# Patient Record
Sex: Male | Born: 1975 | Race: White | Hispanic: No | Marital: Married | Smoking: Never smoker
Health system: Southern US, Community
[De-identification: ages and names within clinical notes are randomized; demographics above are authoritative.]

---

## 2008-03-24 ENCOUNTER — Ambulatory Visit (HOSPITAL_BASED_OUTPATIENT_CLINIC_OR_DEPARTMENT_OTHER): Admission: RE | Admit: 2008-03-24 | Discharge: 2008-03-24 | Payer: Self-pay | Admitting: Orthopedic Surgery

## 2011-03-28 NOTE — Op Note (Signed)
NAMEJAIRE, PINKHAM              ACCOUNT NO.:  [id number]   MEDICAL RECORD NO.:  [id number]          PATIENT TYPE:  AMB   LOCATION:  DSC                          FACILITY:  [facility]   PHYSICIAN:  Katy Fitch. Sypher, M.D. DATE OF BIRTH:  1976-01-02   DATE OF PROCEDURE:  03/24/2008  DATE OF DISCHARGE:                               OPERATIVE REPORT   PREOPERATIVE DIAGNOSES:  1. 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  2. MRI proven rotator cuff tendinopathy.   POSTOPERATIVE DIAGNOSES:  1. 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  2. MRI proven rotator cuff tendinopathy.  3. Identification of significant acromioclavicular degenerative      change.  4. Unfavorable acromioclavicular anatomy.  5. Prominent anterior fraying of the bursal surface of the rotator      cuff.   OPERATION:  1. Examination of right shoulder under anesthesia.  2. Diagnostic arthroscopy of right glenohumeral joint.  3. Subacromial decompression with bursectomy, coracoacromial ligament      relaxation and acromioplasty.  4. Arthroscopic distal clavicle resection en bloc.   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  He has had a history of chronic right shoulder pain with  loss of motion and elevation, across the torso adduction and abduction.  He had a history of playing throwing sports throughout his secondary  school and college career and has had quite a bit of exposure to golf.   During the past months, he has had increasing pain, difficulty sleeping  on the shoulder at night, loss of range of motion and weakness of  abduction.   Clinical examination revealed signs of AC arthropathy, subacromial  impingement, sub AC joint impingement, and rotator cuff weakness.  An  MRI documented unfavorable AC anatomy, acromial  anatomy, and  tendinopathy of the rotator cuff.   After informed consent, he is brought to the operating room at this  time.   PROCEDURE:  Brick Ketcher is brought to the operating room and placed  in supine position on the operating table.   Following an anesthesia consult by Dr. Noreene Larsson, general anesthesia by  endotracheal technique was recommended as well as an interscalene block.   Dr. Noreene Larsson placed an interscalene block in the holding area without  complication.   Mr. Hocker was then brought to room 6, placed in supine position on the  operating table and under Dr. Morley Kos direct supervision, general  endotracheal anesthesia induced.   He was carefully positioned in the beach-chair position with the aid of  a torso and head holder designed for shoulder arthroscopy.   The entire right upper extremity forequarter was prepped with DuraPrep  and draped with impervious arthroscopy drapes.   The procedure commenced with examination of the shoulder under  anesthesia.  There was no evidence of instability.   The scope was then introduced through a standard posterior viewing  portal.  Diagnostic arthroscopy revealed intact hyaline articular  cartilage surfaces on the humeral  head and glenoid.  The long head of  the biceps origin was normal.  The labrum was normal.  The anterior  capsular ligaments were normal and the deep surface of the rotator cuff  including the subscapularis, supraspinatus, infraspinatus and teres  minor were all noted to be normal.  The inferior recess was normal.   The scope was removed from the glenohumeral joint and placed in the  subacromial space.   A florid bursitis was noted with multiple webs of hypertrophic bursa.  These were documented with a digital camera followed by removal with a  suction shaver.   The anatomy of the coracoacromial arch was studied.  There was  unfavorable AC anatomy with an inferiorly prominent distal clavicle and  an  inferior anterolateral acromion.   The coracoacromial ligament was relaxed with a cutting cautery followed  by leveling of the acromion to a type 1 morphology.  After bursectomy,  the distal clavicle was exposed and the distal 15 mm clavicle was  removed arthroscopically.  Hemostasis was achieved with bipolar cautery  followed by hemostasis of the cuff.  The rough area of the cuff beneath  the acromion and AC joint was then debrided to a smooth margin with a  suction shaver.  Photographic documentation of the rough tendon, as well  as the subsequent completed debridement was accomplished with a digital  camera.   The final decompression was documented with the camera as well.   Mr. Puig surgery was uncomplicated.  Hemostasis was achieved with  bipolar cautery followed by removal of the arthroscopic equipment and  repair of the portals with mattress sutures of 3-0 Prolene.   There were no apparent complications.   Mr. Janek tolerated the surgery and anesthesia well.  He was  transferred to the recovery room with stable vital signs.   He will be discharged to the care of his family with prescription for  Dilaudid 2 mg one to two tablets p.o. q 4-6 h. p.r.n. pain, #30 tablets  without refill.  Also, Motrin 600 mg one p.o. q.6 h. p.r.n. pain with  food with also one refill.  Also, Keflex 500 mg one p.o. q.8 h. x4 days  as a prophylactic antibiotic.      Katy Fitch Sypher, M.D.  Electronically Signed     RVS/MEDQ  D:  03/24/2008  T:  03/25/2008  Job:  [id number]

## 2016-08-04 ENCOUNTER — Other Ambulatory Visit: Payer: Self-pay | Admitting: Family Medicine

## 2016-08-04 DIAGNOSIS — R202 Paresthesia of skin: Principal | ICD-10-CM

## 2016-08-04 DIAGNOSIS — R200 Anesthesia of skin: Secondary | ICD-10-CM

## 2016-08-08 ENCOUNTER — Other Ambulatory Visit: Payer: Self-pay | Admitting: Family Medicine

## 2016-08-08 ENCOUNTER — Ambulatory Visit
Admission: RE | Admit: 2016-08-08 | Discharge: 2016-08-08 | Disposition: A | Discharge status: Home / Self Care | Payer: BLUE CROSS/BLUE SHIELD | Source: Ambulatory Visit | Attending: Family Medicine | Admitting: Family Medicine

## 2016-08-08 DIAGNOSIS — M542 Cervicalgia: Secondary | ICD-10-CM

## 2016-08-08 DIAGNOSIS — R200 Anesthesia of skin: Secondary | ICD-10-CM

## 2016-08-08 DIAGNOSIS — M25511 Pain in right shoulder: Secondary | ICD-10-CM

## 2016-08-08 DIAGNOSIS — R202 Paresthesia of skin: Secondary | ICD-10-CM

## 2016-08-09 ENCOUNTER — Other Ambulatory Visit: Payer: Self-pay

## 2016-08-20 ENCOUNTER — Other Ambulatory Visit: Payer: BLUE CROSS/BLUE SHIELD

## 2016-08-25 ENCOUNTER — Ambulatory Visit
Admission: RE | Admit: 2016-08-25 | Discharge: 2016-08-25 | Disposition: A | Discharge status: Home / Self Care | Payer: BLUE CROSS/BLUE SHIELD | Source: Ambulatory Visit | Attending: Family Medicine | Admitting: Family Medicine

## 2016-08-25 ENCOUNTER — Other Ambulatory Visit: Payer: Self-pay | Admitting: Family Medicine

## 2016-08-25 DIAGNOSIS — R202 Paresthesia of skin: Principal | ICD-10-CM

## 2016-08-25 DIAGNOSIS — M7541 Impingement syndrome of right shoulder: Secondary | ICD-10-CM

## 2016-08-25 DIAGNOSIS — R200 Anesthesia of skin: Secondary | ICD-10-CM

## 2016-11-08 ENCOUNTER — Other Ambulatory Visit: Payer: Self-pay | Admitting: Neurosurgery

## 2016-11-08 DIAGNOSIS — M5023 Other cervical disc displacement, cervicothoracic region: Secondary | ICD-10-CM

## 2016-11-10 ENCOUNTER — Ambulatory Visit
Admission: RE | Admit: 2016-11-10 | Discharge: 2016-11-10 | Disposition: A | Discharge status: Home / Self Care | Payer: BLUE CROSS/BLUE SHIELD | Source: Ambulatory Visit | Attending: Neurosurgery | Admitting: Neurosurgery

## 2016-11-10 DIAGNOSIS — M5023 Other cervical disc displacement, cervicothoracic region: Secondary | ICD-10-CM

## 2016-11-10 MED ORDER — METHYLPREDNISOLONE ACETATE 40 MG/ML INJ SUSP (RADIOLOG
120.0000 mg | Freq: Once | INTRAMUSCULAR | Status: AC
  Administered 2016-11-10: 120 mg via EPIDURAL

## 2016-11-10 MED ORDER — IOPAMIDOL (ISOVUE-M 200) INJECTION 41%
1.0000 mL | Freq: Once | INTRAMUSCULAR | Status: AC
  Administered 2016-11-10: 1 mL via EPIDURAL

## 2016-11-10 NOTE — Discharge Instructions (Signed)

## 2016-12-14 ENCOUNTER — Other Ambulatory Visit: Payer: Self-pay | Admitting: Neurosurgery

## 2016-12-14 DIAGNOSIS — M25511 Pain in right shoulder: Secondary | ICD-10-CM

## 2016-12-18 ENCOUNTER — Ambulatory Visit
Admission: RE | Admit: 2016-12-18 | Discharge: 2016-12-18 | Disposition: A | Discharge status: Home / Self Care | Payer: BLUE CROSS/BLUE SHIELD | Source: Ambulatory Visit | Attending: Neurosurgery | Admitting: Neurosurgery

## 2016-12-18 DIAGNOSIS — M25511 Pain in right shoulder: Secondary | ICD-10-CM

## 2017-05-16 IMAGING — MR MR CERVICAL SPINE W/O CM
4 of 5 series · 28 of 48 positions shown · non-contrast
Comparison: None.

CLINICAL DATA: Right-sided neck pain. Shoulder pain and triceps
pain.

EXAM:
MRI CERVICAL SPINE WITHOUT CONTRAST
TECHNIQUE: Multiplanar, multisequence MR imaging of the cervical spine was
performed. No intravenous contrast was administered.

[Series 6: T1 · sagittal · 3.0mm · 0.66mm/px · 7 of 15 slices shown]
[im 1/15]
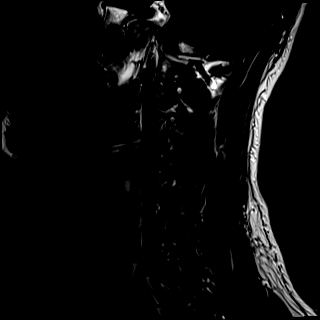
[im 3/15]
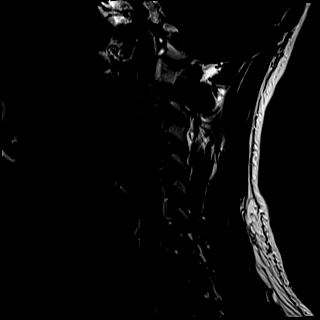
[im 5/15]
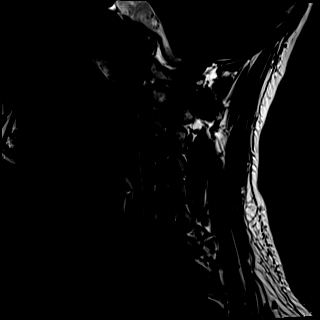
[im 8/15]
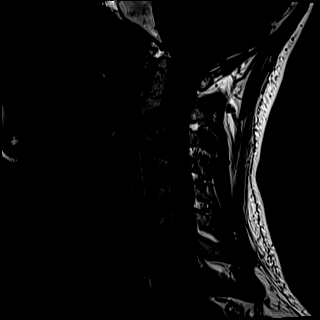
[im 10/15]
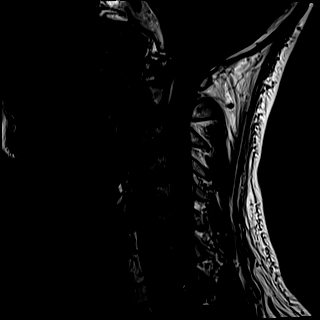
[im 12/15]
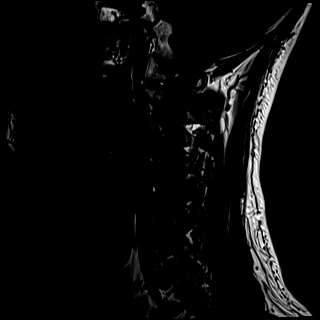
[im 15/15]
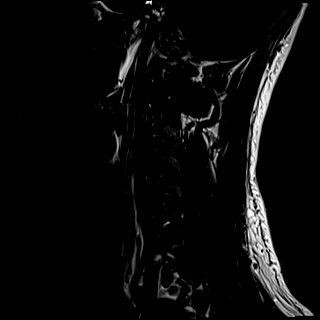

[Series 7: T2 · sagittal · 3.0mm · 0.55mm/px · 7 of 15 slices shown (1 of 2)]
[im 1/15]
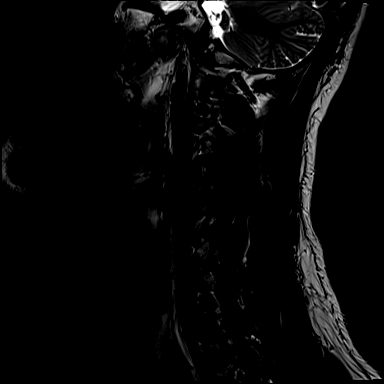
[im 3/15]
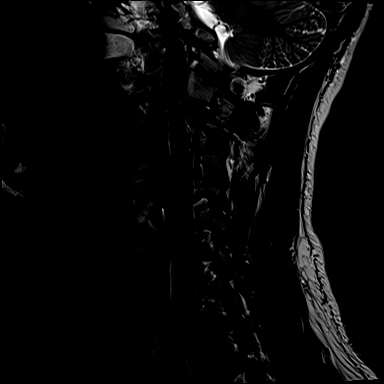
[im 5/15]
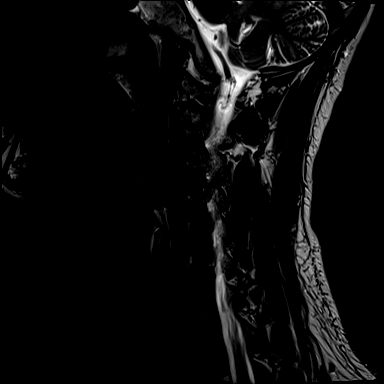
[im 8/15]
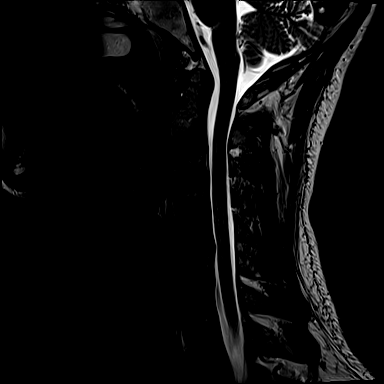
[im 10/15]
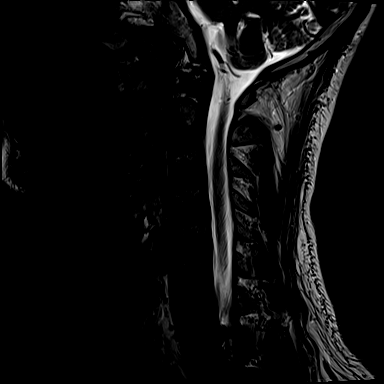
[im 12/15]
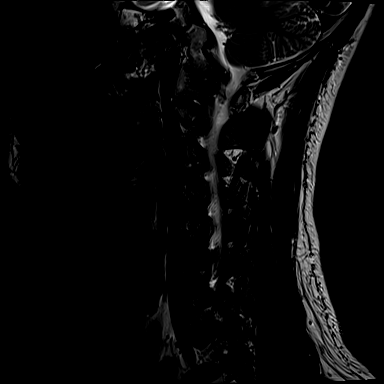
[im 15/15]
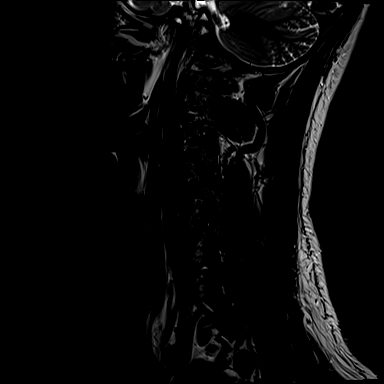

[Series 8: STIR · sagittal · 3.0mm · 0.33mm/px · 6 of 15 slices shown]
[im 1/15]
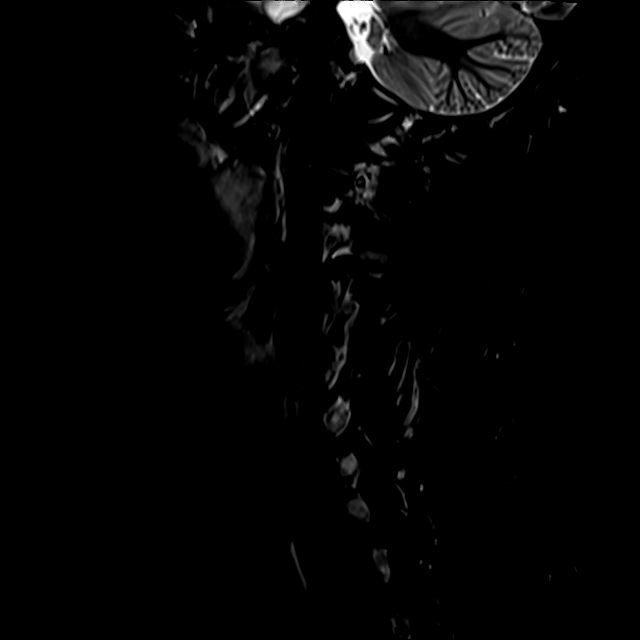
[im 3/15]
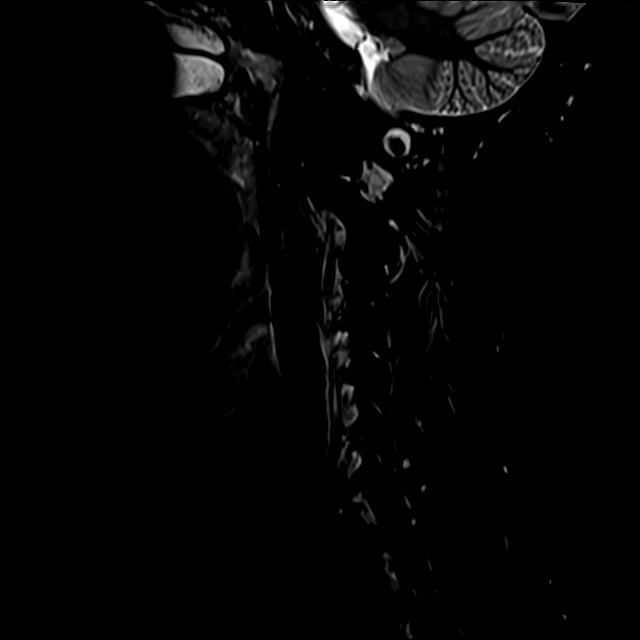
[im 6/15]
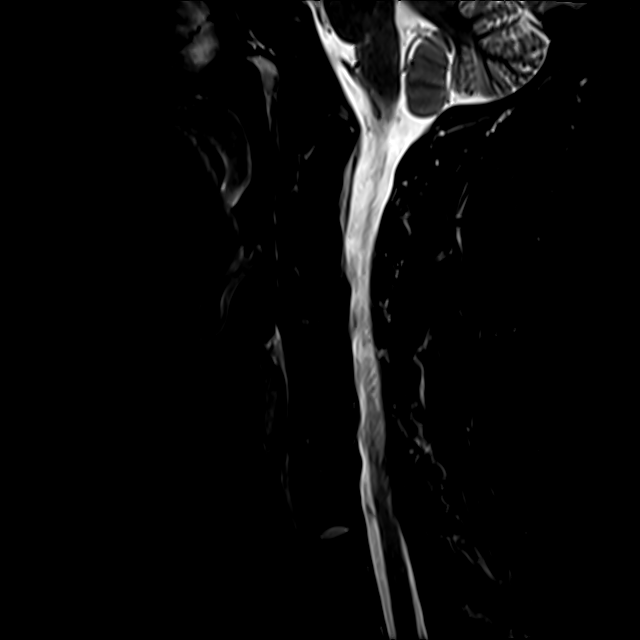
[im 9/15]
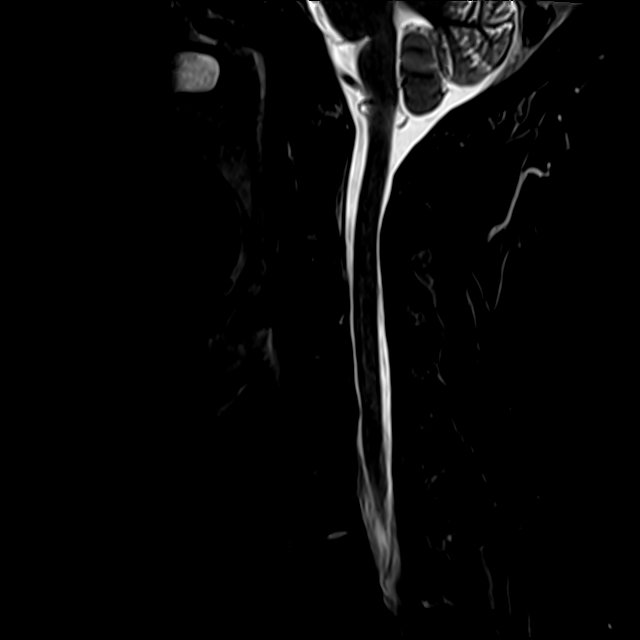
[im 12/15]
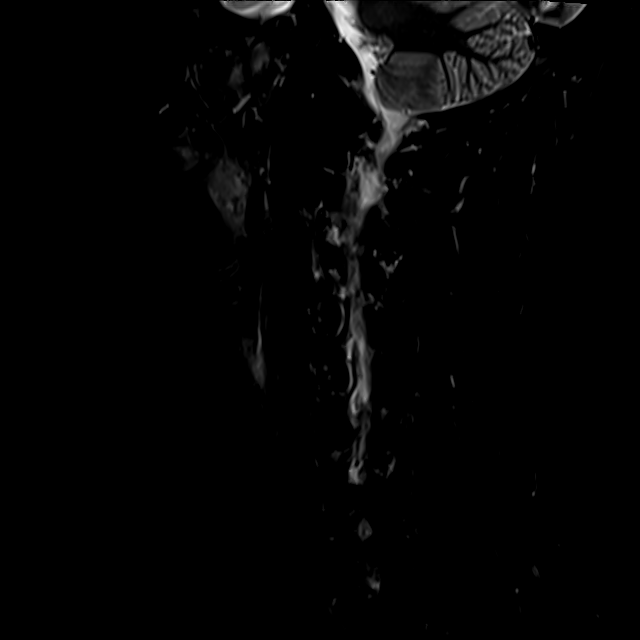
[im 15/15]
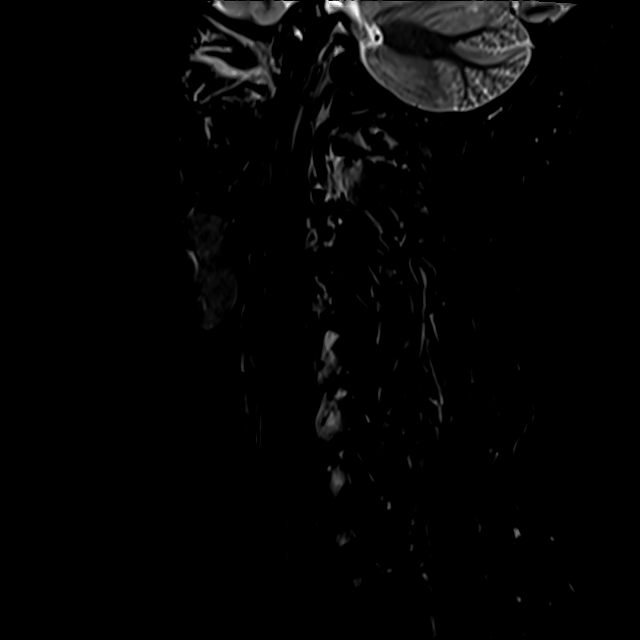

[Series 9: T2 · axial · 3.0mm · 0.50mm/px · z∈[-51,+62]mm · 8 of 33 slices shown (2 of 2)]
[im 1/33]
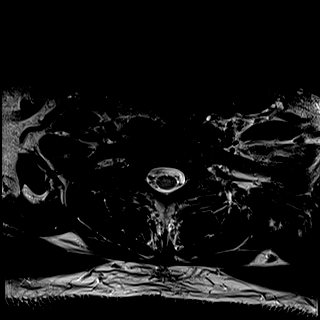
[im 5/33]
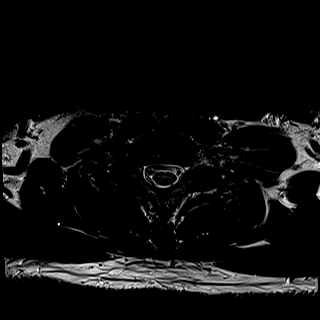
[im 10/33]
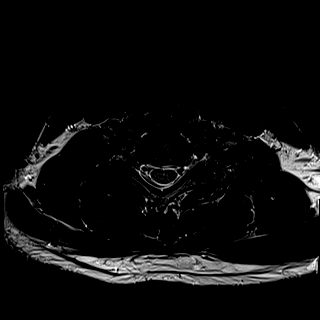
[im 15/33]
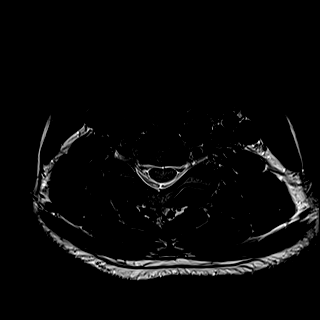
[im 18/33]
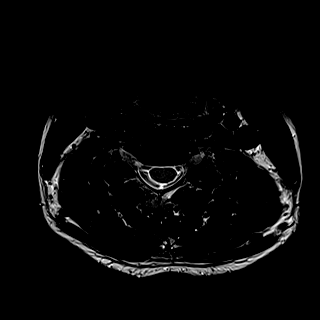
[im 23/33]
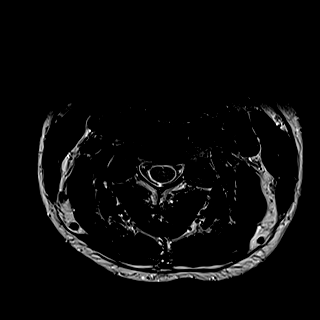
[im 28/33]
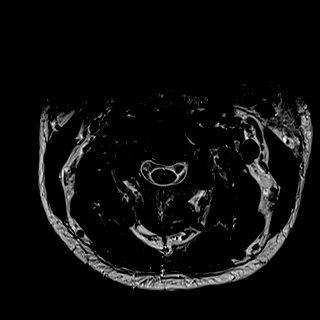
[im 33/33]
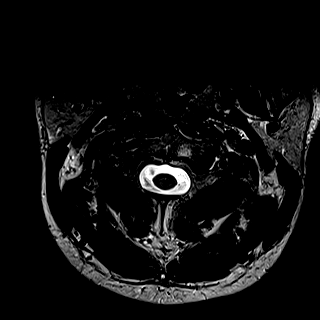

[28 of 48 positions shown; findings below may reference images not displayed]

FINDINGS: Alignment: Physiologic.

Vertebrae: No fracture, evidence of discitis, or bone lesion.

Cord: Normal signal and morphology.

Posterior Fossa, vertebral arteries, paraspinal tissues: Negative.

Disc levels:

Discs: Disc spaces are preserved.

C2-3: No significant disc bulge. No neural foraminal stenosis. No
central canal stenosis.

C3-4: No significant disc bulge. No neural foraminal stenosis. No
central canal stenosis.

C4-5: No significant disc bulge. No neural foraminal stenosis. No
central canal stenosis.

C5-6: Broad-based disc bulge with a small central disc protrusion.
No neural foraminal stenosis. No central canal stenosis.

C6-7: No significant disc bulge. No neural foraminal stenosis. No
central canal stenosis.

C7-T1: No significant disc bulge. No neural foraminal stenosis. No
central canal stenosis.
IMPRESSION: 1. At C5-6 there is a broad-based disc bulge with a small central
disc protrusion without foraminal or central canal stenosis.

## 2020-12-13 ENCOUNTER — Ambulatory Visit: Payer: PRIVATE HEALTH INSURANCE | Attending: Physical Medicine and Rehabilitation | Admitting: Physical Therapy

## 2020-12-13 ENCOUNTER — Other Ambulatory Visit: Payer: Self-pay

## 2020-12-13 ENCOUNTER — Encounter: Payer: Self-pay | Admitting: Physical Therapy

## 2020-12-13 DIAGNOSIS — M545 Low back pain, unspecified: Secondary | ICD-10-CM | POA: Insufficient documentation

## 2020-12-13 DIAGNOSIS — G8929 Other chronic pain: Secondary | ICD-10-CM | POA: Diagnosis present

## 2020-12-13 DIAGNOSIS — M25511 Pain in right shoulder: Secondary | ICD-10-CM | POA: Diagnosis present

## 2020-12-13 DIAGNOSIS — M542 Cervicalgia: Secondary | ICD-10-CM | POA: Diagnosis present

## 2020-12-13 NOTE — Patient Instructions (Signed)
Access Code: [email or web address] URL: [email or web address] Date: 12/13/2020 Prepared by: Dwana Curd  Exercises Seated Shoulder W External Rotation on Swiss Ball - 1 x daily - 7 x weekly - 3 sets - 10 reps  Patient Education Office Posture

## 2020-12-13 NOTE — Therapy (Addendum)
[facility] [address], [address] [place], [place], [address] Phone: [phone]   Fax:  [phone]  Physical Therapy Evaluation  Patient Details  Name: Keith Montoya MRN: [id number] Date of Birth: 1976/10/23 Referring Provider (PT): Romero Belling, MD   Encounter Date: 12/13/2020   PT End of Session - 12/13/20 2031    Visit Number 1    Date for PT Re-Evaluation 02/07/21    Authorization Type UHC    PT Start Time 1620    PT Stop Time 1705    PT Time Calculation (min) 45 min    Activity Tolerance Patient tolerated treatment well    Behavior During Therapy [facility] for tasks assessed/performed           History reviewed. No pertinent past medical history.  History reviewed. No pertinent surgical history.  There were no vitals filed for this visit.    Subjective Assessment - 12/13/20 1629    Subjective Pt has pain Rt shoulder with overhead like serving when playing tennis.  Pain in hip/low back when standing    Limitations Standing;Other (comment)    Diagnostic tests --    Currently in Pain? Yes    Pain Score 3     Pain Location Neck    Pain Orientation Right    Pain Descriptors / Indicators Nagging;Discomfort    Pain Type Chronic pain    Pain Onset More than a month ago    Pain Frequency Intermittent    Aggravating Factors  overhead and working and sitting aggravates the shoulder, standing is worst for the hip    Pain Relieving Factors sitting    Effect of Pain on Daily Activities standing and overhead activities tennis    Multiple Pain Sites No              [facility] PT Assessment - 12/13/20 0001      Assessment   Medical Diagnosis rt shoulder pain w/ supraspinatus tendinosis, cervicalagia, and left lumbar spondylolysis    Referring Provider (PT) Romero Belling, MD    Onset Date/Surgical Date --   ongoing   Hand Dominance Right    Prior Therapy a few years ago      Precautions   Precautions None       Balance Screen   Has the patient fallen in the past 6 months No      Home Environment   Living Environment Private residence    Living Arrangements Spouse/significant other;Children   2 children 5 and 8     Prior Function   Level of Independence Independent    Vocation Full time employment    Vocation Requirements sitting      Cognition   Overall Cognitive Status Within Functional Limits for tasks assessed      Observation/Other Assessments   Observations sits with rounded back and feet tucked under the chair    Focus on Therapeutic Outcomes (FOTO)  59% function intake; goal is 69% function      Posture/Postural Control   Posture/Postural Control Postural limitations    Postural Limitations Rounded Shoulders;Forward head;Anterior pelvic tilt   shoulder girdle slightly rotated left     ROM / Strength   AROM / PROM / Strength AROM;PROM;Strength      AROM   Overall AROM Comments fwd flex lumbar 80% limited by hamsrtring tight    AROM Assessment Site Cervical    Cervical - Right Side Bend 50    Cervical - Left Side Bend 60  Cervical - Right Rotation 50    Cervical - Left Rotation 50      PROM   Overall PROM Comments Rt IR 70%      Strength   Overall Strength Comments bilat UE 5/5      Palpation   Palpation comment upper traps and scalenes tighter on Rt side; TTP posterior Rt shoulder and stiff glenohumeral joint Rt side, tight lumbar paraspinals palpated in standing      Special Tests    Special Tests Cervical;Rotator Cuff Impingement    Cervical Tests Spurling's    Rotator Cuff Impingment tests Neer impingement test;Hawkins- Kennedy test      Spurling's   Findings Positive    Side Right    Comment "feel a lot of pressure at base of neck      Neer Impingement test    Findings Negative      Hawkins-Kennedy test   Findings Negative                      Objective measurements completed on examination: See above findings.       [facility] Adult PT  Treatment/Exercise - 12/13/20 0001      Self-Care   Self-Care Other Self-Care Comments    Other Self-Care Comments  educated and demo correct sitting posture; shoulder er with green band                  PT Education - 12/13/20 2122    Education Details Access Code: [email or web address]    Person(s) Educated Patient    Methods Explanation;Demonstration;Handout;Verbal cues    Comprehension Verbalized understanding;Returned demonstration            PT Short Term Goals - 12/13/20 2037      PT SHORT TERM GOAL #1   Title able to demonstrate and report he can maintain good posture at his desk when at work at least 35% of the time    Time 4    Period Weeks    Status New    Target Date 01/10/21      PT SHORT TERM GOAL #2   Title Pt will report 25% less pain in neck and shoulder and in back when standing for long periods of time    Time 4    Period Weeks    Status New    Target Date 01/10/21             PT Long Term Goals - 12/13/20 2012      PT LONG TERM GOAL #1   Title pt will be able to return to playing tennis due to imrpoved scpular and shoulder stability    Time 8    Period Weeks    Status New    Target Date 02/07/21      PT LONG TERM GOAL #2   Title pt will report 70% less pain when standing so he can attend events for his kids    Time 8    Period Weeks    Status New    Target Date 02/07/21      PT LONG TERM GOAL #3   Title Pt will be ind with advanced HEP for pain management and strength routine to maintain his activity level    Time 8    Period Weeks    Status New    Target Date 02/07/21      PT LONG TERM GOAL #4   Title Pt will report he can work  normal work day without feeling any neck or shoulder discomfort    Time 12    Period Weeks    Status New    Target Date 02/07/21                  Plan - 12/13/20 2042    Clinical Impression Statement Pt presents to clinic due to chronic Rt shoulder pain and stiffness that has been getting worse  recently.  He is no longer playing tennis due to pain with serving and overhead activities.  Pt is active on the weekends but sits a lot during the week at work.  Pt's posture as he demonstrates reveals tight pectoralis region with rounded shoulders and anterior pelvic tilt with tight lumbar paraspinals. He has a left slight rotation of the shoulder girdle in standing.  Posterior deltoid insertion is TTP and tricep on the Rt side.  Rt upper trap and scalenes are tight.  Rt glenohumoral joint is stiff anteriorly.  He is negative Leanord Asal and Neer impingement tests, but has a mildly positive Spurlings for some discomfort but no radicular symptoms.  Pt will benefit from skilled PT to address muscle tension and pain management as well as strengthening and stability for improved posture and functional movements without increased pain so he can return to full function.    Personal Factors and Comorbidities Time since onset of injury/illness/exacerbation;Profession;Comorbidity 2    Comorbidities subacromial bone spur removal; ulnar nerve surgery Rt side    Examination-Activity Limitations Lift;Stand    Examination-Participation Restrictions Community Activity;Occupation    Stability/Clinical Decision Making Evolving/Moderate complexity    Clinical Decision Making Moderate    Rehab Potential Excellent    PT Frequency 2x / week    PT Duration 6 weeks    PT Treatment/Interventions ADLs/Self Care Home Management;Biofeedback;Cryotherapy;Electrical Stimulation;Iontophoresis 4mg /ml Dexamethasone;Moist Heat;Traction;Ultrasound;Therapeutic activities;Neuromuscular re-education;Therapeutic exercise;Patient/family education;Manual techniques;Passive range of motion;Dry needling;Taping    PT Next Visit Plan f/u on posture at desk; dry needling to cervical and lumbar; progress scapular stability and lifting exercises; hamstring stretch    PT Home Exercise Plan Access Code: [email or web address]    Consulted and Agree with Plan  of Care Patient           Patient will benefit from skilled therapeutic intervention in order to improve the following deficits and impairments:  Increased muscle spasms,Decreased strength,Postural dysfunction,Impaired UE functional use,Decreased range of motion,Pain  Visit Diagnosis: Cervicalgia  Chronic right shoulder pain  Chronic left-sided low back pain, unspecified whether sciatica present     Problem List There are no problems to display for this patient.   , PT 12/13/2020, 9:22 PM  [facility] [address], [address] [place], [place], [place] Phone: [phone]   Fax:  [phone]  Name: Keith Montoya MRN: Keith Montoya Date of Birth: 1976/06/13

## 2020-12-14 NOTE — Addendum Note (Signed)
Addended by: Beatris Si on: 12/14/2020 08:50 AM   Modules accepted: Orders

## 2020-12-24 ENCOUNTER — Other Ambulatory Visit: Payer: Self-pay

## 2020-12-24 ENCOUNTER — Ambulatory Visit (HOSPITAL_BASED_OUTPATIENT_CLINIC_OR_DEPARTMENT_OTHER): Payer: 59 | Attending: Physical Medicine and Rehabilitation | Admitting: Physical Therapy

## 2020-12-24 DIAGNOSIS — M545 Low back pain, unspecified: Secondary | ICD-10-CM | POA: Diagnosis present

## 2020-12-24 DIAGNOSIS — M25511 Pain in right shoulder: Secondary | ICD-10-CM | POA: Insufficient documentation

## 2020-12-24 DIAGNOSIS — G8929 Other chronic pain: Secondary | ICD-10-CM | POA: Diagnosis present

## 2020-12-24 DIAGNOSIS — M542 Cervicalgia: Secondary | ICD-10-CM | POA: Insufficient documentation

## 2020-12-24 NOTE — Therapy (Signed)
[facility] [address] [place], [place], [address] Phone: [phone]   Fax:  [phone]  Physical Therapy Treatment  Patient Details  Name: Keith Montoya MRN: [id number] Date of Birth: [birth year] Referring Provider (PT): Romero Belling, MD   Encounter Date: 12/24/2020   PT End of Session - 12/24/20 0759    Visit Number 2    Number of Visits 13    Date for PT Re-Evaluation 02/07/21    Authorization Type UHC    PT Start Time 0800    PT Stop Time 0845    PT Time Calculation (min) 45 min    Activity Tolerance Patient tolerated treatment well    Behavior During Therapy [facility] for tasks assessed/performed           No past medical history on file.  No past surgical history on file.  There were no vitals filed for this visit.   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 Pt states the towel in his low back did improve.    Limitations Standing;Other (comment)    Currently in Pain? Yes    Pain Score 2     Pain Location Neck    Pain Orientation Right    Pain Descriptors / Indicators Tightness    Pain Radiating Towards Posterior shoulder    Pain Onset More than a month ago                             [facility] Adult PT Treatment/Exercise - 12/24/20 0001      Exercises   Exercises Lumbar      Lumbar Exercises: Stretches   Single Knee to Chest Stretch Right;Left;30 seconds    Piriformis Stretch Right;Left;30 seconds      Lumbar Exercises: Supine   Pelvic Tilt 5 reps    Bridge 20 reps;3 seconds   with PPT   Other Supine Lumbar Exercises Child's pose x30 sec, side bend x30 sec each side    Other Supine Lumbar Exercises Cat cow x 10 reps with 2-3 sec hold      Shoulder Exercises: Supine   Flexion 10 reps;AROM    Flexion Limitations Working on scapular stability without increased lumbar lordosis      Shoulder Exercises: Sidelying    External Rotation Strengthening;Right;20 reps;Weights    External Rotation Weight (lbs) 3 lbs    Flexion --    Other Sidelying Exercises Sleeper stretch x30 sec      Manual Therapy   Manual Therapy Joint mobilization;Soft tissue mobilization    Joint Mobilization posterior, inferior and anterior GH grade II to III mobs    Soft tissue mobilization TPR & STW upper trap, scalene, lats                    PT Short Term Goals - 12/13/20 2037      PT SHORT TERM GOAL #1   Title able to demonstrate and report he can maintain good posture at his desk when at work at least 35% of the time    Time 4    Period Weeks    Status New    Target Date 01/10/21      PT SHORT TERM GOAL #2   Title Pt will report 25% less pain in neck and shoulder and in back when standing for long periods of time  Time 4    Period Weeks    Status New    Target Date 01/10/21             PT Long Term Goals - 12/13/20 2012      PT LONG TERM GOAL #1   Title pt will be able to return to playing tennis due to imrpoved scpular and shoulder stability    Time 8    Period Weeks    Status New    Target Date 02/07/21      PT LONG TERM GOAL #2   Title pt will report 70% less pain when standing so he can attend events for his kids    Time 8    Period Weeks    Status New    Target Date 02/07/21      PT LONG TERM GOAL #3   Title Pt will be ind with advanced HEP for pain management and strength routine to maintain his activity level    Time 8    Period Weeks    Status New    Target Date 02/07/21      PT LONG TERM GOAL #4   Title Pt will report he can work normal work day without feeling any neck or shoulder discomfort    Time 12    Period Weeks    Status New    Target Date 02/07/21                 Plan - 12/24/20 1130    Clinical Impression Statement Treatment session focused on shoulder stretching, improving scapular mobility without low back compensation, and initiating low back/core  stabilization exercises. Pt appears to have tight lats that effects low back/increases lumbar lordosis when pt attempting scapular retraction. Manual therapy provided to address upper trap, scalene, posterior delt/lats.    Personal Factors and Comorbidities Time since onset of injury/illness/exacerbation;Profession;Comorbidity 2    Comorbidities subacromial bone spur removal; ulnar nerve surgery Rt side    Examination-Activity Limitations Lift;Stand    Examination-Participation Restrictions Community Activity;Occupation    Stability/Clinical Decision Making Evolving/Moderate complexity    Rehab Potential Excellent    PT Frequency 2x / week    PT Duration 6 weeks    PT Treatment/Interventions ADLs/Self Care Home Management;Biofeedback;Cryotherapy;Electrical Stimulation;Iontophoresis 4mg /ml Dexamethasone;Moist Heat;Traction;Ultrasound;Therapeutic activities;Neuromuscular re-education;Therapeutic exercise;Patient/family education;Manual techniques;Passive range of motion;Dry needling;Taping    PT Next Visit Plan manual therapy to cervical and lumbar; progress scapular stability and lifting exercises; hamstring/lumbar stretching    PT Home Exercise Plan Access Code: [email or web address]    Consulted and Agree with Plan of Care Patient           Patient will benefit from skilled therapeutic intervention in order to improve the following deficits and impairments:  Increased muscle spasms,Decreased strength,Postural dysfunction,Impaired UE functional use,Decreased range of motion,Pain  Visit Diagnosis: Cervicalgia  Chronic right shoulder pain  Chronic left-sided low back pain, unspecified whether sciatica present     Problem List There are no problems to display for this patient.   [facility] [address] PT, DPT 12/24/2020, 11:44 AM  [facility] [address] [place], [place], [place] Phone: [phone]   Fax:  [phone]  Name: Rhone Ozaki MRN: [name] Date of Birth: 24-May-1976

## 2020-12-28 ENCOUNTER — Ambulatory Visit (HOSPITAL_BASED_OUTPATIENT_CLINIC_OR_DEPARTMENT_OTHER): Payer: PRIVATE HEALTH INSURANCE | Attending: Physical Medicine and Rehabilitation | Admitting: Physical Therapy

## 2020-12-28 ENCOUNTER — Other Ambulatory Visit: Payer: Self-pay

## 2020-12-28 DIAGNOSIS — M542 Cervicalgia: Secondary | ICD-10-CM | POA: Diagnosis present

## 2020-12-28 DIAGNOSIS — M545 Low back pain, unspecified: Secondary | ICD-10-CM | POA: Diagnosis not present

## 2020-12-28 DIAGNOSIS — M25511 Pain in right shoulder: Secondary | ICD-10-CM | POA: Diagnosis not present

## 2020-12-28 DIAGNOSIS — G8929 Other chronic pain: Secondary | ICD-10-CM | POA: Diagnosis not present

## 2020-12-28 NOTE — Therapy (Signed)
[facility] [address] [place], [place], [address] Phone: [phone]   Fax:  [phone]  Physical Therapy Treatment  Patient Details  Name: Keith Montoya MRN: [id number] Date of Birth: 1976/04/07 Referring Provider (PT): Romero Belling, MD   Encounter Date: 12/28/2020   PT End of Session - 12/28/20 1654    Visit Number 3    Number of Visits 13    Date for PT Re-Evaluation 02/07/21    Authorization Type UHC    PT Start Time 1607    PT Stop Time 1650    PT Time Calculation (min) 43 min    Activity Tolerance Patient tolerated treatment well    Behavior During Therapy [facility] for tasks assessed/performed           No past medical history on file.  No past surgical history on file.  There were no vitals filed for this visit.   Subjective Assessment - 12/28/20 1610    Subjective Pt states he played pick up basketball and had an exacerbation of his left side with increased tightness and soreness. Pt states this afternoon feels a little better.    Limitations Standing;Other (comment)    Currently in Pain? Yes    Pain Score 7     Pain Location Back    Pain Orientation Left    Pain Descriptors / Indicators Tightness;Sore    Pain Type Chronic pain    Pain Onset More than a month ago                             [facility] Adult PT Treatment/Exercise - 12/28/20 0001      Posture/Postural Control   Postural Limitations Anterior pelvic tilt;Left pelvic obliquity      Lumbar Exercises: Stretches   Piriformis Stretch Right;Left;30 seconds    Other Lumbar Stretch Exercise Toe touch stretch 2x20 sec      Lumbar Exercises: Standing   Shoulder Extension Strengthening;5 reps    Shoulder Extension Limitations Attempted; however, pt with increased L hip pain with standing    Other Standing Lumbar Exercises Lateral shift x10 to L    Other Standing Lumbar Exercises Lumbar extensions x10      Lumbar Exercises:  Seated   Other Seated Lumbar Exercises Row 25# 2x10      Lumbar Exercises: Supine   Pelvic Tilt 10 reps    Clam --   3x10 sec hold isometrics   Bridge 10 reps;3 seconds      Manual Therapy   Manual Therapy Joint mobilization;Muscle Energy Technique    Joint Mobilization manual posterior rotation of L innominate and sacral distraction    Muscle Energy Technique encouraging posterior rotation of L innominate                    PT Short Term Goals - 12/13/20 2037      PT SHORT TERM GOAL #1   Title able to demonstrate and report he can maintain good posture at his desk when at work at least 35% of the time    Time 4    Period Weeks    Status New    Target Date 01/10/21      PT SHORT TERM GOAL #2   Title Pt will report 25% less pain in neck and shoulder and in back when standing for long periods of time    Time 4    Period Weeks  Status New    Target Date 01/10/21             PT Long Term Goals - 12/13/20 2012      PT LONG TERM GOAL #1   Title pt will be able to return to playing tennis due to imrpoved scpular and shoulder stability    Time 8    Period Weeks    Status New    Target Date 02/07/21      PT LONG TERM GOAL #2   Title pt will report 70% less pain when standing so he can attend events for his kids    Time 8    Period Weeks    Status New    Target Date 02/07/21      PT LONG TERM GOAL #3   Title Pt will be ind with advanced HEP for pain management and strength routine to maintain his activity level    Time 8    Period Weeks    Status New    Target Date 02/07/21      PT LONG TERM GOAL #4   Title Pt will report he can work normal work day without feeling any neck or shoulder discomfort    Time 12    Period Weeks    Status New    Target Date 02/07/21                 Plan - 12/28/20 1701    Clinical Impression Statement Pt returns to PT with increased L back/hip pain/soreness after playing basketball. Pt found to have asymmetric  pelvic height (L higher than R in standing); shortened L LE in supine and then symmetrical with R LE with long sitting. Treatment focused on realigning pelvis and improving lumbar/SI joint flexibility and stability. Pt appeared to respond well to McKenzie extension and lateral shift exercises -- PT providing a trial course. Reviewed stretches for R shoulder and reiterated stretching his lats and decreasing lumbar lordosis with shoulder/scapular exercises.    Personal Factors and Comorbidities Time since onset of injury/illness/exacerbation;Profession;Comorbidity 2    Comorbidities subacromial bone spur removal; ulnar nerve surgery Rt side    Examination-Activity Limitations Lift;Stand    Examination-Participation Restrictions Community Activity;Occupation    Stability/Clinical Decision Making Evolving/Moderate complexity    Rehab Potential Excellent    PT Frequency 2x / week    PT Duration 6 weeks    PT Treatment/Interventions ADLs/Self Care Home Management;Biofeedback;Cryotherapy;Electrical Stimulation;Iontophoresis 4mg /ml Dexamethasone;Moist Heat;Traction;Ultrasound;Therapeutic activities;Neuromuscular re-education;Therapeutic exercise;Patient/family education;Manual techniques;Passive range of motion;Dry needling;Taping    PT Next Visit Plan How was McKenzie type extensions? Continue to address pelvic instability. manual therapy to cervical and lumbar; progress scapular stability and lifting exercises; hamstring/lumbar stretching. Consider quadruped (bird dog, reverse bird dog, etc.) or sitting on physioball    PT Home Exercise Plan Access Code: [email or web address]    Consulted and Agree with Plan of Care Patient           Patient will benefit from skilled therapeutic intervention in order to improve the following deficits and impairments:  Increased muscle spasms,Decreased strength,Postural dysfunction,Impaired UE functional use,Decreased range of motion,Pain  Visit Diagnosis: Cervicalgia  Chronic  right shoulder pain  Chronic left-sided low back pain, unspecified whether sciatica present     Problem List There are no problems to display for this patient.   [facility] [address] PT, DPT 12/28/2020, 5:11 PM  [facility] [address] [place], [place], [place] Phone: [phone]   Fax:  [phone]  Name: Keith Montoya MRN: [id number] Date of Birth: [birth year]

## 2020-12-31 ENCOUNTER — Other Ambulatory Visit: Payer: Self-pay

## 2020-12-31 ENCOUNTER — Ambulatory Visit (HOSPITAL_BASED_OUTPATIENT_CLINIC_OR_DEPARTMENT_OTHER): Payer: PRIVATE HEALTH INSURANCE | Attending: Physical Medicine and Rehabilitation | Admitting: Physical Therapy

## 2020-12-31 DIAGNOSIS — M545 Low back pain, unspecified: Secondary | ICD-10-CM | POA: Diagnosis present

## 2020-12-31 DIAGNOSIS — G8929 Other chronic pain: Secondary | ICD-10-CM | POA: Diagnosis present

## 2020-12-31 DIAGNOSIS — M542 Cervicalgia: Secondary | ICD-10-CM | POA: Insufficient documentation

## 2020-12-31 DIAGNOSIS — M25511 Pain in right shoulder: Secondary | ICD-10-CM | POA: Diagnosis present

## 2020-12-31 NOTE — Therapy (Signed)
[place] [address] [place], [place], [address] Phone: [phone]   Fax:  [phone]  Physical Therapy Treatment  Patient Details  Name: Keith Montoya MRN: [id number] Date of Birth: [birth year] Referring Provider (PT): Keith Apple, MD   Encounter Date: 12/31/2020   PT End of Session - 12/31/20 1709    Visit Number 4    Number of Visits 13    Date for PT Re-Evaluation 02/07/21    Authorization Type UHC    PT Start Time 2924    PT Stop Time 1645    PT Time Calculation (min) 50 min    Activity Tolerance Patient tolerated treatment well    Behavior During Therapy [facility] for tasks assessed/performed           No past medical history on file.  No past surgical history on file.  There were no vitals filed for this visit.   Subjective Assessment - 12/31/20 1701    Subjective Pt reports he has had continued flare up of his L hip and L lateral leg. Pt also feels his R shoulder tightened up as well. Pt has attempted to use his massage gun to get trigger points along line of his IT band.    Limitations Standing;Other (comment)    Currently in Pain? Yes    Pain Score 7     Pain Location Hip    Pain Orientation Left    Pain Descriptors / Indicators Tightness;Sore    Pain Type Chronic pain    Pain Onset More than a month ago                             [facility] Adult PT Treatment/Exercise - 12/31/20 0001      Lumbar Exercises: Stretches   Hip Flexor Stretch Left;60 seconds   Thomas stretch while performing manual therapy     Lumbar Exercises: Supine   Pelvic Tilt 10 reps    Other Supine Lumbar Exercises Marching x10 bilat    Other Supine Lumbar Exercises Deadbug with alternating arm movement 2x10   Difficulty maintaining PPT with R shoulder flexion & L hip march     Lumbar Exercises: Sidelying   Hip Abduction Left;10 reps      Manual Therapy   Manual therapy comments IASTM L ITB    Soft tissue  mobilization TPR L vastus lateralis underneath ITB, TFL and glute max    Muscle Energy Technique MET for TFL                  PT Education - 12/31/20 1702    Education Details Discussed IT band stretches, hip flexor stretches, and hip compensation with core weakness. Discussed using weight lifting belt or SI belt for added core stability when he performs leisure activities such as pickleball and basketball.    Person(s) Educated Patient    Methods Explanation;Demonstration    Comprehension Verbalized understanding;Returned demonstration            PT Short Term Goals - 12/13/20 2037      PT SHORT TERM GOAL #1   Title able to demonstrate and report he can maintain good posture at his desk when at work at least 35% of the time    Time 4    Period Weeks    Status New    Target Date 01/10/21      PT SHORT TERM GOAL #2   Title Pt will  report 25% less pain in neck and shoulder and in back when standing for long periods of time    Time 4    Period Weeks    Status New    Target Date 01/10/21             PT Long Term Goals - 12/13/20 2012      PT LONG TERM GOAL #1   Title pt will be able to return to playing tennis due to imrpoved scpular and shoulder stability    Time 8    Period Weeks    Status New    Target Date 02/07/21      PT LONG TERM GOAL #2   Title pt will report 70% less pain when standing so he can attend events for his kids    Time 8    Period Weeks    Status New    Target Date 02/07/21      PT LONG TERM GOAL #3   Title Pt will be ind with advanced HEP for pain management and strength routine to maintain his activity level    Time 8    Period Weeks    Status New    Target Date 02/07/21      PT LONG TERM GOAL #4   Title Pt will report he can work normal work day without feeling any neck or shoulder discomfort    Time 12    Period Weeks    Status New    Target Date 02/07/21                 Plan - 12/31/20 1704    Clinical Impression  Statement Treatment focused on addressing pt's L hip pain with manual therapy as needed. Pt did not report a huge improvement with performing L lateral hip shifts at home. Rest of session worked on Event organiser of his TA without hip compensation. Pt continues to have difficulty maintaining PPT with alternating UE and LE movement. Discussed potential use of weight lifting belt as an added core stabilizer when he is performing his leisure activities to decrease his hip flare ups.    Personal Factors and Comorbidities Time since onset of injury/illness/exacerbation;Profession;Comorbidity 2    Comorbidities subacromial bone spur removal; ulnar nerve surgery Rt side    Examination-Activity Limitations Lift;Stand    Examination-Participation Restrictions Community Activity;Occupation    Stability/Clinical Decision Making Evolving/Moderate complexity    Rehab Potential Excellent    PT Frequency 2x / week    PT Duration 6 weeks    PT Treatment/Interventions ADLs/Self Care Home Management;Biofeedback;Cryotherapy;Electrical Stimulation;Iontophoresis 36m/ml Dexamethasone;Moist Heat;Traction;Ultrasound;Therapeutic activities;Neuromuscular re-education;Therapeutic exercise;Patient/family education;Manual techniques;Passive range of motion;Dry needling;Taping    PT Next Visit Plan How was McKenzie type extensions? Continue to address pelvic instability. manual therapy to cervical, lumbar, and hip as needed. Progress scapular stability and lifting exercises. Discuss pelvic stability with standing. Continue to work on core/pelvic stabilization without hip compensation. Consider quadruped (bird dog, reverse bird dog, etc.) or sitting on physioball    PT Home Exercise Plan Access Code: [email or web address]   Consulted and Agree with Plan of Care Patient           Patient will benefit from skilled therapeutic intervention in order to improve the following deficits and impairments:  Increased muscle spasms,Decreased  strength,Postural dysfunction,Impaired UE functional use,Decreased range of motion,Pain  Visit Diagnosis: Cervicalgia  Chronic right shoulder pain  Chronic left-sided low back pain, unspecified whether sciatica present     Problem List There  are no problems to display for this patient.   [facility] [address] PT, DPT 12/31/2020, 5:10 PM  [facility] [address] [place], [place], [address] Phone: [phone]   Fax:  [phone]  Name: Keith Montoya MRN: [id number] Date of Birth: 1976-03-27

## 2021-01-05 ENCOUNTER — Ambulatory Visit (HOSPITAL_BASED_OUTPATIENT_CLINIC_OR_DEPARTMENT_OTHER): Payer: PRIVATE HEALTH INSURANCE | Attending: Physical Medicine and Rehabilitation | Admitting: Physical Therapy

## 2021-01-05 ENCOUNTER — Other Ambulatory Visit: Payer: Self-pay

## 2021-01-05 DIAGNOSIS — M25511 Pain in right shoulder: Secondary | ICD-10-CM | POA: Diagnosis present

## 2021-01-05 DIAGNOSIS — G8929 Other chronic pain: Secondary | ICD-10-CM | POA: Insufficient documentation

## 2021-01-05 DIAGNOSIS — M545 Low back pain, unspecified: Secondary | ICD-10-CM | POA: Insufficient documentation

## 2021-01-05 DIAGNOSIS — M542 Cervicalgia: Secondary | ICD-10-CM | POA: Insufficient documentation

## 2021-01-05 NOTE — Therapy (Signed)
[facility] [address] [place], [place], [address] Phone: [phone]   Fax:  [phone]  Physical Therapy Treatment  Patient Details  Name: Keith Montoya MRN: [id number] Date of Birth: 08/03/1976 Referring Provider (PT): Romero Belling, MD   Encounter Date: 01/05/2021   PT End of Session - 01/05/21 1704    Visit Number 5    Number of Visits 13    Date for PT Re-Evaluation 02/07/21    Authorization Type UHC    PT Start Time 1600    PT Stop Time 1645    PT Time Calculation (min) 45 min    Activity Tolerance Patient tolerated treatment well    Behavior During Therapy [facility] for tasks assessed/performed           No past medical history on file.  No past surgical history on file.  There were no vitals filed for this visit.   Subjective Assessment - 01/05/21 1606    Subjective Pt states he did a lot of walking this weekend but had to stop every few minutes due to increased pain in his L quad. Pt states the back of his R shoulder feels very tight today. Reports he is feeling the pain migrating more in the front of thigh. He has stretched the side of his L leg and that pain/pressure from last visit has let up.    Limitations Standing;Other (comment)    Currently in Pain? Yes    Pain Onset More than a month ago                             [facility] Adult PT Treatment/Exercise - 01/05/21 0001      Lumbar Exercises: Stretches   Hip Flexor Stretch Left;20 seconds    Other Lumbar Stretch Exercise R lat stretch x30 sec    Other Lumbar Stretch Exercise L quad stretch x30 sec      Lumbar Exercises: Quadruped   Other Quadruped Lumbar Exercises Reverse bird dog x10; reverse bird dog with lat pull back using orange tband x5    Other Quadruped Lumbar Exercises Bent knee hip extension with TA contraction x10      Manual Therapy   Soft tissue mobilization STM and TPR L quad and R lats                   PT Education - 01/05/21 1658    Education Details Discussed posterior oblique sling in regards to his pain and dysfunctional SI joint. Discussed heat and stretches for his muscles as well as massage gun to his trigger points along his lats.    Person(s) Educated Patient    Methods Explanation;Demonstration;Handout;Verbal cues    Comprehension Verbalized understanding;Returned demonstration;Need further instruction            PT Short Term Goals - 12/13/20 2037      PT SHORT TERM GOAL #1   Title able to demonstrate and report he can maintain good posture at his desk when at work at least 35% of the time    Time 4    Period Weeks    Status New    Target Date 01/10/21      PT SHORT TERM GOAL #2   Title Pt will report 25% less pain in neck and shoulder and in back when standing for long periods of time    Time 4    Period Weeks    Status  New    Target Date 01/10/21             PT Long Term Goals - 12/13/20 2012      PT LONG TERM GOAL #1   Title pt will be able to return to playing tennis due to imrpoved scpular and shoulder stability    Time 8    Period Weeks    Status New    Target Date 02/07/21      PT LONG TERM GOAL #2   Title pt will report 70% less pain when standing so he can attend events for his kids    Time 8    Period Weeks    Status New    Target Date 02/07/21      PT LONG TERM GOAL #3   Title Pt will be ind with advanced HEP for pain management and strength routine to maintain his activity level    Time 8    Period Weeks    Status New    Target Date 02/07/21      PT LONG TERM GOAL #4   Title Pt will report he can work normal work day without feeling any neck or shoulder discomfort    Time 12    Period Weeks    Status New    Target Date 02/07/21                 Plan - 01/05/21 1658    Clinical Impression Statement Treatment focused on stretching and providing manual therapy for pt's trigger points (this session it was L quad and R lats).  Rest of session focused on neuromuscular control of core and strengthening of his posterior oblique sling (i.e. glutes and lats).    Personal Factors and Comorbidities Time since onset of injury/illness/exacerbation;Profession;Comorbidity 2    Comorbidities subacromial bone spur removal; ulnar nerve surgery Rt side    Examination-Activity Limitations Lift;Stand    Examination-Participation Restrictions Community Activity;Occupation    Stability/Clinical Decision Making Evolving/Moderate complexity    Rehab Potential Excellent    PT Frequency 2x / week    PT Duration 6 weeks    PT Treatment/Interventions ADLs/Self Care Home Management;Biofeedback;Cryotherapy;Electrical Stimulation;Iontophoresis 4mg /ml Dexamethasone;Moist Heat;Traction;Ultrasound;Therapeutic activities;Neuromuscular re-education;Therapeutic exercise;Patient/family education;Manual techniques;Passive range of motion;Dry needling;Taping    PT Next Visit Plan Continue to address pelvic instability/posterior oblique sling. Manual therapy as needed. Continue to work on core/pelvic stabilization without hip compensation. Continue quadruped with reverse bird dog. Try wall squat with isometric lat push back against wall. Consider use of shuttle.    PT Home Exercise Plan Access Code: [email or web address]    Consulted and Agree with Plan of Care Patient           Patient will benefit from skilled therapeutic intervention in order to improve the following deficits and impairments:  Increased muscle spasms,Decreased strength,Postural dysfunction,Impaired UE functional use,Decreased range of motion,Pain  Visit Diagnosis: Cervicalgia  Chronic right shoulder pain  Chronic left-sided low back pain, unspecified whether sciatica present     Problem List There are no problems to display for this patient.   [facility] [address] [place] PT, DPT 01/05/2021, 5:09 PM  [facility] [address] [place], [place], [place] Phone: [phone]   Fax:  [phone]  Name: Arnoldo Hildreth MRN: [name] Date of Birth: 12/30/1975

## 2021-01-07 ENCOUNTER — Ambulatory Visit (HOSPITAL_BASED_OUTPATIENT_CLINIC_OR_DEPARTMENT_OTHER): Payer: PRIVATE HEALTH INSURANCE | Admitting: Physical Therapy

## 2021-01-07 DIAGNOSIS — M542 Cervicalgia: Secondary | ICD-10-CM

## 2021-01-07 DIAGNOSIS — G8929 Other chronic pain: Secondary | ICD-10-CM

## 2021-01-07 DIAGNOSIS — M545 Low back pain, unspecified: Secondary | ICD-10-CM

## 2021-01-07 NOTE — Therapy (Signed)
[facility] [address] [place], [place], [address] Phone: [phone]   Fax:  [phone]  Physical Therapy Treatment  Patient Details  Name: Keith Montoya MRN: [id number] Date of Birth: 13-Nov-1976 Referring Provider (PT): Romero Belling, MD   Encounter Date: 01/07/2021   PT End of Session - 01/07/21 1656    Visit Number 6    Number of Visits 13    Date for PT Re-Evaluation 02/07/21    Authorization Type UHC    PT Start Time 1600    PT Stop Time 1645    PT Time Calculation (min) 45 min    Activity Tolerance Patient tolerated treatment well    Behavior During Therapy [facility] for tasks assessed/performed           No past medical history on file.  No past surgical history on file.  There were no vitals filed for this visit.   Subjective Assessment - 01/07/21 1559    Subjective Pt reports quad is feeling better and R shoulder is feeling looser. L IT band is giving him issues today. Pt has been doing his IT stretches.    Limitations Standing;Other (comment)    Currently in Pain? Yes    Pain Score 7     Pain Location Hip    Pain Orientation Left    Pain Onset More than a month ago                             [facility] Adult PT Treatment/Exercise - 01/07/21 0001      Lumbar Exercises: Aerobic   Other Aerobic Exercise Sci-fit L 2 x 5 min      Lumbar Exercises: Machines for Strengthening   Other Lumbar Machine Exercise Shuttle 100# DL press with UE pull down x10; L SL press with R UE pull down 75# x10      Lumbar Exercises: Standing   Other Standing Lumbar Exercises Wall slides with PPT arms against wall x10; wall slide with PPT arms above head x10      Lumbar Exercises: Supine   Other Supine Lumbar Exercises Eccentric L hip bridge + R lat iso x10      Lumbar Exercises: Quadruped   Other Quadruped Lumbar Exercises Bent knee hip extension with TA contraction x10      Manual Therapy   Soft tissue  mobilization STM and TPR R lats, L glute and TFL    Muscle Energy Technique Contract/relax for psoas/TFL stretching and R lats                    PT Short Term Goals - 12/13/20 2037      PT SHORT TERM GOAL #1   Title able to demonstrate and report he can maintain good posture at his desk when at work at least 35% of the time    Time 4    Period Weeks    Status New    Target Date 01/10/21      PT SHORT TERM GOAL #2   Title Pt will report 25% less pain in neck and shoulder and in back when standing for long periods of time    Time 4    Period Weeks    Status New    Target Date 01/10/21             PT Long Term Goals - 12/13/20 2012      PT LONG TERM  GOAL #1   Title pt will be able to return to playing tennis due to imrpoved scpular and shoulder stability    Time 8    Period Weeks    Status New    Target Date 02/07/21      PT LONG TERM GOAL #2   Title pt will report 70% less pain when standing so he can attend events for his kids    Time 8    Period Weeks    Status New    Target Date 02/07/21      PT LONG TERM GOAL #3   Title Pt will be ind with advanced HEP for pain management and strength routine to maintain his activity level    Time 8    Period Weeks    Status New    Target Date 02/07/21      PT LONG TERM GOAL #4   Title Pt will report he can work normal work day without feeling any neck or shoulder discomfort    Time 12    Period Weeks    Status New    Target Date 02/07/21                 Plan - 01/07/21 1652    Clinical Impression Statement Pt has continued R shoulder tightness and L hip/IT band tightness. Addressed with manual therapy/STW and trigger point release. Continued stretching as needed for pt's L hip flexors and lats. Continued to focus on neuromuscular control coordinating activation of core, glutes, and lats for posterior oblique sling. Pt with immediate L hip pain without use of core. Progressed pt's bridging exercise for  eccentric hamstring/glute activation and initiated standing exercises.    Personal Factors and Comorbidities Time since onset of injury/illness/exacerbation;Profession;Comorbidity 2    Comorbidities subacromial bone spur removal; ulnar nerve surgery Rt side    Examination-Activity Limitations Lift;Stand    Examination-Participation Restrictions Community Activity;Occupation    Stability/Clinical Decision Making Evolving/Moderate complexity    Rehab Potential Excellent    PT Frequency 2x / week    PT Duration 6 weeks    PT Treatment/Interventions ADLs/Self Care Home Management;Biofeedback;Cryotherapy;Electrical Stimulation;Iontophoresis 4mg /ml Dexamethasone;Moist Heat;Traction;Ultrasound;Therapeutic activities;Neuromuscular re-education;Therapeutic exercise;Patient/family education;Manual techniques;Passive range of motion;Dry needling;Taping    PT Next Visit Plan Continue to address pelvic instability/posterior oblique sling. Manual therapy as needed. Continue to work on core/pelvic stabilization without hip compensation. Continue quadruped with reverse bird dog. Try wall squat with isometric lat push back against wall. Consider use of shuttle.    PT Home Exercise Plan Access Code: [email or web address]    Consulted and Agree with Plan of Care Patient           Patient will benefit from skilled therapeutic intervention in order to improve the following deficits and impairments:  Increased muscle spasms,Decreased strength,Postural dysfunction,Impaired UE functional use,Decreased range of motion,Pain  Visit Diagnosis: Cervicalgia  Chronic right shoulder pain  Chronic left-sided low back pain, unspecified whether sciatica present     Problem List There are no problems to display for this patient.   [facility] [address] PT, DPT 01/07/2021, 5:02 PM  [facility] [address] [place], [place], [place] Phone: [phone]   Fax:   [phone]  Name: Agostino Gorin MRN: [name] Date of Birth: June 08, 1976

## 2021-01-10 ENCOUNTER — Ambulatory Visit (HOSPITAL_BASED_OUTPATIENT_CLINIC_OR_DEPARTMENT_OTHER): Payer: PRIVATE HEALTH INSURANCE | Admitting: Physical Therapy

## 2021-01-10 ENCOUNTER — Other Ambulatory Visit: Payer: Self-pay

## 2021-01-10 DIAGNOSIS — G8929 Other chronic pain: Secondary | ICD-10-CM

## 2021-01-10 DIAGNOSIS — M545 Low back pain, unspecified: Secondary | ICD-10-CM

## 2021-01-10 DIAGNOSIS — M542 Cervicalgia: Secondary | ICD-10-CM

## 2021-01-10 NOTE — Therapy (Signed)
[facility] [address] [place], [place], [address] Phone: [phone]   Fax:  [phone]  Physical Therapy Treatment  Patient Details  Name: Keith Montoya MRN: [id number] Date of Birth: [birth year] Referring Provider (PT): Romero Belling, MD   Encounter Date: 01/10/2021   PT End of Session - 01/10/21 1602    Visit Number 7    Number of Visits 13    Date for PT Re-Evaluation 02/07/21    Authorization Type UHC    PT Start Time 1602    PT Stop Time 1645    PT Time Calculation (min) 43 min    Activity Tolerance Patient tolerated treatment well    Behavior During Therapy [facility] for tasks assessed/performed           No past medical history on file.  No past surgical history on file.  There were no vitals filed for this visit.   Subjective Assessment - 01/10/21 1604    Subjective Pt states hip has been feeling better. He has been stretching in the morning. Pt with plans to play pickleball this Saturday. Pt reports some R shoulder tightness. Pt states he spent most of the weekend working.    Limitations Standing;Other (comment)    Currently in Pain? Yes    Pain Score 5     Pain Location Hip    Pain Orientation Left    Pain Descriptors / Indicators Tightness;Sore    Pain Type Chronic pain    Pain Onset More than a month ago                             [facility] Adult PT Treatment/Exercise - 01/10/21 0001      Exercises   Exercises Shoulder      Lumbar Exercises: Aerobic   Other Aerobic Exercise Sci-fit L 2 x 6 min      Lumbar Exercises: Machines for Strengthening   Other Lumbar Machine Exercise Shuttle 50# quadruped with L donkey kick R arm extension x10; L & R donkey kick x10; 100# double leg press with bilat UE extension x10; single leg press with L UE pull down x10      Lumbar Exercises: Standing   Other Standing Lumbar Exercises Wall slides with PPT arms against wall 2x10; wall slide with PPT  arms above head 2x10      Shoulder Exercises: Standing   Other Standing Exercises Lower trap wall set 2x10      Manual Therapy   Joint Mobilization Grade III inferior and anterior GH joint mob,    Soft tissue mobilization STM and TPR R lats                    PT Short Term Goals - 01/10/21 1656      PT SHORT TERM GOAL #1   Title able to demonstrate and report he can maintain good posture at his desk when at work at least 35% of the time    Time 4    Period Weeks    Status On-going    Target Date 01/10/21      PT SHORT TERM GOAL #2   Title Pt will report 25% less pain in neck and shoulder and in back when standing for long periods of time    Time 4    Period Weeks    Status On-going    Target Date 01/10/21  PT Long Term Goals - 12/13/20 2012      PT LONG TERM GOAL #1   Title pt will be able to return to playing tennis due to imrpoved scpular and shoulder stability    Time 8    Period Weeks    Status New    Target Date 02/07/21      PT LONG TERM GOAL #2   Title pt will report 70% less pain when standing so he can attend events for his kids    Time 8    Period Weeks    Status New    Target Date 02/07/21      PT LONG TERM GOAL #3   Title Pt will be ind with advanced HEP for pain management and strength routine to maintain his activity level    Time 8    Period Weeks    Status New    Target Date 02/07/21      PT LONG TERM GOAL #4   Title Pt will report he can work normal work day without feeling any neck or shoulder discomfort    Time 12    Period Weeks    Status New    Target Date 02/07/21                 Plan - 01/10/21 1638    Clinical Impression Statement Treatment session continues to focus on improving pt's neuromuscular control and strengthening of posterior oblique sling. Pt with improved ability to maintain core stabilization. Initiated standing shoulder exercises as pt has been better able to tolerate standing without  exacerbating his hip. Manual therapy applied to continue to address pt's R shoulder tightness. Will attempt to continue scapular stabilization exercises to improve GH stability.    Personal Factors and Comorbidities Time since onset of injury/illness/exacerbation;Profession;Comorbidity 2    Comorbidities subacromial bone spur removal; ulnar nerve surgery Rt side    Examination-Activity Limitations Lift;Stand    Examination-Participation Restrictions Community Activity;Occupation    Stability/Clinical Decision Making Evolving/Moderate complexity    Rehab Potential Excellent    PT Frequency 2x / week    PT Duration 6 weeks    PT Treatment/Interventions ADLs/Self Care Home Management;Biofeedback;Cryotherapy;Electrical Stimulation;Iontophoresis 4mg /ml Dexamethasone;Moist Heat;Traction;Ultrasound;Therapeutic activities;Neuromuscular re-education;Therapeutic exercise;Patient/family education;Manual techniques;Passive range of motion;Dry needling;Taping    PT Next Visit Plan Continue to address pelvic instability/posterior oblique sling. Manual therapy as needed. Continue to work and progress core/pelvic stabilization without hip compensation in standing and all positions. Attempt single leg standing exercises. Begin light plyometrics. Taping next session as needed    PT Home Exercise Plan Access Code: [email or web address]    Consulted and Agree with Plan of Care Patient           Patient will benefit from skilled therapeutic intervention in order to improve the following deficits and impairments:  Increased muscle spasms,Decreased strength,Postural dysfunction,Impaired UE functional use,Decreased range of motion,Pain  Visit Diagnosis: Cervicalgia  Chronic right shoulder pain  Chronic left-sided low back pain, unspecified whether sciatica present     Problem List There are no problems to display for this patient.   [facility] [address] PT, DPT 01/10/2021, 4:58 PM  [facility] [address] [place], [place], [place] Phone: [phone]   Fax:  [phone]  Name: Keith Montoya MRN: [name] Date of Birth: [birth year]

## 2021-01-12 ENCOUNTER — Other Ambulatory Visit: Payer: Self-pay

## 2021-01-12 ENCOUNTER — Ambulatory Visit (HOSPITAL_BASED_OUTPATIENT_CLINIC_OR_DEPARTMENT_OTHER): Payer: PRIVATE HEALTH INSURANCE | Attending: Physical Medicine and Rehabilitation | Admitting: Physical Therapy

## 2021-01-12 DIAGNOSIS — M25511 Pain in right shoulder: Secondary | ICD-10-CM | POA: Diagnosis present

## 2021-01-12 DIAGNOSIS — G8929 Other chronic pain: Secondary | ICD-10-CM

## 2021-01-12 DIAGNOSIS — M542 Cervicalgia: Secondary | ICD-10-CM | POA: Diagnosis present

## 2021-01-12 DIAGNOSIS — M545 Low back pain, unspecified: Secondary | ICD-10-CM | POA: Diagnosis present

## 2021-01-12 NOTE — Therapy (Signed)
[facility] [address] [place], [place], [address] Phone: [phone]   Fax:  [phone]  Physical Therapy Treatment  Patient Details  Name: Keith Montoya MRN: [id number] Date of Birth: 06-Dec-1975 Referring Provider (PT): Romero Belling, MD   Encounter Date: 01/12/2021   PT End of Session - 01/12/21 1718    Visit Number 8    Number of Visits 13    Date for PT Re-Evaluation 02/07/21    Authorization Type UHC    PT Start Time 1600    PT Stop Time 1650    PT Time Calculation (min) 50 min    Activity Tolerance Patient tolerated treatment well    Behavior During Therapy [facility] for tasks assessed/performed           No past medical history on file.  No past surgical history on file.  There were no vitals filed for this visit.   Subjective Assessment - 01/12/21 1558    Subjective Pt reports general lower body soreness with some mild continued tightness on that spot. Pt states shoulder did not tighten back up.    Limitations Standing;Other (comment)    Currently in Pain? Yes    Pain Score 5     Pain Orientation Left    Pain Descriptors / Indicators Tightness    Pain Onset More than a month ago                             [facility] Adult PT Treatment/Exercise - 01/12/21 0001      Lumbar Exercises: Aerobic   Other Aerobic Exercise Sci-fit L 2 x 6 min      Lumbar Exercises: Machines for Strengthening   Other Lumbar Machine Exercise Shuttle 89# DL 2X51; 70# UE only 0F74      Lumbar Exercises: Standing   Other Standing Lumbar Exercises Half kneeling with lat pull back x5   Attempted; however, pt unable to maintain pelvic stability (L pelvic obliquity)   Other Standing Lumbar Exercises Captain morgan against wall 2x20 sec on L; attempted single leg wall slide x2; attempted eccentric wall slide x5; forward step ups x10 bilat, step downs x10 bilat      Manual Therapy   Manual Therapy Taping    Soft tissue  mobilization STM and TPR R lats    Kinesiotex Create Space;Inhibit Muscle;Facilitate Muscle      Kinesiotix   Create Space IT Band    Inhibit Muscle  L TFL    Facilitate Muscle  L glute med and R lats                    PT Short Term Goals - 01/10/21 1656      PT SHORT TERM GOAL #1   Title able to demonstrate and report he can maintain good posture at his desk when at work at least 35% of the time    Time 4    Period Weeks    Status On-going    Target Date 01/10/21      PT SHORT TERM GOAL #2   Title Pt will report 25% less pain in neck and shoulder and in back when standing for long periods of time    Time 4    Period Weeks    Status On-going    Target Date 01/10/21             PT Long Term Goals - 12/13/20 2012  PT LONG TERM GOAL #1   Title pt will be able to return to playing tennis due to imrpoved scpular and shoulder stability    Time 8    Period Weeks    Status New    Target Date 02/07/21      PT LONG TERM GOAL #2   Title pt will report 70% less pain when standing so he can attend events for his kids    Time 8    Period Weeks    Status New    Target Date 02/07/21      PT LONG TERM GOAL #3   Title Pt will be ind with advanced HEP for pain management and strength routine to maintain his activity level    Time 8    Period Weeks    Status New    Target Date 02/07/21      PT LONG TERM GOAL #4   Title Pt will report he can work normal work day without feeling any neck or shoulder discomfort    Time 12    Period Weeks    Status New    Target Date 02/07/21                 Plan - 01/12/21 1716    Clinical Impression Statement Attempting to progress pt's standing exercises into single leg/half kneeling. Pt with difficulty maintaining pelvic stability. Without wall stabilizing, pt has difficulty maintaining core contraction and becomes symptomatic along his IT band/hip. Manual therapy and taping applied to pt's R lat and L glute  med/TFL/ITB.    Personal Factors and Comorbidities Time since onset of injury/illness/exacerbation;Profession;Comorbidity 2    Comorbidities subacromial bone spur removal; ulnar nerve surgery Rt side    Examination-Activity Limitations Lift;Stand    Examination-Participation Restrictions Community Activity;Occupation    Stability/Clinical Decision Making Evolving/Moderate complexity    Rehab Potential Excellent    PT Frequency 2x / week    PT Duration 6 weeks    PT Treatment/Interventions ADLs/Self Care Home Management;Biofeedback;Cryotherapy;Electrical Stimulation;Iontophoresis 4mg /ml Dexamethasone;Moist Heat;Traction;Ultrasound;Therapeutic activities;Neuromuscular re-education;Therapeutic exercise;Patient/family education;Manual techniques;Passive range of motion;Dry needling;Taping    PT Next Visit Plan Continue to address pelvic instability/posterior oblique sling. Manual therapy as needed. Continue to work and progress core/pelvic stabilization without hip compensation in standing and all positions. Attempt single leg standing exercises; if unable try supine single leg exercises. Begin light plyometrics.    PT Home Exercise Plan Access Code: [email or web address]    Consulted and Agree with Plan of Care Patient           Patient will benefit from skilled therapeutic intervention in order to improve the following deficits and impairments:  Increased muscle spasms,Decreased strength,Postural dysfunction,Impaired UE functional use,Decreased range of motion,Pain  Visit Diagnosis: Cervicalgia  Chronic right shoulder pain  Chronic left-sided low back pain, unspecified whether sciatica present     Problem List There are no problems to display for this patient.   [facility] [address] PT, DPT 01/12/2021, 5:21 PM  [facility] [address] [place], [place], [place] Phone: [phone]   Fax:  [phone]  Name: Keith Montoya MRN:  Keith Montoya Date of Birth: Oct 01, 1976

## 2021-01-17 ENCOUNTER — Ambulatory Visit (HOSPITAL_BASED_OUTPATIENT_CLINIC_OR_DEPARTMENT_OTHER): Payer: PRIVATE HEALTH INSURANCE | Admitting: Physical Therapy

## 2021-01-17 ENCOUNTER — Other Ambulatory Visit: Payer: Self-pay

## 2021-01-17 DIAGNOSIS — G8929 Other chronic pain: Secondary | ICD-10-CM

## 2021-01-17 DIAGNOSIS — M542 Cervicalgia: Secondary | ICD-10-CM | POA: Diagnosis not present

## 2021-01-17 DIAGNOSIS — M545 Low back pain, unspecified: Secondary | ICD-10-CM

## 2021-01-17 NOTE — Therapy (Signed)
[facility] [address] [place], [place], [address] Phone: [phone]   Fax:  [phone]  Physical Therapy Treatment  Patient Details  Name: Keith Montoya MRN: [id number] Date of Birth: 1976/05/02 Referring Provider (PT): Romero Belling, MD   Encounter Date: 01/17/2021   PT End of Session - 01/17/21 1704    Visit Number 9    Number of Visits 13    Date for PT Re-Evaluation 02/07/21    Authorization Type UHC    PT Start Time 1600    PT Stop Time 1645    PT Time Calculation (min) 45 min    Activity Tolerance Patient tolerated treatment well    Behavior During Therapy [facility] for tasks assessed/performed           No past medical history on file.  No past surgical history on file.  There were no vitals filed for this visit.   Subjective Assessment - 01/17/21 1553    Subjective Pt states he was able to play pretty good on Wednesday night (~2 hrs) after last PT session. He states Saturday was not good and he could not get loose and did a lot more standing. He is feeling really tight in his right shoulder. Pt states his hamstrings feel sore. Pt states while he was playing pickleball he could feel it down his IT band.    Limitations Standing;Other (comment)    Currently in Pain? Yes    Pain Score 5     Pain Orientation Left    Pain Descriptors / Indicators Tightness    Pain Type Chronic pain    Pain Onset More than a month ago                             [facility] Adult PT Treatment/Exercise - 01/17/21 0001      Lumbar Exercises: Aerobic   Other Aerobic Exercise Sci-fit L 1 x 5 min      Lumbar Exercises: Standing   Other Standing Lumbar Exercises Anterior oblique diagonal chops 2x10    Other Standing Lumbar Exercises 4" step up with lat pull 2x10      Lumbar Exercises: Supine   Bridge Compliant;10 reps   Eccentric L LE single leg with R UE push down   Other Supine Lumbar Exercises Deadbug with iso  hold 2x10    Other Supine Lumbar Exercises --      Lumbar Exercises: Prone   Other Prone Lumbar Exercises Bent leg raise 2x10 bilat; bent leg raise with alternating arm raise x10                    PT Short Term Goals - 01/10/21 1656      PT SHORT TERM GOAL #1   Title able to demonstrate and report he can maintain good posture at his desk when at work at least 35% of the time    Time 4    Period Weeks    Status On-going    Target Date 01/10/21      PT SHORT TERM GOAL #2   Title Pt will report 25% less pain in neck and shoulder and in back when standing for long periods of time    Time 4    Period Weeks    Status On-going    Target Date 01/10/21             PT Long Term Goals - 12/13/20 2012  PT LONG TERM GOAL #1   Title pt will be able to return to playing tennis due to imrpoved scpular and shoulder stability    Time 8    Period Weeks    Status New    Target Date 02/07/21      PT LONG TERM GOAL #2   Title pt will report 70% less pain when standing so he can attend events for his kids    Time 8    Period Weeks    Status New    Target Date 02/07/21      PT LONG TERM GOAL #3   Title Pt will be ind with advanced HEP for pain management and strength routine to maintain his activity level    Time 8    Period Weeks    Status New    Target Date 02/07/21      PT LONG TERM GOAL #4   Title Pt will report he can work normal work day without feeling any neck or shoulder discomfort    Time 12    Period Weeks    Status New    Target Date 02/07/21                 Plan - 01/17/21 1700    Clinical Impression Statement Continued to trial pt to progress him into standing; however, pt continues to become symptomatic when L hip is flexed. Pt with difficulty feeling glute activation (tends to feel it in his quad and IT band); able to feel it in prone. Performed exercises in prone and supine. Pt's anterior oblique sling appears weak as well during testing  (pushing L hip anteriorly causes increased symptoms). Added anterior oblique strengthening.    Personal Factors and Comorbidities Time since onset of injury/illness/exacerbation;Profession;Comorbidity 2    Comorbidities subacromial bone spur removal; ulnar nerve surgery Rt side    Examination-Activity Limitations Lift;Stand    Examination-Participation Restrictions Community Activity;Occupation    Stability/Clinical Decision Making Evolving/Moderate complexity    Rehab Potential Excellent    PT Frequency 2x / week    PT Duration 6 weeks    PT Treatment/Interventions ADLs/Self Care Home Management;Biofeedback;Cryotherapy;Electrical Stimulation;Iontophoresis 4mg /ml Dexamethasone;Moist Heat;Traction;Ultrasound;Therapeutic activities;Neuromuscular re-education;Therapeutic exercise;Patient/family education;Manual techniques;Passive range of motion;Dry needling;Taping    PT Next Visit Plan Continue to address pelvic instability/posterior oblique sling. Manual therapy as needed. Continue to work and progress core/pelvic stabilization without hip compensation in standing and all positions. Attempt single leg standing exercises; if unable try supine single leg exercises. .    PT Home Exercise Plan Access Code: [email or web address]    Consulted and Agree with Plan of Care Patient           Patient will benefit from skilled therapeutic intervention in order to improve the following deficits and impairments:  Increased muscle spasms,Decreased strength,Postural dysfunction,Impaired UE functional use,Decreased range of motion,Pain  Visit Diagnosis: Cervicalgia  Chronic right shoulder pain  Chronic left-sided low back pain, unspecified whether sciatica present     Problem List There are no problems to display for this patient.   [facility] [address]  PT, DPT 01/17/2021, 5:09 PM  [facility] [address] [place], [place], [place] Phone: [phone]    Fax:  [phone]  Name: Keith Montoya MRN: [name] Date of Birth: 1976-09-22

## 2021-01-19 ENCOUNTER — Ambulatory Visit (HOSPITAL_BASED_OUTPATIENT_CLINIC_OR_DEPARTMENT_OTHER): Payer: PRIVATE HEALTH INSURANCE | Admitting: Physical Therapy

## 2021-01-19 ENCOUNTER — Other Ambulatory Visit: Payer: Self-pay

## 2021-01-19 DIAGNOSIS — M545 Low back pain, unspecified: Secondary | ICD-10-CM

## 2021-01-19 DIAGNOSIS — G8929 Other chronic pain: Secondary | ICD-10-CM

## 2021-01-19 DIAGNOSIS — M542 Cervicalgia: Secondary | ICD-10-CM | POA: Diagnosis not present

## 2021-01-19 NOTE — Therapy (Signed)
[facility] [address] [place], [place], [address] Phone: [phone]   Fax:  [phone]  Physical Therapy Treatment  Patient Details  Name: Keith Montoya MRN: [id number] Date of Birth: 09/28/1976 Referring Provider (PT): Romero Belling, MD   Encounter Date: 01/19/2021   PT End of Session - 01/19/21 1704    Visit Number 10    Number of Visits 13    Date for PT Re-Evaluation 02/07/21    Authorization Type UHC    PT Start Time 1555    PT Stop Time 1640    PT Time Calculation (min) 45 min    Activity Tolerance Patient tolerated treatment well    Behavior During Therapy [facility] for tasks assessed/performed           No past medical history on file.  No past surgical history on file.  There were no vitals filed for this visit.   Subjective Assessment - 01/19/21 1554    Subjective Pt states he was able to foam roll his lats. Pt was able to use theragun on his lat area. Pt has been able to do the exercise.    Limitations Standing;Other (comment)    Pain Onset More than a month ago                             [facility] Adult PT Treatment/Exercise - 01/19/21 0001      Lumbar Exercises: Aerobic   Other Aerobic Exercise Sci-fit L 2.5 x 4 min      Lumbar Exercises: Seated   Sit to Stand 20 reps    Sit to Stand Limitations cues for pelvic placement and push through heels with arms extended    Other Seated Lumbar Exercises Attempt single leg eccentric sit<>stand x5, attempted staggered stance sit<>stand x5   Limited due to pt feeling symptomatic   Other Seated Lumbar Exercises Attempted bosu ball mini squats x 5   Increased symptoms     Lumbar Exercises: Supine   Other Supine Lumbar Exercises Knees 90/90 with shoulder flexion with 1lb dowel 3x10; deadbug with pball 2x10; swissball bridges 2x10      Lumbar Exercises: Prone   Other Prone Lumbar Exercises Bent leg raise 2x10 bilat; bent leg raise with  alternating arm raise x10      Lumbar Exercises: Quadruped   Other Quadruped Lumbar Exercises Reverse bird dog x10; hip extension with 2lb row x10                  PT Education - 01/19/21 1702    Education Details Discussed finding opportunities within his day to work on pelvic/core stability in standing and glute activation.    Person(s) Educated Patient    Methods Explanation;Demonstration;Handout;Verbal cues    Comprehension Verbalized understanding;Returned demonstration;Need further instruction            PT Short Term Goals - 01/10/21 1656      PT SHORT TERM GOAL #1   Title able to demonstrate and report he can maintain good posture at his desk when at work at least 35% of the time    Time 4    Period Weeks    Status On-going    Target Date 01/10/21      PT SHORT TERM GOAL #2   Title Pt will report 25% less pain in neck and shoulder and in back when standing for long periods of time    Time 4  Period Weeks    Status On-going    Target Date 01/10/21             PT Long Term Goals - 12/13/20 2012      PT LONG TERM GOAL #1   Title pt will be able to return to playing tennis due to imrpoved scpular and shoulder stability    Time 8    Period Weeks    Status New    Target Date 02/07/21      PT LONG TERM GOAL #2   Title pt will report 70% less pain when standing so he can attend events for his kids    Time 8    Period Weeks    Status New    Target Date 02/07/21      PT LONG TERM GOAL #3   Title Pt will be ind with advanced HEP for pain management and strength routine to maintain his activity level    Time 8    Period Weeks    Status New    Target Date 02/07/21      PT LONG TERM GOAL #4   Title Pt will report he can work normal work day without feeling any neck or shoulder discomfort    Time 12    Period Weeks    Status New    Target Date 02/07/21                 Plan - 01/19/21 1700    Clinical Impression Statement Treatment  focused on continuing to improve core stabilization in supine. Progressed pt to sit<>stand with core stabilization to initiate pelvic stability without cues from the wall. R lat continues to be overactive with arms overhead activity. Pt is stretching and managing lat and ITband overactivity at home.    Personal Factors and Comorbidities Time since onset of injury/illness/exacerbation;Profession;Comorbidity 2    Comorbidities subacromial bone spur removal; ulnar nerve surgery Rt side    Examination-Activity Limitations Lift;Stand    Examination-Participation Restrictions Community Activity;Occupation    Stability/Clinical Decision Making Evolving/Moderate complexity    Rehab Potential Excellent    PT Frequency 2x / week    PT Duration 6 weeks    PT Treatment/Interventions ADLs/Self Care Home Management;Biofeedback;Cryotherapy;Electrical Stimulation;Iontophoresis 4mg /ml Dexamethasone;Moist Heat;Traction;Ultrasound;Therapeutic activities;Neuromuscular re-education;Therapeutic exercise;Patient/family education;Manual techniques;Passive range of motion;Dry needling;Taping    PT Next Visit Plan Continue to address pelvic instability/posterior oblique sling. Manual therapy as needed. Continue to work and progress core/pelvic stabilization without hip compensation in standing and all positions. Progress sit<>stand and progress pt to single leg stance as able.    PT Home Exercise Plan Access Code: [email or web address]    Consulted and Agree with Plan of Care Patient           Patient will benefit from skilled therapeutic intervention in order to improve the following deficits and impairments:  Increased muscle spasms,Decreased strength,Postural dysfunction,Impaired UE functional use,Decreased range of motion,Pain  Visit Diagnosis: Cervicalgia  Chronic right shoulder pain  Chronic left-sided low back pain, unspecified whether sciatica present     Problem List There are no problems to display for this  patient.   [facility] [address] PT, DPT 01/19/2021, 5:05 PM  [facility] [address] [place], [place], [place] Phone: [phone]   Fax:  [phone]  Name: Benedetto Ryder MRN: [name] Date of Birth: October 26, 1976

## 2021-01-24 ENCOUNTER — Ambulatory Visit (HOSPITAL_BASED_OUTPATIENT_CLINIC_OR_DEPARTMENT_OTHER): Payer: PRIVATE HEALTH INSURANCE | Admitting: Physical Therapy

## 2021-01-24 ENCOUNTER — Other Ambulatory Visit: Payer: Self-pay

## 2021-01-24 DIAGNOSIS — M542 Cervicalgia: Secondary | ICD-10-CM

## 2021-01-24 DIAGNOSIS — M25511 Pain in right shoulder: Secondary | ICD-10-CM

## 2021-01-24 DIAGNOSIS — G8929 Other chronic pain: Secondary | ICD-10-CM

## 2021-01-24 NOTE — Therapy (Signed)
[facility] [address] [place], [place], [address] Phone: [phone]   Fax:  [phone]  Physical Therapy Treatment  Patient Details  Name: Keith Montoya MRN: [id number] Date of Birth: Nov 01, 1976 Referring Provider (PT): Romero Belling, MD   Encounter Date: 01/24/2021   PT End of Session - 01/24/21 1605    Visit Number 11    Number of Visits 13    Date for PT Re-Evaluation 02/07/21    Authorization Type UHC    PT Start Time 1605    PT Stop Time 1645    PT Time Calculation (min) 40 min    Activity Tolerance Patient tolerated treatment well    Behavior During Therapy [facility] for tasks assessed/performed           No past medical history on file.  No past surgical history on file.  There were no vitals filed for this visit.   Subjective Assessment - 01/24/21 1602    Subjective Pt states the exercises went well. He still has some trouble with the low trap exercises. Pt was able to foam roll his lats.    Limitations Standing;Other (comment)    Pain Onset More than a month ago             Manual therapy:  TPR Right lat and teres major  Foam rolling R lat and teres major  Therex:  Kneeling thoracic extension + lat stretch 5x10 sec hold  Kneeling lower trap setting 2x10  Push-up plus 2x10; pt with difficulty maintaining core and shoulder stabilization  Modified push-up on incline with plus 2x10  Seated green theraband punch 2x10  Sit to stand eccentrics with UEs flexed x10  Sit to stand eccentrics on foam UEs flexed x10  Sit to stand one foot on foam UEs flexed x10  Sit to stand one foot on foam in slight tandem x10  6" Runner step up x10; pt becoming slightly symptomatic                PT Short Term Goals - 01/10/21 1656      PT SHORT TERM GOAL #1   Title able to demonstrate and report he can maintain good posture at his desk when at work at least 35% of the time    Time 4    Period Weeks     Status On-going    Target Date 01/10/21      PT SHORT TERM GOAL #2   Title Pt will report 25% less pain in neck and shoulder and in back when standing for long periods of time    Time 4    Period Weeks    Status On-going    Target Date 01/10/21             PT Long Term Goals - 12/13/20 2012      PT LONG TERM GOAL #1   Title pt will be able to return to playing tennis due to imrpoved scpular and shoulder stability    Time 8    Period Weeks    Status New    Target Date 02/07/21      PT LONG TERM GOAL #2   Title pt will report 70% less pain when standing so he can attend events for his kids    Time 8    Period Weeks    Status New    Target Date 02/07/21      PT LONG TERM GOAL #3   Title Pt  will be ind with advanced HEP for pain management and strength routine to maintain his activity level    Time 8    Period Weeks    Status New    Target Date 02/07/21      PT LONG TERM GOAL #4   Title Pt will report he can work normal work day without feeling any neck or shoulder discomfort    Time 12    Period Weeks    Status New    Target Date 02/07/21                  Patient will benefit from skilled therapeutic intervention in order to improve the following deficits and impairments:     Visit Diagnosis: No diagnosis found.     Problem List There are no problems to display for this patient.   [facility] [address] PT, DPT 01/24/2021, 4:22 PM  [facility] [address] [place], [place], [address] Phone: [phone]   Fax:  [phone]  Name: Keith Montoya MRN: [id number] Date of Birth: Jul 08, 1976

## 2021-01-26 ENCOUNTER — Other Ambulatory Visit: Payer: Self-pay

## 2021-01-26 ENCOUNTER — Ambulatory Visit (HOSPITAL_BASED_OUTPATIENT_CLINIC_OR_DEPARTMENT_OTHER): Payer: PRIVATE HEALTH INSURANCE | Admitting: Physical Therapy

## 2021-01-26 DIAGNOSIS — M545 Low back pain, unspecified: Secondary | ICD-10-CM

## 2021-01-26 DIAGNOSIS — M542 Cervicalgia: Secondary | ICD-10-CM

## 2021-01-26 DIAGNOSIS — G8929 Other chronic pain: Secondary | ICD-10-CM

## 2021-01-26 NOTE — Therapy (Signed)
[facility] [address] [place], [place], [address] Phone: [phone]   Fax:  [phone]  Physical Therapy Treatment  Patient Details  Name: Keith Montoya MRN: [id number] Date of Birth: 05-25-1976 Referring Provider (PT): Romero Belling, MD   Encounter Date: 01/26/2021   PT End of Session - 01/26/21 1700    Visit Number 12    Number of Visits 13    Date for PT Re-Evaluation 02/07/21    Authorization Type UHC    PT Start Time 1601    PT Stop Time 1650    PT Time Calculation (min) 49 min    Activity Tolerance Patient tolerated treatment well    Behavior During Therapy [facility] for tasks assessed/performed           No past medical history on file.  No past surgical history on file.  There were no vitals filed for this visit.   Subjective Assessment - 01/26/21 1558    Subjective Pt reports he was able to get through his exercises without "zinging." Pt states he can feel muscle soreness from the exercises and not his regular pain. He notes some tightness in his R posterior shoulder and L lateral thigh.    Limitations Standing;Other (comment)    Currently in Pain? Yes    Pain Score 5     Pain Onset More than a month ago                  Manual therapy:  TPR Right lat and teres major  TPR L ITB  IASTM L ITB  Foam rolling R lat and teres major  Therex:  Kneeling thoracic extension + lat stretch 5x10 sec hold  Kneeling lower trap setting 2x10  Attempted holding squat with thoracic punch green tband x5; however pt feeling too much in quad  Seated with forward lean thoracic punch green tband x10  Standing serratus anterior with foam roll x10; limited due to pt unable to maintain pelvic stability/core -- began to feel "zing"  Seated shoulder ER green tband 2x10  Seated shoulder flexion green tband x10  Seated shoulder abduction green tband 2x10           PT Education - 01/26/21 1706    Education  Details Discussed mouse ergonomics to decrease lat activity.    Person(s) Educated Patient    Methods Explanation;Demonstration;Handout    Comprehension Verbalized understanding;Returned demonstration;Need further instruction            PT Short Term Goals - 01/10/21 1656      PT SHORT TERM GOAL #1   Title able to demonstrate and report he can maintain good posture at his desk when at work at least 35% of the time    Time 4    Period Weeks    Status On-going    Target Date 01/10/21      PT SHORT TERM GOAL #2   Title Pt will report 25% less pain in neck and shoulder and in back when standing for long periods of time    Time 4    Period Weeks    Status On-going    Target Date 01/10/21             PT Long Term Goals - 12/13/20 2012      PT LONG TERM GOAL #1   Title pt will be able to return to playing tennis due to imrpoved scpular and shoulder stability    Time 8  Period Weeks    Status New    Target Date 02/07/21      PT LONG TERM GOAL #2   Title pt will report 70% less pain when standing so he can attend events for his kids    Time 8    Period Weeks    Status New    Target Date 02/07/21      PT LONG TERM GOAL #3   Title Pt will be ind with advanced HEP for pain management and strength routine to maintain his activity level    Time 8    Period Weeks    Status New    Target Date 02/07/21      PT LONG TERM GOAL #4   Title Pt will report he can work normal work day without feeling any neck or shoulder discomfort    Time 12    Period Weeks    Status New    Target Date 02/07/21                 Plan - 01/26/21 1658    Clinical Impression Statement Continued strengthening pt's serratus anterior, RTC, and low trap to try and reduce R lat overactivity. Continued stretching and manual therapy to address pt's tightness. Attempting to increase pt's shoulder/back exercises into standing without his IT band "zinging". Pt with continued difficulty coordinating  core with upper extremity movement in standing. Cues to decrease R lateral trunk lean in sitting.    Personal Factors and Comorbidities Time since onset of injury/illness/exacerbation;Profession;Comorbidity 2    Comorbidities subacromial bone spur removal; ulnar nerve surgery Rt side    Examination-Activity Limitations Lift;Stand    Examination-Participation Restrictions Community Activity;Occupation    Stability/Clinical Decision Making Evolving/Moderate complexity    Rehab Potential Excellent    PT Frequency 2x / week    PT Duration 6 weeks    PT Treatment/Interventions ADLs/Self Care Home Management;Biofeedback;Cryotherapy;Electrical Stimulation;Iontophoresis 4mg /ml Dexamethasone;Moist Heat;Traction;Ultrasound;Therapeutic activities;Neuromuscular re-education;Therapeutic exercise;Patient/family education;Manual techniques;Passive range of motion;Dry needling;Taping    PT Next Visit Plan Continue to address pelvic instability/posterior oblique sling. Manual therapy as needed. Continue to work and progress core/pelvic stabilization without ITB overactivity in standing/staggered stance and single leg stance. Progress sit<>stand and progress pt to single leg stance as able. Continue to work on reducing lat overactivity with stretches, foam rolling, and strengthening (consider lower trap, serratus, RTC, anterior deltoid etc)    PT Home Exercise Plan Access Code: [email or web address]    Consulted and Agree with Plan of Care Patient           Patient will benefit from skilled therapeutic intervention in order to improve the following deficits and impairments:  Increased muscle spasms,Decreased strength,Postural dysfunction,Impaired UE functional use,Decreased range of motion,Pain  Visit Diagnosis: Cervicalgia  Chronic right shoulder pain  Chronic left-sided low back pain, unspecified whether sciatica present     Problem List There are no problems to display for this patient.   [facility] [address] PT, DPT 01/26/2021, 5:07 PM  [facility] [address] [place], [place], [place] Phone: [phone]   Fax:  [phone]  Name: Keith Montoya MRN: [name] Date of Birth: January 15, 1976

## 2021-01-31 ENCOUNTER — Other Ambulatory Visit: Payer: Self-pay

## 2021-01-31 ENCOUNTER — Ambulatory Visit (HOSPITAL_BASED_OUTPATIENT_CLINIC_OR_DEPARTMENT_OTHER): Payer: PRIVATE HEALTH INSURANCE | Admitting: Physical Therapy

## 2021-01-31 DIAGNOSIS — M545 Low back pain, unspecified: Secondary | ICD-10-CM

## 2021-01-31 DIAGNOSIS — M542 Cervicalgia: Secondary | ICD-10-CM

## 2021-01-31 DIAGNOSIS — G8929 Other chronic pain: Secondary | ICD-10-CM

## 2021-02-01 NOTE — Therapy (Signed)
[facility] [address] [place], [place], [address] Phone: [phone]   Fax:  [phone]  Physical Therapy Treatment  Patient Details  Name: Keith Montoya MRN: [id number] Date of Birth: July 25, 1976 Referring Provider (PT): Romero Belling, MD   Encounter Date: 01/31/2021   PT End of Session - 02/01/21 0816    Visit Number 13    Number of Visits 13    Date for PT Re-Evaluation 02/07/21    Authorization Type UHC    PT Start Time 1600    PT Stop Time 1650    PT Time Calculation (min) 50 min    Activity Tolerance Patient tolerated treatment well    Behavior During Therapy [facility] for tasks assessed/performed           No past medical history on file.  No past surgical history on file.  There were no vitals filed for this visit.   Subjective Assessment - 01/31/21 1605    Subjective Pt states he did a lot of standing this past weekend and did not feel particularly well. Pt states he's feeling it in his L ITband and R lat. Pt states he did need to sit down once during his family event. Pt reports he was only able to stand for a minute or two before feeling his ITB.    Limitations Standing;Other (comment)    Currently in Pain? Yes    Pain Score 5     Pain Onset More than a month ago             Manual therapy:  TPR & STW Right lat and teres major  TPR & STW L ITB  IASTM L ITB  Foam rolling R lat and teres major  Therex:  Push up plus on knees 2x10  Side plank 2x20 sec bilat (ITB "zinging" with pelvic drop while performing on R)  Knee flexed hip extension with 4lbs 2x10  Sit<>stand staggered stance 2x10 with shoulders flexed  Sit<>stand x10 with orange tband low trap setting  Sit<>stand eccentric single leg x10 with shoulders flexed  Palloff press with side walking 5#s 2x10 to the R; x10 to the L (limited due to increased ITB "zinging")           PT Short Term Goals - 01/10/21 1656      PT SHORT TERM  GOAL #1   Title able to demonstrate and report he can maintain good posture at his desk when at work at least 35% of the time    Time 4    Period Weeks    Status On-going    Target Date 01/10/21      PT SHORT TERM GOAL #2   Title Pt will report 25% less pain in neck and shoulder and in back when standing for long periods of time    Time 4    Period Weeks    Status On-going    Target Date 01/10/21             PT Long Term Goals - 12/13/20 2012      PT LONG TERM GOAL #1   Title pt will be able to return to playing tennis due to imrpoved scpular and shoulder stability    Time 8    Period Weeks    Status New    Target Date 02/07/21      PT LONG TERM GOAL #2   Title pt will report 70% less pain when standing so he can attend  events for his kids    Time 8    Period Weeks    Status New    Target Date 02/07/21      PT LONG TERM GOAL #3   Title Pt will be ind with advanced HEP for pain management and strength routine to maintain his activity level    Time 8    Period Weeks    Status New    Target Date 02/07/21      PT LONG TERM GOAL #4   Title Pt will report he can work normal work day without feeling any neck or shoulder discomfort    Time 12    Period Weeks    Status New    Target Date 02/07/21                 Plan - 02/01/21 0813    Clinical Impression Statement Continued to manage pt's tightness with stretching, foam rolling and manual therapy. Increased time spent with standing strengthening exercises this session. Pt demos overly active R multifidi as well as R lat. Pt now able to perform sit to stand with staggered stance and no symptoms.    Personal Factors and Comorbidities Time since onset of injury/illness/exacerbation;Profession;Comorbidity 2    Comorbidities subacromial bone spur removal; ulnar nerve surgery Rt side    Examination-Activity Limitations Lift;Stand    Examination-Participation Restrictions Community Activity;Occupation     Stability/Clinical Decision Making Evolving/Moderate complexity    Rehab Potential Excellent    PT Frequency 2x / week    PT Duration 6 weeks    PT Treatment/Interventions ADLs/Self Care Home Management;Biofeedback;Cryotherapy;Electrical Stimulation;Iontophoresis 4mg /ml Dexamethasone;Moist Heat;Traction;Ultrasound;Therapeutic activities;Neuromuscular re-education;Therapeutic exercise;Patient/family education;Manual techniques;Passive range of motion;Dry needling;Taping    PT Next Visit Plan Check goals. Continue to address pelvic instability/posterior oblique sling. Manual therapy as needed. Continue to work and progress core/pelvic stabilization without ITB overactivity in standing/staggered stance and single leg stance. Continue to work on reducing lat overactivity with stretches, foam rolling, and strengthening (consider lower trap, serratus, RTC, anterior deltoid etc).    PT Home Exercise Plan Access Code: [email or web address]    Consulted and Agree with Plan of Care Patient           Patient will benefit from skilled therapeutic intervention in order to improve the following deficits and impairments:  Increased muscle spasms,Decreased strength,Postural dysfunction,Impaired UE functional use,Decreased range of motion,Pain  Visit Diagnosis: Cervicalgia  Chronic right shoulder pain  Chronic left-sided low back pain, unspecified whether sciatica present     Problem List There are no problems to display for this patient.   [facility] [address] PT, DPT 02/01/2021, 8:18 AM  [facility] [address] [place], [place], [place] Phone: [phone]   Fax:  [phone]  Name: Darrnell Mangiaracina MRN: [name] Date of Birth: 11/21/1975

## 2021-02-02 ENCOUNTER — Other Ambulatory Visit: Payer: Self-pay

## 2021-02-02 ENCOUNTER — Ambulatory Visit (HOSPITAL_BASED_OUTPATIENT_CLINIC_OR_DEPARTMENT_OTHER): Payer: PRIVATE HEALTH INSURANCE | Admitting: Physical Therapy

## 2021-02-02 DIAGNOSIS — M545 Low back pain, unspecified: Secondary | ICD-10-CM

## 2021-02-02 DIAGNOSIS — M542 Cervicalgia: Secondary | ICD-10-CM | POA: Diagnosis not present

## 2021-02-02 DIAGNOSIS — G8929 Other chronic pain: Secondary | ICD-10-CM

## 2021-02-03 NOTE — Therapy (Signed)
[facility] [address] [place], [place], [address] Phone: [phone]   Fax:  [phone]  Physical Therapy Treatment and Re-Certification  Patient Details  Name: Keith Montoya MRN: [id number] Date of Birth: [birth year] Referring Provider (PT): Laroy Apple, MD   Encounter Date: 02/02/2021   PT End of Session - 02/03/21 0905    Visit Number 14    Number of Visits 25    Date for PT Re-Evaluation 03/17/21    Authorization Type UHC    PT Start Time 1600    PT Stop Time 1650    PT Time Calculation (min) 50 min    Activity Tolerance Patient tolerated treatment well    Behavior During Therapy [facility] for tasks assessed/performed           No past medical history on file.  No past surgical history on file.  There were no vitals filed for this visit.   Subjective Assessment - 02/02/21 1604    Subjective Pt states hip and shoulder have been good. Pt reports he has been more aware of his desk arrangement.    Limitations Standing;Other (comment)    Currently in Pain? Yes    Pain Score 3     Pain Onset More than a month ago               Manual therapy:  STW and TPR R lat, R multifidi    Neuro Re-ed:  Staggered sit<>stand 2x10 on to chair  Attempted lunges x5 with 5 sec hold; however, pt unable to obtain glute activation  Palloff press 10# 2x10 bilat  Anti rotation with side stepping 5# 2x10 to the R only  Standing shoulder extension 2x10 with 25#  Quadruped alternating hip extension with 4# row R & L 2x10  Therex:  Child's pose x1 min; with thread the needle x30 sec  Double knee to chest x30 sec  Child's pose with low trap setting 2x10                 PT Short Term Goals - 02/03/21 0900      PT SHORT TERM GOAL #1   Title able to demonstrate and report he can maintain good posture at his desk when at work at least 35% of the time    Baseline Pt reports changing desk ergonomics to minimize lat  overactivity    Time 4    Period Weeks    Status Partially Met    Target Date 01/10/21      PT SHORT TERM GOAL #2   Title Pt will report 25% less pain in neck and shoulder and in back when standing for long periods of time    Baseline Pt with less "pain" and more "tightness" in R posterior shoulder    Time 4    Period Weeks    Status Partially Met    Target Date 01/10/21             PT Long Term Goals - 02/03/21 0901      PT LONG TERM GOAL #1   Title pt will be able to return to playing tennis due to imrpoved scpular and shoulder stability    Baseline posterior and anterior oblique motor control not consistent 3/23    Time 8    Period Weeks    Status On-going    Target Date 03/17/21      PT LONG TERM GOAL #2   Title pt will report 70% less  pain when standing so he can attend events for his kids    Baseline attempted standing for family event    Time 8    Period Weeks    Status On-going    Target Date 03/17/21      PT LONG TERM GOAL #3   Title Pt will be ind with advanced HEP for pain management and strength routine to maintain his activity level    Baseline Consistent with HEP 3/23    Time 8    Period Weeks    Status Partially Met    Target Date 03/17/21      PT LONG TERM GOAL #4   Title Pt will report he can work normal work day without feeling any neck or shoulder discomfort    Time 12    Period Weeks    Status On-going    Target Date 03/17/21                 Plan - 02/03/21 0854    Clinical Impression Statement Pt with improved standing tolerance during exercises without ITB overactivity. Pt not yet able to control core and glute activation to manage standing exercises requiring staggered stance. Pt requires rest in between sets with manual therapy for R lat to keep L ITB overactivity. PT continuing to progress pt's core stabilization in standing. Pt has yet to meet any PT goals. Pt would benefit from additional visits of PT to continue to improve  motor control of posterior and anterior oblique slings.    Personal Factors and Comorbidities Time since onset of injury/illness/exacerbation;Profession;Comorbidity 2    Comorbidities subacromial bone spur removal; ulnar nerve surgery Rt side    Examination-Activity Limitations Lift;Stand    Examination-Participation Restrictions Community Activity;Occupation    Stability/Clinical Decision Making Evolving/Moderate complexity    Rehab Potential Excellent    PT Frequency 2x / week    PT Duration 6 weeks    PT Treatment/Interventions ADLs/Self Care Home Management;Biofeedback;Cryotherapy;Electrical Stimulation;Iontophoresis 83m/ml Dexamethasone;Moist Heat;Traction;Ultrasound;Therapeutic activities;Neuromuscular re-education;Therapeutic exercise;Patient/family education;Manual techniques;Passive range of motion;Dry needling;Taping    PT Next Visit Plan Continue to address anterior and posterior oblique sling. Manual therapy as needed. Continue to work and progress core/pelvic stabilization without ITB overactivity in standing/staggered stance and single leg stance. Continue to work on reducing lat overactivity with stretches, foam rolling, and strengthening (consider lower trap, serratus, RTC, anterior deltoid etc).    PT Home Exercise Plan For R shoulder: Low trap setting in child's pose, modified push-up plus, RTC strengthening. For L ITB/core: Prone knee bent hip extensions, palloff press, eccentric staggered sit<>stand, standing shoulder extensions    Consulted and Agree with Plan of Care Patient           Patient will benefit from skilled therapeutic intervention in order to improve the following deficits and impairments:  Increased muscle spasms,Decreased strength,Postural dysfunction,Impaired UE functional use,Decreased range of motion,Pain  Visit Diagnosis: Cervicalgia  Chronic right shoulder pain  Chronic left-sided low back pain, unspecified whether sciatica present     Problem  List There are no problems to display for this patient.   GMountains Community HospitalA949 Woodland StreetPT, DPT 02/03/2021, 9:05 AM  CChildren[facility] Rehabilitation Center3675 North Tower LaneGTivoli NAl[place] [address]Phone: [phone]  Fax:  [phone] Name: Keith DannemillerMRN: [id number] of Birth: 104/24/1977

## 2021-02-03 NOTE — Addendum Note (Signed)
Addended by: Alease Medina L on: 02/03/2021 09:14 AM   Modules accepted: Orders

## 2021-02-07 ENCOUNTER — Ambulatory Visit (HOSPITAL_BASED_OUTPATIENT_CLINIC_OR_DEPARTMENT_OTHER): Payer: PRIVATE HEALTH INSURANCE | Admitting: Physical Therapy

## 2021-02-07 ENCOUNTER — Other Ambulatory Visit: Payer: Self-pay

## 2021-02-07 DIAGNOSIS — G8929 Other chronic pain: Secondary | ICD-10-CM

## 2021-02-07 DIAGNOSIS — M542 Cervicalgia: Secondary | ICD-10-CM

## 2021-02-07 DIAGNOSIS — M545 Low back pain, unspecified: Secondary | ICD-10-CM

## 2021-02-08 NOTE — Therapy (Signed)
[facility] [address] [place], [place], [address] Phone: [phone]   Fax:  [phone]  Physical Therapy Treatment  Patient Details  Name: Keith Montoya MRN: [id number] Date of Birth: Jul 04, 1976 Referring Provider (PT): Keith Montoya   Encounter Date: 02/07/2021   PT End of Session - 02/07/21 1605    Visit Number 15    Number of Visits 25    Date for PT Re-Evaluation 03/17/21    Authorization Type UHC    PT Start Time 5102    PT Stop Time 1650    PT Time Calculation (min) 45 min    Activity Tolerance Patient tolerated treatment well    Behavior During Therapy [facility] for tasks assessed/performed           No past medical history on file.  No past surgical history on file.  There were no vitals filed for this visit.   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 Pt reports Thurs-Fri he was able to do all of the exercises. Pt states he felt really good. Sat-Sun he was able to stretch but did not get to do strengthening.    Limitations Standing;Other (comment)    Pain Onset More than a month ago                  Manual therapy:  STW and TPR R lat, R multifidi  STW and TPR L ITB  Neuro Re-ed:  Palloff press 15# 2x10 bilat  Anti rotation with side stepping 10# 2x10 to the R only; attempted x10 to L  Therex:  Double knee to chest x30 sec  Supine R lat stretch x30 sec  Bridging with heels on 6" platform 3x10  SL bridging with heels on 6" platform x10 L&R              PT Short Term Goals - 02/03/21 0900      PT SHORT TERM GOAL #1   Title able to demonstrate and report he can maintain good posture at his desk when at work at least 35% of the time    Baseline Pt reports changing desk ergonomics to minimize lat overactivity    Time 4    Period Weeks    Status Partially Met    Target Date 01/10/21      PT  SHORT TERM GOAL #2   Title Pt will report 25% less pain in neck and shoulder and in back when standing for long periods of time    Baseline Pt with less "pain" and more "tightness" in R posterior shoulder    Time 4    Period Weeks    Status Partially Met    Target Date 01/10/21             PT Long Term Goals - 02/03/21 0901      PT LONG TERM GOAL #1   Title pt will be able to return to playing tennis due to imrpoved scpular and shoulder stability    Baseline posterior and anterior oblique motor control not consistent 3/23    Time 8    Period Weeks    Status On-going    Target Date 03/17/21      PT LONG TERM GOAL #2   Title pt will report 70% less pain when standing so he can attend events for his kids    Baseline attempted standing for  family event    Time 8    Period Weeks    Status On-going    Target Date 03/17/21      PT LONG TERM GOAL #3   Title Pt will be ind with advanced HEP for pain management and strength routine to maintain his activity level    Baseline Consistent with HEP 3/23    Time 8    Period Weeks    Status Partially Met    Target Date 03/17/21      PT LONG TERM GOAL #4   Title Pt will report he can work normal work day without feeling any neck or shoulder discomfort    Time 12    Period Weeks    Status On-going    Target Date 03/17/21                 Plan - 02/08/21 0756    Clinical Impression Statement Continued treatment efforts to increase core stabilization in anterior and posterior sling in standing position and glute strengthening. Pt able to side step to R with core stabilization without L lateral thigh "zinging"; however, pt unable to perform L side stepping without symptoms.    Personal Factors and Comorbidities Time since onset of injury/illness/exacerbation;Profession;Comorbidity 2    Comorbidities subacromial bone spur removal; ulnar nerve surgery Rt side    Examination-Activity Limitations Lift;Stand     Examination-Participation Restrictions Community Activity;Occupation    Stability/Clinical Decision Making Evolving/Moderate complexity    Rehab Potential Excellent    PT Frequency 2x / week    PT Duration 6 weeks    PT Treatment/Interventions ADLs/Self Care Home Management;Biofeedback;Cryotherapy;Electrical Stimulation;Iontophoresis 21m/ml Dexamethasone;Moist Heat;Traction;Ultrasound;Therapeutic activities;Neuromuscular re-education;Therapeutic exercise;Patient/family education;Manual techniques;Passive range of motion;Dry needling;Taping    PT Next Visit Plan Continue to address anterior and posterior oblique sling. Manual therapy as needed. Continue to work and progress core/pelvic stabilization without ITB overactivity in standing/staggered stance and single leg stance. Continue to work on reducing lat overactivity with stretches, foam rolling, and strengthening (consider lower trap, serratus, RTC, anterior deltoid etc).    PT Home Exercise Plan For R shoulder: Low trap setting in child's pose, modified push-up plus, RTC strengthening. For L ITB/core: Prone knee bent hip extensions, palloff press, eccentric staggered sit<>stand, standing shoulder extensions    Consulted and Agree with Plan of Care Patient           Patient will benefit from skilled therapeutic intervention in order to improve the following deficits and impairments:  Increased muscle spasms,Decreased strength,Postural dysfunction,Impaired UE functional use,Decreased range of motion,Pain  Visit Diagnosis: Cervicalgia  Chronic right shoulder pain  Chronic left-sided low back pain, unspecified whether sciatica present     Problem List There are no problems to display for this patient.   GChristus Spohn Hospital Corpus ChristiA[address]NSardiniaPT, DPT 02/08/2021, 8:02 AM  CSt [facility]G[place] [address]Phone: [phone]  Fax:  [phone] Name: MKebron Montoya:  [id number] of Birth: 126-Oct-1977

## 2021-02-09 ENCOUNTER — Other Ambulatory Visit: Payer: Self-pay

## 2021-02-09 ENCOUNTER — Ambulatory Visit (HOSPITAL_BASED_OUTPATIENT_CLINIC_OR_DEPARTMENT_OTHER): Payer: PRIVATE HEALTH INSURANCE | Admitting: Physical Therapy

## 2021-02-09 DIAGNOSIS — M542 Cervicalgia: Secondary | ICD-10-CM

## 2021-02-09 DIAGNOSIS — G8929 Other chronic pain: Secondary | ICD-10-CM

## 2021-02-11 NOTE — Therapy (Signed)
[facility] [address] [place], [place], [address] Phone: [phone]   Fax:  [phone]  Physical Therapy Treatment  Patient Details  Name: Keith Montoya MRN: [id number] Date of Birth: Jan 20, 1976 Referring Provider (PT): Keith Apple, MD   Encounter Date: 02/09/2021   PT End of Session - 02/11/21 0827    Number of Visits 25    Date for PT Re-Evaluation 03/17/21    Authorization Type UHC    Activity Tolerance Patient tolerated treatment well    Behavior During Therapy [facility] for tasks assessed/performed           No past medical history on file.  No past surgical history on file.  There were no vitals filed for this visit.   Subjective Assessment - 02/11/21 0826    Subjective Pt states he was able to do yard work without left leg "zinging". R shoulder continues to get tight after work day but nothing else new    Limitations Standing;Other (comment)    Pain Onset More than a month ago              Therex:  Palloff press with side stepping 3x10 bilat 10#  Single leg mini squat with contralateral shoulder extension 2x10 bilat  Abdominal machine strengthening 2x10 @ 55#  Half kneeling with contralateral shoulder extension 2x10 @ 10#            Trigger Point Dry Needling - 02/11/21 0001    Consent Given? Yes    Education Handout Provided Yes    Muscles Treated Upper Quadrant Subscapularis;Latissimus dorsi    Dry Needling Comments TPDN provided by Keith Montoya    Subscapularis Response Twitch response elicited;Palpable increased muscle length    Latissimus dorsi Response Twitch response elicited;Palpable increased muscle length                  PT Short Term Goals - 02/03/21 0900      PT SHORT TERM GOAL #1   Title able to demonstrate and report he can maintain good posture at his desk when at work at least 35% of the time    Baseline Pt reports changing desk ergonomics to minimize  lat overactivity    Time 4    Period Weeks    Status Partially Met    Target Date 01/10/21      PT SHORT TERM GOAL #2   Title Pt will report 25% less pain in neck and shoulder and in back when standing for long periods of time    Baseline Pt with less "pain" and more "tightness" in R posterior shoulder    Time 4    Period Weeks    Status Partially Met    Target Date 01/10/21             PT Long Term Goals - 02/03/21 0901      PT LONG TERM GOAL #1   Title pt will be able to return to playing tennis due to imrpoved scpular and shoulder stability    Baseline posterior and anterior oblique motor control not consistent 3/23    Time 8    Period Weeks    Status On-going    Target Date 03/17/21      PT LONG TERM GOAL #2   Title pt will report 70% less pain when standing so he can attend events for his kids    Baseline attempted standing for family event    Time 8  Period Weeks    Status On-going    Target Date 03/17/21      PT LONG TERM GOAL #3   Title Pt will be ind with advanced HEP for pain management and strength routine to maintain his activity level    Baseline Consistent with HEP 3/23    Time 8    Period Weeks    Status Partially Met    Target Date 03/17/21      PT LONG TERM GOAL #4   Title Pt will report he can work normal work day without feeling any neck or shoulder discomfort    Time 12    Period Weeks    Status On-going    Target Date 03/17/21                 Plan - 02/11/21 0826    Clinical Impression Statement Treatment continues to focus on stabilization for shoulder, hip, and anterior/posterior sling. Dry needling provided to assist with posterior shoulder tightness. Able to perform strengthening exercises in half kneeling position this session with limited symptoms (last attempt, pt with immediate "zinging").    Personal Factors and Comorbidities Time since onset of injury/illness/exacerbation;Profession;Comorbidity 2    Comorbidities  subacromial bone spur removal; ulnar nerve surgery Rt side    Examination-Activity Limitations Lift;Stand    Examination-Participation Restrictions Community Activity;Occupation    Stability/Clinical Decision Making Evolving/Moderate complexity    Rehab Potential Excellent    PT Frequency 2x / week    PT Duration 6 weeks    PT Treatment/Interventions ADLs/Self Care Home Management;Biofeedback;Cryotherapy;Electrical Stimulation;Iontophoresis 27m/ml Dexamethasone;Moist Heat;Traction;Ultrasound;Therapeutic activities;Neuromuscular re-education;Therapeutic exercise;Patient/family education;Manual techniques;Passive range of motion;Dry needling;Taping    PT Next Visit Plan Continue to address anterior and posterior oblique sling. Manual therapy as needed. Continue to work and progress core/pelvic stabilization without ITB overactivity in standing/staggered stance and single leg stance. Continue to work on reducing lat overactivity with stretches, foam rolling, and strengthening (consider lower trap, serratus, RTC, anterior deltoid etc).    PT Home Exercise Plan For R shoulder: Low trap setting in child's pose, modified push-up plus, RTC strengthening. For L ITB/core: Prone knee bent hip extensions, palloff press, eccentric staggered sit<>stand, standing shoulder extensions    Consulted and Agree with Plan of Care Patient           Patient will benefit from skilled therapeutic intervention in order to improve the following deficits and impairments:  Increased muscle spasms,Decreased strength,Postural dysfunction,Impaired UE functional use,Decreased range of motion,Pain  Visit Diagnosis: Cervicalgia  Chronic right shoulder pain  Chronic left-sided low back pain, unspecified whether sciatica present     Problem List There are no problems to display for this patient.   GCarolina Continuecare At UniversityA[address]PT, DPT 02/11/2021, 8:28 AM  CKau[facility] Hospital3[place] [address]Phone: [phone]  Fax:  [phone] Name: MNathyn LuizMRN: [id number] of Birth: 1Nov 20, 1977

## 2021-02-14 ENCOUNTER — Other Ambulatory Visit: Payer: Self-pay

## 2021-02-14 ENCOUNTER — Ambulatory Visit (HOSPITAL_BASED_OUTPATIENT_CLINIC_OR_DEPARTMENT_OTHER): Payer: PRIVATE HEALTH INSURANCE | Attending: Physical Medicine and Rehabilitation | Admitting: Physical Therapy

## 2021-02-14 ENCOUNTER — Encounter (HOSPITAL_BASED_OUTPATIENT_CLINIC_OR_DEPARTMENT_OTHER): Payer: Self-pay | Admitting: Physical Therapy

## 2021-02-14 DIAGNOSIS — M545 Low back pain, unspecified: Secondary | ICD-10-CM | POA: Diagnosis present

## 2021-02-14 DIAGNOSIS — M542 Cervicalgia: Secondary | ICD-10-CM | POA: Insufficient documentation

## 2021-02-14 DIAGNOSIS — G8929 Other chronic pain: Secondary | ICD-10-CM | POA: Diagnosis present

## 2021-02-14 DIAGNOSIS — M25511 Pain in right shoulder: Secondary | ICD-10-CM | POA: Insufficient documentation

## 2021-02-14 NOTE — Therapy (Signed)
[facility] [address] [place], [place], [address] Phone: [phone]   Fax:  [phone]  Physical Therapy Treatment  Patient Details  Name: Keith Montoya MRN: [id number] Date of Birth: December 28, 1975 Referring Provider (PT): Laroy Apple, MD   Encounter Date: 02/14/2021   PT End of Session - 02/14/21 1557    Visit Number 18    Number of Visits 25    Date for PT Re-Evaluation 03/17/21    Authorization Type UHC    PT Start Time 2924    PT Stop Time 1636    PT Time Calculation (min) 41 min    Activity Tolerance Patient tolerated treatment well    Behavior During Therapy [facility] for tasks assessed/performed           History reviewed. No pertinent past medical history.  History reviewed. No pertinent surgical history.  There were no vitals filed for this visit.   Subjective Assessment - 02/14/21 1557    Subjective I was sore the next day. Martin Majestic out and played on Saturday and did not feel zinging in hip but felt tightness in Lt quads.    Currently in Pain? No/denies                             [facility] [address] Adult PT Treatment/Exercise - 02/14/21 0001      Lumbar Exercises: Stretches   Other Lumbar Stretch Exercise quad stretch standing    Other Lumbar Stretch Exercise passive supine stretching bil LEs      Lumbar Exercises: Aerobic   Other Aerobic Exercise sci fit 2.2 5 min      Lumbar Exercises: Standing   Other Standing Lumbar Exercises PRI: st unsupported Rt lift with Rt trunk rot    Other Standing Lumbar Exercises squat pull off short bar, up to chair with exhale- press Rt hip back      Lumbar Exercises: Quadruped   Plank elbows/toes    Other Quadruped Lumbar Exercises kneeling D2 flx resisted, high kneeling pallof press      Manual Therapy   Soft tissue mobilization Rt upper trap, Rt lats stretching/stm                    PT Short Term Goals - 02/03/21 0900      PT SHORT TERM GOAL  #1   Title able to demonstrate and report he can maintain good posture at his desk when at work at least 35% of the time    Baseline Pt reports changing desk ergonomics to minimize lat overactivity    Time 4    Period Weeks    Status Partially Met    Target Date 01/10/21      PT SHORT TERM GOAL #2   Title Pt will report 25% less pain in neck and shoulder and in back when standing for long periods of time    Baseline Pt with less "pain" and more "tightness" in R posterior shoulder    Time 4    Period Weeks    Status Partially Met    Target Date 01/10/21             PT Long Term Goals - 02/03/21 0901      PT LONG TERM GOAL #1   Title pt will be able to return to playing tennis due to imrpoved scpular and shoulder stability    Baseline posterior and anterior oblique motor control not consistent  3/23    Time 8    Period Weeks    Status On-going    Target Date 03/17/21      PT LONG TERM GOAL #2   Title pt will report 70% less pain when standing so he can attend events for his kids    Baseline attempted standing for family event    Time 8    Period Weeks    Status On-going    Target Date 03/17/21      PT LONG TERM GOAL #3   Title Pt will be ind with advanced HEP for pain management and strength routine to maintain his activity level    Baseline Consistent with HEP 3/23    Time 8    Period Weeks    Status Partially Met    Target Date 03/17/21      PT LONG TERM GOAL #4   Title Pt will report he can work normal work day without feeling any neck or shoulder discomfort    Time 12    Period Weeks    Status On-going    Target Date 03/17/21                 Plan - 02/14/21 1646    Clinical Impression Statement Added PRI exercises which required multiple tactile cues- focus to decrease Lt lower rib cage flare, expand Rt post rib cage and activate Rt hip abductors.    PT Treatment/Interventions ADLs/Self Care Home Management;Biofeedback;Cryotherapy;Electrical  Stimulation;Iontophoresis 25m/ml Dexamethasone;Moist Heat;Traction;Ultrasound;Therapeutic activities;Neuromuscular re-education;Therapeutic exercise;Patient/family education;Manual techniques;Passive range of motion;Dry needling;Taping    PT Next Visit Plan Continue to address anterior and posterior oblique sling. Manual therapy as needed. Continue to work and progress core/pelvic stabilization without ITB overactivity in standing/staggered stance and single leg stance. Continue to work on reducing lat overactivity with stretches, foam rolling, and strengthening (consider lower trap, serratus, RTC, anterior deltoid etc).    PT Home Exercise Plan For R shoulder: Low trap setting in child's pose, modified push-up plus, RTC strengthening. For L ITB/core: Prone knee bent hip extensions, palloff press, eccentric staggered sit<>stand, standing shoulder extensions, PRI St unsupported Rt lift with Rt trunk rot.    Consulted and Agree with Plan of Care Patient           Patient will benefit from skilled therapeutic intervention in order to improve the following deficits and impairments:  Increased muscle spasms,Decreased strength,Postural dysfunction,Impaired UE functional use,Decreased range of motion,Pain  Visit Diagnosis: Cervicalgia  Chronic right shoulder pain  Chronic left-sided low back pain, unspecified whether sciatica present     Problem List There are no problems to display for this patient.  Thorsten Climer C. Neosha Switalski PT, DPT 02/14/21 4:50 PM   [facility]Re[facility] [address]arrt[place] [address]Phone: [phone]  Fax:  [phone] Name: Keith TrevathanMRN: [id number] of Birth: 1Apr 18, 1977

## 2021-02-18 ENCOUNTER — Ambulatory Visit (HOSPITAL_BASED_OUTPATIENT_CLINIC_OR_DEPARTMENT_OTHER): Payer: PRIVATE HEALTH INSURANCE | Admitting: Physical Therapy

## 2021-02-22 ENCOUNTER — Other Ambulatory Visit: Payer: Self-pay

## 2021-02-22 ENCOUNTER — Ambulatory Visit (HOSPITAL_BASED_OUTPATIENT_CLINIC_OR_DEPARTMENT_OTHER): Payer: PRIVATE HEALTH INSURANCE | Admitting: Physical Therapy

## 2021-02-22 ENCOUNTER — Encounter (HOSPITAL_BASED_OUTPATIENT_CLINIC_OR_DEPARTMENT_OTHER): Payer: Self-pay | Admitting: Physical Therapy

## 2021-02-22 DIAGNOSIS — M545 Low back pain, unspecified: Secondary | ICD-10-CM

## 2021-02-22 DIAGNOSIS — G8929 Other chronic pain: Secondary | ICD-10-CM

## 2021-02-22 DIAGNOSIS — M542 Cervicalgia: Secondary | ICD-10-CM

## 2021-02-22 DIAGNOSIS — M25511 Pain in right shoulder: Secondary | ICD-10-CM

## 2021-02-22 NOTE — Therapy (Signed)
[facility] [address] [place], [place], [address] Phone: [phone]   Fax:  [phone]  Physical Therapy Treatment  Patient Details  Name: Keith Montoya MRN: [id number] Date of Birth: September 13, 1976 Referring Provider (PT): Keith Apple, MD   Encounter Date: 02/22/2021   PT End of Session - 02/22/21 1557    Visit Number 19    Number of Visits 25    Date for PT Re-Evaluation 03/17/21    Authorization Type UHC    PT Start Time 7867    PT Stop Time 1643    PT Time Calculation (min) 50 min    Activity Tolerance Patient tolerated treatment well    Behavior During Therapy [facility] for tasks assessed/performed           History reviewed. No pertinent past medical history.  History reviewed. No pertinent surgical history.  There were no vitals filed for this visit.   Subjective Assessment - 02/22/21 1554    Subjective Did not do much last week due to being sick. Played Sunday and felt tightness in quads but no hip pain. Really feeling it in Right lats.    Currently in Pain? Yes    Pain Location Shoulder    Pain Orientation Posterior                             [facility] Adult PT Treatment/Exercise - 02/22/21 0001      Lumbar Exercises: Aerobic   Other Aerobic Exercise sci fit2.1 5 min      Lumbar Exercises: Standing   Other Standing Lumbar Exercises PRI: st unsupported Rt lift with Rt trunk rot      Shoulder Exercises: Standing   Other Standing Exercises IR/ER at 90 abd orange tband      Manual Therapy   Manual therapy comments skilled palpation and monitoring during TPDN    Soft tissue mobilization all muscles dry needled            Trigger Point Dry Needling - 02/22/21 0001    Muscles Treated Upper Quadrant Infraspinatus    Muscles Treated Lower Quadrant Quadriceps    Infraspinatus Response Twitch response elicited;Palpable increased muscle length   Right   Latissimus dorsi Response Twitch  response elicited;Palpable increased muscle length   Rt   Quadriceps Response Twitch response elicited;Palpable increased muscle length   Lt all 4                 PT Short Term Goals - 02/03/21 0900      PT SHORT TERM GOAL #1   Title able to demonstrate and report he can maintain good posture at his desk when at work at least 35% of the time    Baseline Pt reports changing desk ergonomics to minimize lat overactivity    Time 4    Period Weeks    Status Partially Met    Target Date 01/10/21      PT SHORT TERM GOAL #2   Title Pt will report 25% less pain in neck and shoulder and in back when standing for long periods of time    Baseline Pt with less "pain" and more "tightness" in R posterior shoulder    Time 4    Period Weeks    Status Partially Met    Target Date 01/10/21             PT Long Term Goals - 02/03/21 0901  PT LONG TERM GOAL #1   Title pt will be able to return to playing tennis due to imrpoved scpular and shoulder stability    Baseline posterior and anterior oblique motor control not consistent 3/23    Time 8    Period Weeks    Status On-going    Target Date 03/17/21      PT LONG TERM GOAL #2   Title pt will report 70% less pain when standing so he can attend events for his kids    Baseline attempted standing for family event    Time 8    Period Weeks    Status On-going    Target Date 03/17/21      PT LONG TERM GOAL #3   Title Pt will be ind with advanced HEP for pain management and strength routine to maintain his activity level    Baseline Consistent with HEP 3/23    Time 8    Period Weeks    Status Partially Met    Target Date 03/17/21      PT LONG TERM GOAL #4   Title Pt will report he can work normal work day without feeling any neck or shoulder discomfort    Time 12    Period Weeks    Status On-going    Target Date 03/17/21                 Plan - 02/22/21 1645    Clinical Impression Statement Large twitches in  multiple fibers of infraspinatus and lats on Rt side. Also twitched in all 4 bellies of quads. Noted that he would feel the discomfort in his Lt leg when he lost control of GHJ in ER/IR at 90 ABD. [place] the beach next [place]- focus on PRI exercise and IR/ER until return.    PT Treatment/Interventions ADLs/Self Care Home Management;Biofeedback;Cryotherapy;Electrical Stimulation;Iontophoresis 18m/ml Dexamethasone;Moist Heat;Traction;Ultrasound;Therapeutic activities;Neuromuscular re-education;Therapeutic exercise;Patient/family education;Manual techniques;Passive range of motion;Dry needling;Taping    PT Next Visit Plan DN PRN, GHJ stability    PT Home Exercise Plan For R shoulder: Low trap setting in child's pose, modified push-up plus, RTC strengthening. For L ITB/core: Prone knee bent hip extensions, palloff press, eccentric staggered sit<>stand, standing shoulder extensions, PRI St unsupported Rt lift with Rt trunk rot. IR/ER at 90 abd orange tband    Consulted and Agree with Plan of Care Patient           Patient will benefit from skilled therapeutic intervention in order to improve the following deficits and impairments:  Increased muscle spasms,Decreased strength,Postural dysfunction,Impaired UE functional use,Decreased range of motion,Pain  Visit Diagnosis: Cervicalgia  Chronic right shoulder pain  Chronic left-sided low back pain, unspecified whether sciatica present     Problem List There are no problems to display for this patient.   Keith Montoya PT, DPT 02/22/21 5:57 PM   [facility] [address] [address]Phone: [phone]  Fax:  [phone] Name: Keith DennardMRN: [id number] of Birth: [birth year]

## 2021-03-08 ENCOUNTER — Other Ambulatory Visit: Payer: Self-pay

## 2021-03-08 ENCOUNTER — Ambulatory Visit (HOSPITAL_BASED_OUTPATIENT_CLINIC_OR_DEPARTMENT_OTHER): Payer: PRIVATE HEALTH INSURANCE | Admitting: Physical Therapy

## 2021-03-08 ENCOUNTER — Encounter (HOSPITAL_BASED_OUTPATIENT_CLINIC_OR_DEPARTMENT_OTHER): Payer: Self-pay | Admitting: Physical Therapy

## 2021-03-08 DIAGNOSIS — M542 Cervicalgia: Secondary | ICD-10-CM | POA: Diagnosis not present

## 2021-03-08 DIAGNOSIS — G8929 Other chronic pain: Secondary | ICD-10-CM

## 2021-03-08 DIAGNOSIS — M545 Low back pain, unspecified: Secondary | ICD-10-CM

## 2021-03-08 NOTE — Therapy (Signed)
[facility] [address] [place], [place], [address] Phone: [phone]   Fax:  [phone]  Physical Therapy Treatment  Patient Details  Name: Keith Montoya MRN: [id number] Date of Birth: June 15, 1976 Referring Provider (PT): Laroy Apple, MD   Encounter Date: 03/08/2021   PT End of Session - 03/08/21 1616    Visit Number 20    Number of Visits 25    Date for PT Re-Evaluation 03/17/21    Authorization Type UHC    PT Start Time 0086    PT Stop Time 1645    PT Time Calculation (min) 44 min    Activity Tolerance Patient tolerated treatment well    Behavior During Therapy [facility] for tasks assessed/performed           History reviewed. No pertinent past medical history.  History reviewed. No pertinent surgical history.  There were no vitals filed for this visit.   Subjective Assessment - 03/08/21 1655    Subjective No zingers in the hip but feeling tightness in bilat quads, spot on the back of my right shoulder still there but better                             [facility] Adult PT Treatment/Exercise - 03/08/21 0001      Lumbar Exercises: Standing   Other Standing Lumbar Exercises PRI: st unsupported Rt lift with Rt trunk rot   added 3 breath rounds     Shoulder Exercises: Prone   Other Prone Exercises W,Y      Manual Therapy   Manual therapy comments skilled palpation and monitoring during TPDN    Soft tissue mobilization Rt infraspinatus, Rt lower traps & lower thoracic paraspinals            Trigger Point Dry Needling - 03/08/21 0001    Infraspinatus Response Twitch response elicited;Palpable increased muscle length   Rt                 PT Short Term Goals - 02/03/21 0900      PT SHORT TERM GOAL #1   Title able to demonstrate and report he can maintain good posture at his desk when at work at least 35% of the time    Baseline Pt reports changing desk ergonomics to minimize lat  overactivity    Time 4    Period Weeks    Status Partially Met    Target Date 01/10/21      PT SHORT TERM GOAL #2   Title Pt will report 25% less pain in neck and shoulder and in back when standing for long periods of time    Baseline Pt with less "pain" and more "tightness" in R posterior shoulder    Time 4    Period Weeks    Status Partially Met    Target Date 01/10/21             PT Long Term Goals - 02/03/21 0901      PT LONG TERM GOAL #1   Title pt will be able to return to playing tennis due to imrpoved scpular and shoulder stability    Baseline posterior and anterior oblique motor control not consistent 3/23    Time 8    Period Weeks    Status On-going    Target Date 03/17/21      PT LONG TERM GOAL #2   Title pt will report 70% less pain  when standing so he can attend events for his kids    Baseline attempted standing for family event    Time 8    Period Weeks    Status On-going    Target Date 03/17/21      PT LONG TERM GOAL #3   Title Pt will be ind with advanced HEP for pain management and strength routine to maintain his activity level    Baseline Consistent with HEP 3/23    Time 8    Period Weeks    Status Partially Met    Target Date 03/17/21      PT LONG TERM GOAL #4   Title Pt will report he can work normal work day without feeling any neck or shoulder discomfort    Time 12    Period Weeks    Status On-going    Target Date 03/17/21                 Plan - 03/08/21 1649    Clinical Impression Statement Able to demo good form with PRI exercise so added breathing and rib control for incr challenge. Tightness in bil quads with better stretch while knee was on pillow to lengthen hip flexors. prone periscap exercises with focus on periscap activation rather than use of avail motion in GHJ.    PT Treatment/Interventions ADLs/Self Care Home Management;Biofeedback;Cryotherapy;Electrical Stimulation;Iontophoresis 76m/ml Dexamethasone;Moist  Heat;Traction;Ultrasound;Therapeutic activities;Neuromuscular re-education;Therapeutic exercise;Patient/family education;Manual techniques;Passive range of motion;Dry needling;Taping    PT Home Exercise Plan For R shoulder: Low trap setting in child's pose, modified push-up plus, RTC strengthening. For L ITB/core: Prone knee bent hip extensions, palloff press, eccentric staggered sit<>stand, standing shoulder extensions, PRI St unsupported Rt lift with Rt trunk rot. IR/ER at 90 abd orange tband, prone W, Y    Consulted and Agree with Plan of Care Patient           Patient will benefit from skilled therapeutic intervention in order to improve the following deficits and impairments:  Increased muscle spasms,Decreased strength,Postural dysfunction,Impaired UE functional use,Decreased range of motion,Pain  Visit Diagnosis: Cervicalgia  Chronic right shoulder pain  Chronic left-sided low back pain, unspecified whether sciatica present     Problem List There are no problems to display for this patient. Felicha Frayne C. Dustina Scoggin PT, DPT 03/08/21 5:32 PM   [facility] [address] [address]Phone: [phone]  Fax:  [phone] Name: Keith SchepersMRN: [id number] of Birth: 109-15-1977

## 2021-03-11 ENCOUNTER — Encounter (HOSPITAL_BASED_OUTPATIENT_CLINIC_OR_DEPARTMENT_OTHER): Payer: Self-pay | Admitting: Physical Therapy

## 2021-03-11 ENCOUNTER — Other Ambulatory Visit: Payer: Self-pay

## 2021-03-11 ENCOUNTER — Ambulatory Visit (HOSPITAL_BASED_OUTPATIENT_CLINIC_OR_DEPARTMENT_OTHER): Payer: PRIVATE HEALTH INSURANCE | Admitting: Physical Therapy

## 2021-03-11 DIAGNOSIS — G8929 Other chronic pain: Secondary | ICD-10-CM

## 2021-03-11 DIAGNOSIS — M542 Cervicalgia: Secondary | ICD-10-CM

## 2021-03-11 DIAGNOSIS — M25511 Pain in right shoulder: Secondary | ICD-10-CM

## 2021-03-11 NOTE — Therapy (Signed)
Ke[facility] Montoya [address] [place], [place], [address] Phone: [phone]   Fax:  [phone]  Physical Therapy Treatment  Patient Details  Name: Keith Montoya MRN: [id number] Date of Birth: 06-09-1976 Referring Provider (PT): Keith Apple, MD   Encounter Date: 03/11/2021   PT End of Session - 03/11/21 0811    Visit Number 21    Number of Visits 25    Date for PT Re-Evaluation 03/17/21    Authorization Type UHC    PT Start Time 0810    PT Stop Time 0848    PT Time Calculation (min) 38 min    Activity Tolerance Patient tolerated treatment well    Behavior During Therapy [facility] for tasks assessed/performed           History reviewed. No pertinent past medical history.  History reviewed. No pertinent surgical history.  There were no vitals filed for this visit.   Subjective Assessment - 03/11/21 0812    Subjective Quads feel less tight right now. tightness in lower traps/lats. more soreness into lower scapular region on Rt.                             [facility] Adult PT Treatment/Exercise - 03/11/21 0001      Shoulder Exercises: Standing   Horizontal ABduction Strengthening;Right    Theraband Level (Shoulder Horizontal ABduction) Level 2 (Red)    Horizontal ABduction Limitations tactile cues required    External Rotation Limitations ER/IR at 90 abd red tband    Other Standing Exercises PRI: lat stretch in seated      Shoulder Exercises: ROM/Strengthening   Lat Pull Limitations 20 lb, cues for scap depression & retraction      Manual Therapy   Manual therapy comments skilled palpation and monitoring during TPDN    Soft tissue mobilization Rt infraspinatus            Trigger Point Dry Needling - 03/11/21 0001    Infraspinatus Response Twitch response elicited;Palpable increased muscle length   Rt                 PT Short Term Goals - 02/03/21 0900      PT SHORT TERM GOAL #1   Title  able to demonstrate and report he can maintain good posture at his desk when at work at least 35% of the time    Baseline Pt reports changing desk ergonomics to minimize lat overactivity    Time 4    Period Weeks    Status Partially Met    Target Date 01/10/21      PT SHORT TERM GOAL #2   Title Pt will report 25% less pain in neck and shoulder and in back when standing for long periods of time    Baseline Pt with less "pain" and more "tightness" in R posterior shoulder    Time 4    Period Weeks    Status Partially Met    Target Date 01/10/21             PT Long Term Goals - 02/03/21 0901      PT LONG TERM GOAL #1   Title pt will be able to return to playing tennis due to imrpoved scpular and shoulder stability    Baseline posterior and anterior oblique motor control not consistent 3/23    Time 8    Period Weeks    Status On-going  Target Date 03/17/21      PT LONG TERM GOAL #2   Title pt will report 70% less pain when standing so he can attend events for his kids    Baseline attempted standing for family event    Time 8    Period Weeks    Status On-going    Target Date 03/17/21      PT LONG TERM GOAL #3   Title Pt will be ind with advanced HEP for pain management and strength routine to maintain his activity level    Baseline Consistent with HEP 3/23    Time 8    Period Weeks    Status Partially Met    Target Date 03/17/21      PT LONG TERM GOAL #4   Title Pt will report he can work normal work day without feeling any neck or shoulder discomfort    Time 12    Period Weeks    Status On-going    Target Date 03/17/21                 Plan - 03/11/21 1525    Clinical Impression Statement tactile cues for GHJ stability in standing shoulder exercises as he uses hypermobility to avoid scapular mobility. Was able to begin recognizing scapular motion but began feeling pain in Left leg- resolved with added PRI exercise.    PT Treatment/Interventions ADLs/Self  Care Home Management;Biofeedback;Cryotherapy;Electrical Stimulation;Iontophoresis 31m/ml Dexamethasone;Moist Heat;Traction;Ultrasound;Therapeutic activities;Neuromuscular re-education;Therapeutic exercise;Patient/family education;Manual techniques;Passive range of motion;Dry needling;Taping    PT Home Exercise Plan For R shoulder: Low trap setting in child's pose, modified push-up plus, RTC strengthening. For L ITB/core: Prone knee bent hip extensions, palloff press, eccentric staggered sit<>stand, standing shoulder extensions, PRI St unsupported Rt lift with Rt trunk rot. IR/ER at 90 abd orange tband, prone W, Y; PRI lat hang    Consulted and Agree with Plan of Care Patient           Patient will benefit from skilled therapeutic intervention in order to improve the following deficits and impairments:  Increased muscle spasms,Decreased strength,Postural dysfunction,Impaired UE functional use,Decreased range of motion,Pain  Visit Diagnosis: Cervicalgia  Chronic right shoulder pain  Chronic left-sided low back pain, unspecified whether sciatica present     Problem List There are no problems to display for this patient.   Keith Montoya PT, DPT 03/11/21 3:27 PM   [facility] [address] [address]Phone: [phone]  Fax:  [phone] Name: MMarquis DownMRN: [id number] of Birth: [birth year]

## 2021-03-14 ENCOUNTER — Other Ambulatory Visit: Payer: Self-pay

## 2021-03-14 ENCOUNTER — Encounter (HOSPITAL_BASED_OUTPATIENT_CLINIC_OR_DEPARTMENT_OTHER): Payer: Self-pay | Admitting: Physical Therapy

## 2021-03-14 ENCOUNTER — Ambulatory Visit (HOSPITAL_BASED_OUTPATIENT_CLINIC_OR_DEPARTMENT_OTHER): Payer: PRIVATE HEALTH INSURANCE | Attending: Physical Medicine and Rehabilitation | Admitting: Physical Therapy

## 2021-03-14 DIAGNOSIS — M25511 Pain in right shoulder: Secondary | ICD-10-CM | POA: Insufficient documentation

## 2021-03-14 DIAGNOSIS — M545 Low back pain, unspecified: Secondary | ICD-10-CM | POA: Insufficient documentation

## 2021-03-14 DIAGNOSIS — M542 Cervicalgia: Secondary | ICD-10-CM | POA: Diagnosis not present

## 2021-03-14 DIAGNOSIS — G8929 Other chronic pain: Secondary | ICD-10-CM | POA: Diagnosis present

## 2021-03-14 NOTE — Therapy (Signed)
[facility] [address] [place], [place], [address] Phone: [phone]   Fax:  [phone]  Physical Therapy Treatment  Patient Details  Name: Keith Montoya MRN: [id number] Date of Birth: 04-09-1976 Referring Provider (PT): Laroy Apple, MD   Encounter Date: 03/14/2021   PT End of Session - 03/14/21 2013    Visit Number 22    Number of Visits 25    Date for PT Re-Evaluation 03/17/21    Authorization Type UHC    PT Start Time 1600    PT Stop Time 1645    PT Time Calculation (min) 45 min    Activity Tolerance Patient tolerated treatment well    Behavior During Therapy [facility] for tasks assessed/performed           History reviewed. No pertinent past medical history.  History reviewed. No pertinent surgical history.  There were no vitals filed for this visit.   Subjective Assessment - 03/14/21 1604    Subjective Saturday had pain in Rt upper trap and bad feelings into Lt lateral thigh. Put kitchen back together and worked on a laptop on Friday night. Still have a knot in Rt upper trap today but better than it was.    Currently in Pain? Yes    Pain Score --   just annoying   Pain Orientation Right    Pain Descriptors / Indicators Dull                             [facility] Adult PT Treatment/Exercise - 03/14/21 0001      Shoulder Exercises: Supine   Other Supine Exercises Rt resisted IR with inhale/ Lt fwd punch with exhale 2lb      Shoulder Exercises: Standing   External Rotation Limitations ER/IR at 90 abd red tband    Other Standing Exercises PRI: standing unsupported Lt lift with Lt trunk rotation      Manual Therapy   Manual therapy comments skilled palpation and monitoring during TPDN    Soft tissue mobilization Rt upper trap & levator            Trigger Point Dry Needling - 03/14/21 0001    Dry Needling Comments Rt upper trap                  PT Short Term Goals - 02/03/21  0900      PT SHORT TERM GOAL #1   Title able to demonstrate and report he can maintain good posture at his desk when at work at least 35% of the time    Baseline Pt reports changing desk ergonomics to minimize lat overactivity    Time 4    Period Weeks    Status Partially Met    Target Date 01/10/21      PT SHORT TERM GOAL #2   Title Pt will report 25% less pain in neck and shoulder and in back when standing for long periods of time    Baseline Pt with less "pain" and more "tightness" in R posterior shoulder    Time 4    Period Weeks    Status Partially Met    Target Date 01/10/21             PT Long Term Goals - 02/03/21 0901      PT LONG TERM GOAL #1   Title pt will be able to return to playing tennis due to imrpoved scpular and  shoulder stability    Baseline posterior and anterior oblique motor control not consistent 3/23    Time 8    Period Weeks    Status On-going    Target Date 03/17/21      PT LONG TERM GOAL #2   Title pt will report 70% less pain when standing so he can attend events for his kids    Baseline attempted standing for family event    Time 8    Period Weeks    Status On-going    Target Date 03/17/21      PT LONG TERM GOAL #3   Title Pt will be ind with advanced HEP for pain management and strength routine to maintain his activity level    Baseline Consistent with HEP 3/23    Time 8    Period Weeks    Status Partially Met    Target Date 03/17/21      PT LONG TERM GOAL #4   Title Pt will report he can work normal work day without feeling any neck or shoulder discomfort    Time 12    Period Weeks    Status On-going    Target Date 03/17/21                 Plan - 03/14/21 2013    Clinical Impression Statement Trigger point in Rt upper trap decreased with DN today. Discussed possible visit to orthodontist for TMJ pain. POC end date is nearing and will determine further treatment plan.    PT Treatment/Interventions ADLs/Self Care Home  Management;Biofeedback;Cryotherapy;Electrical Stimulation;Iontophoresis 49m/ml Dexamethasone;Moist Heat;Traction;Ultrasound;Therapeutic activities;Neuromuscular re-education;Therapeutic exercise;Patient/family education;Manual techniques;Passive range of motion;Dry needling;Taping    PT Next Visit Plan ERO    PT Home Exercise Plan For R shoulder: Low trap setting in child's pose, modified push-up plus, RTC strengthening. For L ITB/core: Prone knee bent hip extensions, palloff press, eccentric staggered sit<>stand, standing shoulder extensions, PRI St unsupported Rt lift with Rt trunk rot. IR/ER at 90 abd orange tband, prone W, Y; PRI lat hang    Consulted and Agree with Plan of Care Patient           Patient will benefit from skilled therapeutic intervention in order to improve the following deficits and impairments:  Increased muscle spasms,Decreased strength,Postural dysfunction,Impaired UE functional use,Decreased range of motion,Pain  Visit Diagnosis: Cervicalgia  Chronic right shoulder pain  Chronic left-sided low back pain, unspecified whether sciatica present     Problem List There are no problems to display for this patient.   Keith Montoya C. Keith Montoya PT, DPT 03/14/21 8:33 PM   [facility] [address] StreetGO[place] [address]Phone: [phone]  Fax:  [phone] Name: Keith FotheringhamMRN: [id number] of Birth: [birth year]

## 2021-03-16 ENCOUNTER — Ambulatory Visit (HOSPITAL_BASED_OUTPATIENT_CLINIC_OR_DEPARTMENT_OTHER): Payer: PRIVATE HEALTH INSURANCE | Admitting: Physical Therapy

## 2021-03-16 ENCOUNTER — Other Ambulatory Visit: Payer: Self-pay

## 2021-03-16 ENCOUNTER — Encounter (HOSPITAL_BASED_OUTPATIENT_CLINIC_OR_DEPARTMENT_OTHER): Payer: Self-pay | Admitting: Physical Therapy

## 2021-03-16 DIAGNOSIS — M542 Cervicalgia: Secondary | ICD-10-CM | POA: Diagnosis not present

## 2021-03-16 DIAGNOSIS — M25511 Pain in right shoulder: Secondary | ICD-10-CM

## 2021-03-16 DIAGNOSIS — G8929 Other chronic pain: Secondary | ICD-10-CM

## 2021-03-16 DIAGNOSIS — M545 Low back pain, unspecified: Secondary | ICD-10-CM

## 2021-03-16 NOTE — Therapy (Signed)
[facility] [address] [place], [place], [address] Phone: [phone]   Fax:  [phone]  Physical Therapy Treatment  Patient Details  Name: Keith Montoya MRN: [id number] Date of Birth: 1976/02/05 Referring Provider (PT): Keith Apple, MD   Encounter Date: 03/16/2021   PT End of Session - 03/16/21 1609    Visit Number 23    Number of Visits 39    Date for PT Re-Evaluation 05/12/21    Authorization Type UHC    PT Start Time 3151    PT Stop Time 1646    PT Time Calculation (min) 41 min    Activity Tolerance Patient tolerated treatment well    Behavior During Therapy [facility] for tasks assessed/performed           History reviewed. No pertinent past medical history.  History reviewed. No pertinent surgical history.  There were no vitals filed for this visit.       [facility] PT Assessment - 03/16/21 0001      Assessment   Medical Diagnosis rt shoulder pain w/ supraspinatus tendinosis, cervicalagia, and left lumbar spondylolysis    Referring Provider (PT) Keith Apple, MD      Prior Function   Vocation Requirements computer work, travel    Leisure tennis      Cognition   Overall Cognitive Status Within Functional Limits for tasks assessed      Observation/Other Assessments   Focus on Therapeutic Outcomes (FOTO)  not in system      Sensation   Additional Comments not right now in 4-5th digits but will see when reintro tennis      Posture/Postural Control   Posture Comments no pelvic obliquity, mild drop of Rt GHJ      AROM   Cervical - Right Side Bend 44    Cervical - Left Side Bend 40      PROM   Overall PROM Comments overall hypermobile through GHJ      Strength   Overall Strength Comments gross 5/5      Spurling's   Findings Negative                         [facility] Adult PT Treatment/Exercise - 03/16/21 0001      Lumbar Exercises: Prone   Other Prone Lumbar Exercises elbow/toes  plank + rocking    Other Prone Lumbar Exercises triceps push ups on knees                  PT Education - 03/16/21 1922    Education Details goals, POC    Person(s) Educated Patient    Methods Explanation;Handout    Comprehension Verbalized understanding;Need further instruction            PT Short Term Goals - 02/03/21 0900      PT SHORT TERM GOAL #1   Title able to demonstrate and report he can maintain good posture at his desk when at work at least 35% of the time    Baseline Pt reports changing desk ergonomics to minimize lat overactivity    Time 4    Period Weeks    Status Partially Met    Target Date 01/10/21      PT SHORT TERM GOAL #2   Title Pt will report 25% less pain in neck and shoulder and in back when standing for long periods of time    Baseline Pt with less "pain" and more "tightness" in  R posterior shoulder    Time 4    Period Weeks    Status Partially Met    Target Date 01/10/21             PT Long Term Goals - 03/16/21 1613      PT LONG TERM GOAL #1   Title pt will be able to return to playing tennis due to imrpoved scpular and shoulder stability    Baseline Just playing pickel ball- little to no overhead. tends to do single arm backhand and would like to learn 2-hand to decr hyperext.    Status On-going      PT LONG TERM GOAL #2   Title pt will report 70% less pain when standing so he can attend events for his kids    Baseline havent really been able to test, learning to stay more on heels in standing in short term events.      PT LONG TERM GOAL #3   Title Pt will be ind with advanced HEP for pain management and strength routine to maintain his activity level    Baseline requires progression    Status On-going      PT LONG TERM GOAL #4   Title Pt will report he can work normal work day without feeling any neck or shoulder discomfort    Baseline I don't usually notice discomfort until afternoons, worse on days when I am at a computer  longer                 Plan - 03/16/21 1632    Clinical Impression Statement Pt is making good progress toward goals and is able to complete exercises at home. was feeling great other than Lt thigh pain with fatigue. tenderness to infraspinatus tendon due to impingement from hypermobility. will consider use of ionto if point tenderness continues. With progress in stability and awareness being made, pt will benefit from extension of POC to continue progress toward LTGs and begin returning to sport.    PT Treatment/Interventions ADLs/Self Care Home Management;Biofeedback;Cryotherapy;Electrical Stimulation;Iontophoresis 56m/ml Dexamethasone;Moist Heat;Traction;Ultrasound;Therapeutic activities;Neuromuscular re-education;Therapeutic exercise;Patient/family education;Manual techniques;Passive range of motion;Dry needling;Taping    PT Next Visit Plan continue stability exercises, pectoralis eccentric- tennis swing    PT Home Exercise Plan PRI St unsupported Rt lift with Rt trunk rot. IR/ER at 90 abd orange tband, prone W, Y; PRI lat hang    Consulted and Agree with Plan of Care Patient           Patient will benefit from skilled therapeutic intervention in order to improve the following deficits and impairments:  Increased muscle spasms,Decreased strength,Postural dysfunction,Impaired UE functional use,Decreased range of motion,Pain  Visit Diagnosis: Cervicalgia - Plan: PT plan of care cert/re-cert  Chronic right shoulder pain - Plan: PT plan of care cert/re-cert  Chronic left-sided low back pain, unspecified whether sciatica present - Plan: PT plan of care cert/re-cert     Problem List There are no problems to display for this patient.   Keith Montoya PT, DPT 03/16/21 8:07 PM   [facility] [address] [address]Phone: [phone]  Fax:  [phone] Name: Keith MctigueMRN: [id number] of  Birth: [birth year]

## 2021-03-21 ENCOUNTER — Ambulatory Visit (HOSPITAL_BASED_OUTPATIENT_CLINIC_OR_DEPARTMENT_OTHER): Payer: PRIVATE HEALTH INSURANCE | Admitting: Physical Therapy

## 2021-03-21 ENCOUNTER — Other Ambulatory Visit: Payer: Self-pay

## 2021-03-21 ENCOUNTER — Encounter (HOSPITAL_BASED_OUTPATIENT_CLINIC_OR_DEPARTMENT_OTHER): Payer: Self-pay | Admitting: Physical Therapy

## 2021-03-21 DIAGNOSIS — M542 Cervicalgia: Secondary | ICD-10-CM | POA: Diagnosis not present

## 2021-03-21 DIAGNOSIS — M545 Low back pain, unspecified: Secondary | ICD-10-CM

## 2021-03-21 DIAGNOSIS — G8929 Other chronic pain: Secondary | ICD-10-CM

## 2021-03-21 NOTE — Therapy (Signed)
[facility] [address] [place], [place], [address] Phone: [phone]   Fax:  [phone]  Physical Therapy Treatment  Patient Details  Name: Keith Montoya MRN: [id number] Date of Birth: 12/22/1975 Referring Provider (PT): Laroy Apple, MD   Encounter Date: 03/21/2021   PT End of Session - 03/21/21 1604    Visit Number 24    Number of Visits 39    Date for PT Re-Evaluation 05/12/21    Authorization Type UHC    PT Start Time 1602    PT Stop Time 1645    PT Time Calculation (min) 43 min    Activity Tolerance Patient tolerated treatment well    Behavior During Therapy [facility] for tasks assessed/performed           History reviewed. No pertinent past medical history.  History reviewed. No pertinent surgical history.  There were no vitals filed for this visit.   Subjective Assessment - 03/21/21 1604    Subjective Been in a car a lot and just got back about an hour ago. A little tight in Rt upper trap. Had to incline push ups due to discomfort in wrist and elbow.    Currently in Pain? Yes    Pain Location Shoulder    Pain Orientation Right    Pain Descriptors / Indicators Tightness    Aggravating Factors  driving    Pain Relieving Factors DN                             [facility] Adult PT Treatment/Exercise - 03/21/21 0001      Lumbar Exercises: Aerobic   Stationary Bike 5 min      Lumbar Exercises: Standing   Other Standing Lumbar Exercises tband resisted each direction of tennis serve    Other Standing Lumbar Exercises Y- pressing ball into wall; SLS in mirror for level pelvis      Lumbar Exercises: Prone   Other Prone Lumbar Exercises plank roll outs on blue physioball      Shoulder Exercises: Supine   Other Supine Exercises flexion with blue physioball to diag reach                    PT Short Term Goals - 02/03/21 0900      PT SHORT TERM GOAL #1   Title able to demonstrate and  report he can maintain good posture at his desk when at work at least 35% of the time    Baseline Pt reports changing desk ergonomics to minimize lat overactivity    Time 4    Period Weeks    Status Partially Met    Target Date 01/10/21      PT SHORT TERM GOAL #2   Title Pt will report 25% less pain in neck and shoulder and in back when standing for long periods of time    Baseline Pt with less "pain" and more "tightness" in R posterior shoulder    Time 4    Period Weeks    Status Partially Met    Target Date 01/10/21             PT Long Term Goals - 03/16/21 1613      PT LONG TERM GOAL #1   Title pt will be able to return to playing tennis due to imrpoved scpular and shoulder stability    Baseline Just playing pickel ball- little to no overhead. tends  to do single arm backhand and would like to learn 2-hand to decr hyperext.    Status On-going      PT LONG TERM GOAL #2   Title pt will report 70% less pain when standing so he can attend events for his kids    Baseline havent really been able to test, learning to stay more on heels in standing in short term events.      PT LONG TERM GOAL #3   Title Pt will be ind with advanced HEP for pain management and strength routine to maintain his activity level    Baseline requires progression    Status On-going      PT LONG TERM GOAL #4   Title Pt will report he can work normal work day without feeling any neck or shoulder discomfort    Baseline I don't usually notice discomfort until afternoons, worse on days when I am at a computer longer                 Plan - 03/21/21 1644    Clinical Impression Statement Significant varus when in Lt SLS. Suspected stretching of lateral teninous and ligamentous structures from h/o pitching, tennis and golf.  Difficulty engaging abductors over TFL for support noted.    PT Treatment/Interventions ADLs/Self Care Home Management;Biofeedback;Cryotherapy;Electrical Stimulation;Iontophoresis  50m/ml Dexamethasone;Moist Heat;Traction;Ultrasound;Therapeutic activities;Neuromuscular re-education;Therapeutic exercise;Patient/family education;Manual techniques;Passive range of motion;Dry needling;Taping    PT Next Visit Plan continue stability exercises, review SLS    PT Home Exercise Plan PRI St unsupported Rt lift with Rt trunk rot. PRI lat hang. [id number]    Consulted and Agree with Plan of Care Patient           Patient will benefit from skilled therapeutic intervention in order to improve the following deficits and impairments:  Increased muscle spasms,Decreased strength,Postural dysfunction,Impaired UE functional use,Decreased range of motion,Pain  Visit Diagnosis: Cervicalgia  Chronic right shoulder pain  Chronic left-sided low back pain, unspecified whether sciatica present     Problem List There are no problems to display for this patient.   Mairi Stagliano C. Osborne Serio PT, DPT 03/21/21 9:33 PM   [facility] [address] [address]Phone: [phone]  Fax:  [phone] Name: MGaje TennysonMRN: [id number] of Birth: 120-Dec-1977

## 2021-03-23 ENCOUNTER — Encounter (HOSPITAL_BASED_OUTPATIENT_CLINIC_OR_DEPARTMENT_OTHER): Payer: Self-pay | Admitting: Physical Therapy

## 2021-03-23 ENCOUNTER — Other Ambulatory Visit: Payer: Self-pay

## 2021-03-23 ENCOUNTER — Ambulatory Visit (HOSPITAL_BASED_OUTPATIENT_CLINIC_OR_DEPARTMENT_OTHER): Payer: PRIVATE HEALTH INSURANCE | Admitting: Physical Therapy

## 2021-03-23 DIAGNOSIS — M542 Cervicalgia: Secondary | ICD-10-CM | POA: Diagnosis not present

## 2021-03-23 DIAGNOSIS — M545 Low back pain, unspecified: Secondary | ICD-10-CM

## 2021-03-23 DIAGNOSIS — G8929 Other chronic pain: Secondary | ICD-10-CM

## 2021-03-23 NOTE — Therapy (Signed)
[facility] [address] [place], [place], [address] Phone: [phone]   Fax:  [phone]  Physical Therapy Treatment  Patient Details  Name: Keith Montoya MRN: [id number] Date of Birth: 09-23-1976 Referring Provider (PT): Laroy Apple, MD   Encounter Date: 03/23/2021   PT End of Session - 03/23/21 1600    Visit Number 25    Number of Visits 39    Date for PT Re-Evaluation 05/12/21    Authorization Type UHC    PT Start Time 1660    PT Stop Time 1639    PT Time Calculation (min) 41 min    Activity Tolerance Patient tolerated treatment well    Behavior During Therapy [facility] for tasks assessed/performed           History reviewed. No pertinent past medical history.  History reviewed. No pertinent surgical history.  There were no vitals filed for this visit.   Subjective Assessment - 03/23/21 1600    Subjective nothing is blaring at me today. tender spot on post shoulder is not too bad.                             [facility] Adult PT Treatment/Exercise - 03/23/21 0001      Lumbar Exercises: Seated   Other Seated Lumbar Exercises round back with rotations 3lb   ball behind lumbar spine     Shoulder Exercises: Prone   Other Prone Exercises prone Y      Shoulder Exercises: Standing   Other Standing Exercises D1 & D2 against wall 2lb    Other Standing Exercises trunk rotation as in golf and tennis 3lb            Trigger Point Dry Needling - 03/23/21 0001    Infraspinatus Response Twitch response elicited;Palpable increased muscle length   Rt                 PT Short Term Goals - 02/03/21 0900      PT SHORT TERM GOAL #1   Title able to demonstrate and report he can maintain good posture at his desk when at work at least 35% of the time    Baseline Pt reports changing desk ergonomics to minimize lat overactivity    Time 4    Period Weeks    Status Partially Met    Target Date  01/10/21      PT SHORT TERM GOAL #2   Title Pt will report 25% less pain in neck and shoulder and in back when standing for long periods of time    Baseline Pt with less "pain" and more "tightness" in R posterior shoulder    Time 4    Period Weeks    Status Partially Met    Target Date 01/10/21             PT Long Term Goals - 03/16/21 1613      PT LONG TERM GOAL #1   Title pt will be able to return to playing tennis due to imrpoved scpular and shoulder stability    Baseline Just playing pickel ball- little to no overhead. tends to do single arm backhand and would like to learn 2-hand to decr hyperext.    Status On-going      PT LONG TERM GOAL #2   Title pt will report 70% less pain when standing so he can attend events for his kids  Baseline havent really been able to test, learning to stay more on heels in standing in short term events.      PT LONG TERM GOAL #3   Title Pt will be ind with advanced HEP for pain management and strength routine to maintain his activity level    Baseline requires progression    Status On-going      PT LONG TERM GOAL #4   Title Pt will report he can work normal work day without feeling any neck or shoulder discomfort    Baseline I don't usually notice discomfort until afternoons, worse on days when I am at a computer longer                 Plan - 03/23/21 1639    Clinical Impression Statement progressed IYT  today with cues for staying in peripheral vision. notable lack of trunk rotation in swinging motions- able to decr ITB pull with increasing core twisting and will cont to work on this.    PT Treatment/Interventions ADLs/Self Care Home Management;Biofeedback;Cryotherapy;Electrical Stimulation;Iontophoresis 90m/ml Dexamethasone;Moist Heat;Traction;Ultrasound;Therapeutic activities;Neuromuscular re-education;Therapeutic exercise;Patient/family education;Manual techniques;Passive range of motion;Dry needling;Taping    PT Home Exercise  Plan PRI St unsupported Rt lift with Rt trunk rot. PRI lat hang. [id number]    Consulted and Agree with Plan of Care Patient           Patient will benefit from skilled therapeutic intervention in order to improve the following deficits and impairments:  Increased muscle spasms,Decreased strength,Postural dysfunction,Impaired UE functional use,Decreased range of motion,Pain  Visit Diagnosis: Cervicalgia  Chronic right shoulder pain  Chronic left-sided low back pain, unspecified whether sciatica present     Problem List There are no problems to display for this patient.   Arleta Ostrum C. Zully Frane PT, DPT 03/23/21 8:01 PM   [facility] [address] [address]Phone: [phone]  Fax:  [phone] Name: Keith CervoneMRN: [id number] of Birth: [birth year]

## 2021-03-28 ENCOUNTER — Ambulatory Visit (HOSPITAL_BASED_OUTPATIENT_CLINIC_OR_DEPARTMENT_OTHER): Payer: PRIVATE HEALTH INSURANCE | Admitting: Physical Therapy

## 2021-03-30 ENCOUNTER — Encounter (HOSPITAL_BASED_OUTPATIENT_CLINIC_OR_DEPARTMENT_OTHER): Payer: Self-pay | Admitting: Physical Therapy

## 2021-03-30 ENCOUNTER — Ambulatory Visit (HOSPITAL_BASED_OUTPATIENT_CLINIC_OR_DEPARTMENT_OTHER): Payer: PRIVATE HEALTH INSURANCE | Admitting: Physical Therapy

## 2021-03-30 ENCOUNTER — Other Ambulatory Visit: Payer: Self-pay

## 2021-03-30 DIAGNOSIS — M545 Low back pain, unspecified: Secondary | ICD-10-CM

## 2021-03-30 DIAGNOSIS — M542 Cervicalgia: Secondary | ICD-10-CM

## 2021-03-30 DIAGNOSIS — G8929 Other chronic pain: Secondary | ICD-10-CM

## 2021-03-30 NOTE — Therapy (Signed)
Keith Montoya, [place], [address] Phone: [phone]   Fax:  [phone]  Physical Therapy Treatment  Patient Details  Name: Keith Montoya MRN: [id number] Date of Birth: 08-Sep-1976 Referring Provider (PT): Laroy Apple, MD   Encounter Date: 03/30/2021   PT End of Session - 03/30/21 1610    Visit Number 26    Number of Visits 39    Date for PT Re-Evaluation 05/12/21    Authorization Type UHC    PT Start Time 1660    PT Stop Time 1649    PT Time Calculation (min) 44 min    Activity Tolerance Patient tolerated treatment well    Behavior During Therapy [facility] for tasks assessed/performed           History reviewed. No pertinent past medical history.  History reviewed. No pertinent surgical history.  There were no vitals filed for this visit.   Subjective Assessment - 03/30/21 1605    Subjective Tennis the first time on Thurs night about 45 min. Lt quad and jaw started getting a little tight. Felt ok Fri AM. Played pickel ball fri evening. Sat and Keith Montoya did a lot around the house (painting) and felt very tight by Sunday in all the usual places. Played pickel ball monday night- Lt quads were super tight, nothing through lateral hip. post Rt shoulder hurts and Lt jaw.    Currently in Pain? Yes    Pain Score 5     Pain Location --   general   Pain Descriptors / Indicators Tightness                             [facility] Adult PT Treatment/Exercise - 03/30/21 0001      Shoulder Exercises: Standing   Other Standing Exercises airex & bosu stance with upper body diagonals 3lb weight      Manual Therapy   Manual therapy comments skilled palpation and monitoring during TPDN    Soft tissue mobilization upper trap, periscap region on Rt    Kinesiotex Facilitate Muscle      Kinesiotix   Facilitate Muscle  deltoid support & Rt GHJ stabilization            Trigger Point Dry Needling -  03/30/21 0001    Muscles Treated Head and Neck Upper trapezius    Upper Trapezius Response Twitch reponse elicited;Palpable increased muscle length   Rt   Infraspinatus Response Twitch response elicited;Palpable increased muscle length   Rt                 PT Short Term Goals - 02/03/21 0900      PT SHORT TERM GOAL #1   Title able to demonstrate and report he can maintain good posture at his desk when at work at least 35% of the time    Baseline Pt reports changing desk ergonomics to minimize lat overactivity    Time 4    Period Weeks    Status Partially Met    Target Date 01/10/21      PT SHORT TERM GOAL #2   Title Pt will report 25% less pain in neck and shoulder and in back when standing for long periods of time    Baseline Pt with less "pain" and more "tightness" in R posterior shoulder    Time 4    Period Weeks    Status Partially Met    Target Date  01/10/21             PT Long Term Goals - 03/16/21 1613      PT LONG TERM GOAL #1   Title pt will be able to return to playing tennis due to imrpoved scpular and shoulder stability    Baseline Just playing pickel ball- little to no overhead. tends to do single arm backhand and would like to learn 2-hand to decr hyperext.    Status On-going      PT LONG TERM GOAL #2   Title pt will report 70% less pain when standing so he can attend events for his kids    Baseline havent really been able to test, learning to stay more on heels in standing in short term events.      PT LONG TERM GOAL #3   Title Pt will be ind with advanced HEP for pain management and strength routine to maintain his activity level    Baseline requires progression    Status On-going      PT LONG TERM GOAL #4   Title Pt will report he can work normal work day without feeling any neck or shoulder discomfort    Baseline I don't usually notice discomfort until afternoons, worse on days when I am at a computer longer                 Plan -  03/30/21 2016    Clinical Impression Statement DN to Rt shoulder region decreased concordant pain. Ktape applied to discourage hypermobility movements. Discussed that he is improving in that his tolerance to activities has increased before having significant pain. Did not have pain into Lt hip/ITB with activity.    PT Treatment/Interventions ADLs/Self Care Home Management;Biofeedback;Cryotherapy;Electrical Stimulation;Iontophoresis 25m/ml Dexamethasone;Moist Heat;Traction;Ultrasound;Therapeutic activities;Neuromuscular re-education;Therapeutic exercise;Patient/family education;Manual techniques;Passive range of motion;Dry needling;Taping    PT Next Visit Plan cont PRI    PT Home Exercise Plan PRI St unsupported Rt lift with Rt trunk rot. PRI lat hang. [id number]    Consulted and Agree with Plan of Care Patient           Patient will benefit from skilled therapeutic intervention in order to improve the following deficits and impairments:  Increased muscle spasms,Decreased strength,Postural dysfunction,Impaired UE functional use,Decreased range of motion,Pain  Visit Diagnosis: Cervicalgia  Chronic right shoulder pain  Chronic left-sided low back pain, unspecified whether sciatica present     Problem List There are no problems to display for this patient.  Keith Montoya C. Keith Montoya PT, DPT 03/30/21 8:30 PM   [facility] [address] [address]Phone: [phone]  Fax:  [phone] Name: MSolan VoslerMRN: [id number] of Birth: [birth year]

## 2021-04-01 ENCOUNTER — Other Ambulatory Visit: Payer: Self-pay

## 2021-04-01 ENCOUNTER — Ambulatory Visit (HOSPITAL_BASED_OUTPATIENT_CLINIC_OR_DEPARTMENT_OTHER): Payer: PRIVATE HEALTH INSURANCE | Admitting: Physical Therapy

## 2021-04-01 ENCOUNTER — Encounter (HOSPITAL_BASED_OUTPATIENT_CLINIC_OR_DEPARTMENT_OTHER): Payer: Self-pay | Admitting: Physical Therapy

## 2021-04-01 DIAGNOSIS — M545 Low back pain, unspecified: Secondary | ICD-10-CM

## 2021-04-01 DIAGNOSIS — M542 Cervicalgia: Secondary | ICD-10-CM | POA: Diagnosis not present

## 2021-04-01 DIAGNOSIS — G8929 Other chronic pain: Secondary | ICD-10-CM

## 2021-04-01 NOTE — Therapy (Signed)
[facility] [address] [place], [place], [address] Phone: [phone]   Fax:  [phone]  Physical Therapy Treatment  Patient Details  Name: Keith Montoya MRN: [id number] Date of Birth: July 23, 1976 Referring Provider (PT): Laroy Apple, MD   Encounter Date: 04/01/2021   PT End of Session - 04/01/21 1606    Visit Number 27    Number of Visits 39    Date for PT Re-Evaluation 05/12/21    Authorization Type UHC    PT Start Time 2671    PT Stop Time 1647    PT Time Calculation (min) 42 min    Activity Tolerance Patient tolerated treatment well    Behavior During Therapy [facility] for tasks assessed/performed           History reviewed. No pertinent past medical history.  History reviewed. No pertinent surgical history.  There were no vitals filed for this visit.   Subjective Assessment - 04/01/21 1605    Subjective A bit of tenderness into post belly of deltoid. Restriction of k-tape is odd.    Currently in Pain? No/denies                             [facility] Adult PT Treatment/Exercise - 04/01/21 0001      Lumbar Exercises: Standing   Other Standing Lumbar Exercises bosu ball squat holds    Other Standing Lumbar Exercises A/P rocking on bosu      Lumbar Exercises: Fayne Mediate elbows/toes 3x30s      Shoulder Exercises: Standing   External Rotation Right;20 reps;Theraband    Theraband Level (Shoulder External Rotation) Level 3 (Green)    Internal Rotation Right;20 reps;Theraband    Theraband Level (Shoulder Internal Rotation) Level 3 (Green)    Other Standing Exercises plyo push ups wide & narrow with stable periscap      [employer]   Facilitate Muscle  deltoid support & Rt GHJ stabilization                    PT Short Term Goals - 02/03/21 0900      PT SHORT TERM GOAL #1   Title able to demonstrate and report he can maintain good posture at his desk when at work at least 35%  of the time    Baseline Pt reports changing desk ergonomics to minimize lat overactivity    Time 4    Period Weeks    Status Partially Met    Target Date 01/10/21      PT SHORT TERM GOAL #2   Title Pt will report 25% less pain in neck and shoulder and in back when standing for long periods of time    Baseline Pt with less "pain" and more "tightness" in R posterior shoulder    Time 4    Period Weeks    Status Partially Met    Target Date 01/10/21             PT Long Term Goals - 03/16/21 1613      PT LONG TERM GOAL #1   Title pt will be able to return to playing tennis due to imrpoved scpular and shoulder stability    Baseline Just playing pickel ball- little to no overhead. tends to do single arm backhand and would like to learn 2-hand to decr hyperext.    Status On-going      PT LONG TERM GOAL #2  Title pt will report 70% less pain when standing so he can attend events for his kids    Baseline havent really been able to test, learning to stay more on heels in standing in short term events.      PT LONG TERM GOAL #3   Title Pt will be ind with advanced HEP for pain management and strength routine to maintain his activity level    Baseline requires progression    Status On-going      PT LONG TERM GOAL #4   Title Pt will report he can work normal work day without feeling any neck or shoulder discomfort    Baseline I don't usually notice discomfort until afternoons, worse on days when I am at a computer longer                 Plan - 04/01/21 1958    Clinical Impression Statement Replaced ktape today and will try playing tennis with it on. cont to require cues for form in exercises but demo improved control of shoulder. BOSU was difficult to keep stable due to unequal reliance on LEs. attempted use of posterior biomechanical chain-imited by trigger point at L2 level which was decr with TPDN.    PT Treatment/Interventions ADLs/Self Care Home  Management;Biofeedback;Cryotherapy;Electrical Stimulation;Iontophoresis 74m/ml Dexamethasone;Moist Heat;Traction;Ultrasound;Therapeutic activities;Neuromuscular re-education;Therapeutic exercise;Patient/family education;Manual techniques;Passive range of motion;Dry needling;Taping    PT Next Visit Plan how did tennis go with tape? post chain activation    PT Home Exercise Plan PRI St unsupported Rt lift with Rt trunk rot. PRI lat hang. [id number]    Consulted and Agree with Plan of Care Patient           Patient will benefit from skilled therapeutic intervention in order to improve the following deficits and impairments:  Increased muscle spasms,Decreased strength,Postural dysfunction,Impaired UE functional use,Decreased range of motion,Pain  Visit Diagnosis: Chronic right shoulder pain  Chronic left-sided low back pain, unspecified whether sciatica present  Cervicalgia     Problem List There are no problems to display for this patient.   Keith Montoya PT, DPT 04/01/21 8:05 PM   [facility] [address] [address]Phone: [phone]  Fax:  [phone] Name: Keith SernaMRN: [id number] of Birth: [birth year]

## 2021-04-08 ENCOUNTER — Other Ambulatory Visit: Payer: Self-pay

## 2021-04-08 ENCOUNTER — Encounter (HOSPITAL_BASED_OUTPATIENT_CLINIC_OR_DEPARTMENT_OTHER): Payer: Self-pay | Admitting: Physical Therapy

## 2021-04-08 ENCOUNTER — Ambulatory Visit (HOSPITAL_BASED_OUTPATIENT_CLINIC_OR_DEPARTMENT_OTHER): Payer: PRIVATE HEALTH INSURANCE | Admitting: Physical Therapy

## 2021-04-08 DIAGNOSIS — G8929 Other chronic pain: Secondary | ICD-10-CM

## 2021-04-08 DIAGNOSIS — M25511 Pain in right shoulder: Secondary | ICD-10-CM

## 2021-04-08 DIAGNOSIS — M542 Cervicalgia: Secondary | ICD-10-CM

## 2021-04-08 DIAGNOSIS — M545 Low back pain, unspecified: Secondary | ICD-10-CM

## 2021-04-08 NOTE — Therapy (Signed)
[facility] [address] [place], [place], [address] Phone: [phone]   Fax:  [phone]  Physical Therapy Treatment  Patient Details  Name: Keith Montoya MRN: [id number] Date of Birth: [birth year] Referring Provider (PT): Laroy Apple, MD   Encounter Date: 04/08/2021   PT End of Session - 04/08/21 0759    Visit Number 28    Number of Visits 39    Date for PT Re-Evaluation 05/12/21    Authorization Type UHC    PT Start Time 2542    PT Stop Time 0841    PT Time Calculation (min) 46 min    Activity Tolerance Patient tolerated treatment well    Behavior During Therapy [facility] for tasks assessed/performed           History reviewed. No pertinent past medical history.  History reviewed. No pertinent surgical history.  There were no vitals filed for this visit.   Subjective Assessment - 04/08/21 0755    Subjective Lt finger got stung and is swollen. Qped thoracic rotation creates fatigue in Rt shoulder. Feels a little limatation from a version of soreness in Rt shoulder. no issue with LLE.    Currently in Pain? Yes    Pain Score 4     Pain Location Shoulder    Pain Orientation Right    Pain Descriptors / Indicators Sore    Aggravating Factors  fatiguing shoulder    Pain Relieving Factors exercises.              [facility] PT Assessment - 04/08/21 0001      AROM   Cervical - Right Side Bend 50    Cervical - Left Side Bend 48                         [facility] Adult PT Treatment/Exercise - 04/08/21 0001      Lumbar Exercises: Aerobic   Stationary Bike 5 min      Lumbar Exercises: Quadruped   Plank knees/elbows with protraction      Shoulder Exercises: Supine   Other Supine Exercises body blade supine @ 90 abd/add & flx/ext    Other Supine Exercises full range GHJ flx with cues for scapular motion      Shoulder Exercises: Prone   Retraction Limitations in W position    Other Prone Exercises qped  protract/retract    Other Prone Exercises qped ball on table circles with protraction      Shoulder Exercises: Sidelying   Other Sidelying Exercises resisted scap retraction      Shoulder Exercises: Standing   Other Standing Exercises quick review of posture for Y at wall                    PT Short Term Goals - 02/03/21 0900      PT SHORT TERM GOAL #1   Title able to demonstrate and report he can maintain good posture at his desk when at work at least 35% of the time    Baseline Pt reports changing desk ergonomics to minimize lat overactivity    Time 4    Period Weeks    Status Partially Met    Target Date 01/10/21      PT SHORT TERM GOAL #2   Title Pt will report 25% less pain in neck and shoulder and in back when standing for long periods of time    Baseline Pt with less "pain" and more "  tightness" in R posterior shoulder    Time 4    Period Weeks    Status Partially Met    Target Date 01/10/21             PT Long Term Goals - 03/16/21 1613      PT LONG TERM GOAL #1   Title pt will be able to return to playing tennis due to imrpoved scpular and shoulder stability    Baseline Just playing pickel ball- little to no overhead. tends to do single arm backhand and would like to learn 2-hand to decr hyperext.    Status On-going      PT LONG TERM GOAL #2   Title pt will report 70% less pain when standing so he can attend events for his kids    Baseline havent really been able to test, learning to stay more on heels in standing in short term events.      PT LONG TERM GOAL #3   Title Pt will be ind with advanced HEP for pain management and strength routine to maintain his activity level    Baseline requires progression    Status On-going      PT LONG TERM GOAL #4   Title Pt will report he can work normal work day without feeling any neck or shoulder discomfort    Baseline I don't usually notice discomfort until afternoons, worse on days when I am at a computer  longer                 Plan - 04/08/21 8366    Clinical Impression Statement Improving stability in lumbopelvic region without LE pain. cont to have significant instability in Rt periscapular region leading to impingement with GHJ motion. Focusing on retraining for scapular movement to decrease excessive GHJ mobility.    PT Treatment/Interventions ADLs/Self Care Home Management;Biofeedback;Cryotherapy;Electrical Stimulation;Iontophoresis 10m/ml Dexamethasone;Moist Heat;Traction;Ultrasound;Therapeutic activities;Neuromuscular re-education;Therapeutic exercise;Patient/family education;Manual techniques;Passive range of motion;Dry needling;Taping    PT Next Visit Plan cont resisted scapular retraction    PT Home Exercise Plan PRI St unsupported Rt lift with Rt trunk rot. PRI lat hang. [id number]    Consulted and Agree with Plan of Care Patient           Patient will benefit from skilled therapeutic intervention in order to improve the following deficits and impairments:  Increased muscle spasms,Decreased strength,Postural dysfunction,Impaired UE functional use,Decreased range of motion,Pain  Visit Diagnosis: Chronic right shoulder pain  Chronic left-sided low back pain, unspecified whether sciatica present  Cervicalgia     Problem List There are no problems to display for this patient.   Nora Sabey C. Danuta Huseman PT, DPT 04/08/21 9:36 AM   [facility] [address] [address]Phone: [phone]  Fax:  [phone] Name: Keith ParfaitMRN: [id number] of Birth: [birth year]

## 2021-04-15 ENCOUNTER — Encounter (HOSPITAL_BASED_OUTPATIENT_CLINIC_OR_DEPARTMENT_OTHER): Payer: Self-pay | Admitting: Physical Therapy

## 2021-04-15 ENCOUNTER — Other Ambulatory Visit: Payer: Self-pay

## 2021-04-15 ENCOUNTER — Ambulatory Visit (HOSPITAL_BASED_OUTPATIENT_CLINIC_OR_DEPARTMENT_OTHER): Payer: PRIVATE HEALTH INSURANCE | Attending: Physical Medicine and Rehabilitation | Admitting: Physical Therapy

## 2021-04-15 DIAGNOSIS — M542 Cervicalgia: Secondary | ICD-10-CM | POA: Insufficient documentation

## 2021-04-15 DIAGNOSIS — G8929 Other chronic pain: Secondary | ICD-10-CM | POA: Insufficient documentation

## 2021-04-15 DIAGNOSIS — M545 Low back pain, unspecified: Secondary | ICD-10-CM | POA: Diagnosis present

## 2021-04-15 DIAGNOSIS — M25511 Pain in right shoulder: Secondary | ICD-10-CM | POA: Insufficient documentation

## 2021-04-15 NOTE — Therapy (Signed)
[facility] [address] [place], [place], [address] Phone: [phone]   Fax:  [phone]  Physical Therapy Treatment  Patient Details  Name: Keith Montoya MRN: [id number] Date of Birth: May 21, 1976 Referring Provider (PT): Laroy Apple, MD   Encounter Date: 04/15/2021   PT End of Session - 04/15/21 1602    Visit Number 29    Number of Visits 39    Date for PT Re-Evaluation 05/12/21    Authorization Type UHC    PT Start Time 1600    PT Stop Time 9767    PT Time Calculation (min) 46 min    Activity Tolerance Patient tolerated treatment well    Behavior During Therapy [facility] for tasks assessed/performed           History reviewed. No pertinent past medical history.  History reviewed. No pertinent surgical history.  There were no vitals filed for this visit.   Subjective Assessment - 04/15/21 1603    Subjective On my feet a lot last week running the tournament. Was able to do exercises 2/3 days. Low back still gets tight in plank exercise. the wall is a helpful reminder of shoulder movements.                             [facility] Adult PT Treatment/Exercise - 04/15/21 0001      Therapeutic Activites    Therapeutic Activities ADL's    ADL's pickle ball      Lumbar Exercises: Aerobic   Stationary Bike 5 min      Lumbar Exercises: Seated   Other Seated Lumbar Exercises C-sit hold with hands under thighs      Lumbar Exercises: Supine   Other Supine Lumbar Exercises Lt thomas stretch with Rt D2 2lb    Other Supine Lumbar Exercises reverse crunches ball bw knees      Manual Therapy   Manual therapy comments skilled palpation and monitoring during TPDN    Soft tissue mobilization Rt infraspinatus            Trigger Point Dry Needling - 04/15/21 0001    Infraspinatus Response Twitch response elicited;Palpable increased muscle length   Rt                 PT Short Term Goals - 02/03/21  0900      PT SHORT TERM GOAL #1   Title able to demonstrate and report he can maintain good posture at his desk when at work at least 35% of the time    Baseline Pt reports changing desk ergonomics to minimize lat overactivity    Time 4    Period Weeks    Status Partially Met    Target Date 01/10/21      PT SHORT TERM GOAL #2   Title Pt will report 25% less pain in neck and shoulder and in back when standing for long periods of time    Baseline Pt with less "pain" and more "tightness" in R posterior shoulder    Time 4    Period Weeks    Status Partially Met    Target Date 01/10/21             PT Long Term Goals - 03/16/21 1613      PT LONG TERM GOAL #1   Title pt will be able to return to playing tennis due to imrpoved scpular and shoulder stability    Baseline Just  playing pickel ball- little to no overhead. tends to do single arm backhand and would like to learn 2-hand to decr hyperext.    Status On-going      PT LONG TERM GOAL #2   Title pt will report 70% less pain when standing so he can attend events for his kids    Baseline havent really been able to test, learning to stay more on heels in standing in short term events.      PT LONG TERM GOAL #3   Title Pt will be ind with advanced HEP for pain management and strength routine to maintain his activity level    Baseline requires progression    Status On-going      PT LONG TERM GOAL #4   Title Pt will report he can work normal work day without feeling any neck or shoulder discomfort    Baseline I don't usually notice discomfort until afternoons, worse on days when I am at a computer longer                 Plan - 04/15/21 1655    Clinical Impression Statement Utilized pickle ball net to observe motion in play- noted that he uses GHJ motion without rotation of trunk on pelvis. Cues to open chest in back hand and keep hand in peripheral vision & avoid shrug in forehand. Attempted half kneel with trunk rotation  but increased Lt leg pain. Lengthened QL and quads in mod thomas stretch with UE D2. Notable core weakness- will focus on improving this and progress into trunk rotation challenges as appropriate. Going to the beach next week and will work on [employer] for that time.    PT Treatment/Interventions ADLs/Self Care Home Management;Biofeedback;Cryotherapy;Electrical Stimulation;Iontophoresis 71m/ml Dexamethasone;Moist Heat;Traction;Ultrasound;Therapeutic activities;Neuromuscular re-education;Therapeutic exercise;Patient/family education;Manual techniques;Passive range of motion;Dry needling;Taping    PT Next Visit Plan core strength- revisit rotation challenges    PT Home Exercise Plan PRI St unsupported Rt lift with Rt trunk rot. PRI lat hang. [id number]    Consulted and Agree with Plan of Care Patient           Patient will benefit from skilled therapeutic intervention in order to improve the following deficits and impairments:  Increased muscle spasms,Decreased strength,Postural dysfunction,Impaired UE functional use,Decreased range of motion,Pain  Visit Diagnosis: Chronic right shoulder pain  Chronic left-sided low back pain, unspecified whether sciatica present  Cervicalgia     Problem List There are no problems to display for this patient.   Keith Montoya PT, DPT 04/15/21 5:01 PM   [facility] [address] [place] [address]Phone: [phone]  Fax:  [phone] Name: Keith LeveneMRN: [id number] of Birth: 109-Aug-1977

## 2021-04-25 ENCOUNTER — Other Ambulatory Visit: Payer: Self-pay

## 2021-04-25 ENCOUNTER — Ambulatory Visit (HOSPITAL_BASED_OUTPATIENT_CLINIC_OR_DEPARTMENT_OTHER): Payer: PRIVATE HEALTH INSURANCE | Admitting: Physical Therapy

## 2021-04-25 DIAGNOSIS — M25511 Pain in right shoulder: Secondary | ICD-10-CM

## 2021-04-25 DIAGNOSIS — M542 Cervicalgia: Secondary | ICD-10-CM

## 2021-04-25 DIAGNOSIS — M545 Low back pain, unspecified: Secondary | ICD-10-CM

## 2021-04-25 NOTE — Therapy (Signed)
[facility] [address] [place], [place], [address] Phone: [phone]   Fax:  [phone]  Physical Therapy Treatment  Patient Details  Name: Keith Montoya MRN: [id number] Date of Birth: 1976/04/11 Referring Provider (PT): Keith Apple, MD   Encounter Date: 04/25/2021   PT End of Session - 04/25/21 1609     Visit Number 30    Number of Visits 39    Date for PT Re-Evaluation 05/12/21    Authorization Type UHC    PT Start Time 1600    PT Stop Time 1645    PT Time Calculation (min) 45 min    Activity Tolerance Patient tolerated treatment well    Behavior During Therapy [facility] for tasks assessed/performed             No past medical history on file.  No past surgical history on file.  There were no vitals filed for this visit.   Subjective Assessment - 04/25/21 1603     Subjective Pt states he has not been as good with doing his exercises. Pt states his shoulder is feeling alright.    Currently in Pain? Yes    Pain Score 2     Pain Location Shoulder    Pain Orientation Right                               [facility] Adult PT Treatment/Exercise - 04/25/21 0001       Lumbar Exercises: Standing   Other Standing Lumbar Exercises Tall kneeling: up and down diagonal chops green tband 3x10 in all directions      Lumbar Exercises: Seated   Other Seated Lumbar Exercises On pball: small diagonal chop 2# x10, alternating UE/LE flexion 2x10      Lumbar Exercises: Supine   Other Supine Lumbar Exercises V-sit x20 sec      Shoulder Exercises: Standing   Extension Strengthening;Both;20 reps;Theraband   standing on airex   Theraband Level (Shoulder Extension) Level 3 (Green)                      PT Short Term Goals - 02/03/21 0900       PT SHORT TERM GOAL #1   Title able to demonstrate and report he can maintain good posture at his desk when at work at least 35% of the time    Baseline  Pt reports changing desk ergonomics to minimize lat overactivity    Time 4    Period Weeks    Status Partially Met    Target Date 01/10/21      PT SHORT TERM GOAL #2   Title Pt will report 25% less pain in neck and shoulder and in back when standing for long periods of time    Baseline Pt with less "pain" and more "tightness" in R posterior shoulder    Time 4    Period Weeks    Status Partially Met    Target Date 01/10/21               PT Long Term Goals - 03/16/21 1613       PT LONG TERM GOAL #1   Title pt will be able to return to playing tennis due to imrpoved scpular and shoulder stability    Baseline Just playing pickel ball- little to no overhead. tends to do single arm backhand and would like to learn 2-hand to decr hyperext.  Status On-going      PT LONG TERM GOAL #2   Title pt will report 70% less pain when standing so he can attend events for his kids    Baseline havent really been able to test, learning to stay more on heels in standing in short term events.      PT LONG TERM GOAL #3   Title Pt will be ind with advanced HEP for pain management and strength routine to maintain his activity level    Baseline requires progression    Status On-going      PT LONG TERM GOAL #4   Title Pt will report he can work normal work day without feeling any neck or shoulder discomfort    Baseline I don't usually notice discomfort until afternoons, worse on days when I am at a computer longer                   Plan - 04/25/21 1611     Clinical Impression Statement Treatment focused on core/trunk rotation for improved function with tennis/pickle ball. Noted increased sensation in L thigh with decreased core stabilization. Attempted core stabilization sitting on pball with weight; however pt unable to coordinate core activation with 2 lb weight.    Personal Factors and Comorbidities Time since onset of injury/illness/exacerbation;Profession;Comorbidity 2     Comorbidities subacromial bone spur removal; ulnar nerve surgery Rt side    Examination-Activity Limitations Lift;Stand    Examination-Participation Restrictions Community Activity;Occupation    PT Treatment/Interventions ADLs/Self Care Home Management;Biofeedback;Cryotherapy;Electrical Stimulation;Iontophoresis 75m/ml Dexamethasone;Moist Heat;Traction;Ultrasound;Therapeutic activities;Neuromuscular re-education;Therapeutic exercise;Patient/family education;Manual techniques;Passive range of motion;Dry needling;Taping    PT Next Visit Plan core strength- revisit rotation challenges, progress tall kneeling exercises as indicated, work on diagonals/rotation with racquet    PT Home Exercise Plan PRI St unsupported Rt lift with Rt trunk rot. PRI lat hang. [id number]    Consulted and Agree with Plan of Care Patient             Patient will benefit from skilled therapeutic intervention in order to improve the following deficits and impairments:  Increased muscle spasms, Decreased strength, Postural dysfunction, Impaired UE functional use, Decreased range of motion, Pain  Visit Diagnosis: Chronic right shoulder pain  Chronic left-sided low back pain, unspecified whether sciatica present  Cervicalgia     Problem List There are no problems to display for this patient.   GMagnolia HospitalA[address]PT, DPT 04/25/2021, 4:53 PM  [facility] [place] [address]Phone: [phone]  Fax:  [phone] Name: MSayvion VigenMRN: [id number] of Birth: [birth year]

## 2021-04-27 ENCOUNTER — Other Ambulatory Visit: Payer: Self-pay

## 2021-04-27 ENCOUNTER — Ambulatory Visit (HOSPITAL_BASED_OUTPATIENT_CLINIC_OR_DEPARTMENT_OTHER): Payer: PRIVATE HEALTH INSURANCE | Admitting: Physical Therapy

## 2021-04-27 DIAGNOSIS — M545 Low back pain, unspecified: Secondary | ICD-10-CM

## 2021-04-27 DIAGNOSIS — G8929 Other chronic pain: Secondary | ICD-10-CM

## 2021-04-27 DIAGNOSIS — M25511 Pain in right shoulder: Secondary | ICD-10-CM | POA: Diagnosis not present

## 2021-04-27 DIAGNOSIS — M542 Cervicalgia: Secondary | ICD-10-CM

## 2021-04-27 NOTE — Therapy (Signed)
[facility] [address] [place], [place], [address] Phone: [phone]   Fax:  [phone]  Physical Therapy Treatment  Patient Details  Name: Keith Montoya MRN: [id number] Date of Birth: 1976/07/27 Referring Provider (PT): Laroy Apple, MD   Encounter Date: 04/27/2021   PT End of Session - 04/27/21 1636     Visit Number 31    Number of Visits 39    Date for PT Re-Evaluation 05/12/21    Authorization Type UHC    PT Start Time 1601    PT Stop Time 1640    PT Time Calculation (min) 39 min    Activity Tolerance Patient tolerated treatment well    Behavior During Therapy [facility] for tasks assessed/performed             No past medical history on file.  No past surgical history on file.  There were no vitals filed for this visit.   Subjective Assessment - 04/27/21 1604     Subjective Pt reports he is doing okay. Felt good after last session.    Limitations Standing;Other (comment)    Currently in Pain? Yes    Pain Score 2     Pain Location Shoulder    Pain Orientation Right                               [facility] Adult PT Treatment/Exercise - 04/27/21 0001       Lumbar Exercises: Stretches   Prone Mid Back Stretch 20 seconds    Other Lumbar Stretch Exercise half kneel hip psoas stretch x20 sec      Lumbar Exercises: Aerobic   Stationary Bike 5 min      Lumbar Exercises: Standing   Other Standing Lumbar Exercises Tall kneeling: up and down diagonal chops yellow tband x10 warm up; 2 hand and then one handed with cane (yellow tband on end) diagonals x10 each      Lumbar Exercises: Supine   Other Supine Lumbar Exercises V-sit x20 sec, then with rotation 2x10 for obliques    Other Supine Lumbar Exercises reverse crunches x5; with rotation for obliques 2x10                      PT Short Term Goals - 02/03/21 0900       PT SHORT TERM GOAL #1   Title able to demonstrate and  report he can maintain good posture at his desk when at work at least 35% of the time    Baseline Pt reports changing desk ergonomics to minimize lat overactivity    Time 4    Period Weeks    Status Partially Met    Target Date 01/10/21      PT SHORT TERM GOAL #2   Title Pt will report 25% less pain in neck and shoulder and in back when standing for long periods of time    Baseline Pt with less "pain" and more "tightness" in R posterior shoulder    Time 4    Period Weeks    Status Partially Met    Target Date 01/10/21               PT Long Term Goals - 03/16/21 1613       PT LONG TERM GOAL #1   Title pt will be able to return to playing tennis due to imrpoved scpular and shoulder stability  Baseline Just playing pickel ball- little to no overhead. tends to do single arm backhand and would like to learn 2-hand to decr hyperext.    Status On-going      PT LONG TERM GOAL #2   Title pt will report 70% less pain when standing so he can attend events for his kids    Baseline havent really been able to test, learning to stay more on heels in standing in short term events.      PT LONG TERM GOAL #3   Title Pt will be ind with advanced HEP for pain management and strength routine to maintain his activity level    Baseline requires progression    Status On-going      PT LONG TERM GOAL #4   Title Pt will report he can work normal work day without feeling any neck or shoulder discomfort    Baseline I don't usually notice discomfort until afternoons, worse on days when I am at a computer longer                   Plan - 04/27/21 1614     Clinical Impression Statement Continued to work on core/trunk rotation in tall kneeling. Worked on strengthening using resistance around end of cane as a pseudo racquet. Pt with improving trunk control with use of single UE.    Personal Factors and Comorbidities Time since onset of injury/illness/exacerbation;Profession;Comorbidity 2     Comorbidities subacromial bone spur removal; ulnar nerve surgery Rt side    Examination-Activity Limitations Lift;Stand    Examination-Participation Restrictions Community Activity;Occupation    PT Treatment/Interventions ADLs/Self Care Home Management;Biofeedback;Cryotherapy;Electrical Stimulation;Iontophoresis 50m/ml Dexamethasone;Moist Heat;Traction;Ultrasound;Therapeutic activities;Neuromuscular re-education;Therapeutic exercise;Patient/family education;Manual techniques;Passive range of motion;Dry needling;Taping    PT Next Visit Plan core strength- revisit rotation challenges, progress tall kneeling exercises as indicated, work on diagonals/rotation with racquet (single hand and double hand)    PT Home Exercise Plan PRI St unsupported Rt lift with Rt trunk rot. PRI lat hang. [id number]    Consulted and Agree with Plan of Care Patient             Patient will benefit from skilled therapeutic intervention in order to improve the following deficits and impairments:  Increased muscle spasms, Decreased strength, Postural dysfunction, Impaired UE functional use, Decreased range of motion, Pain  Visit Diagnosis: Chronic right shoulder pain  Chronic left-sided low back pain, unspecified whether sciatica present  Cervicalgia     Problem List There are no problems to display for this patient.   GSsm Health Depaul Health CenterA414 Amerige LanePT, DPT 04/27/2021, 4:46 PM  [facility] [address]Phone: [phone]  Fax:  [phone] Name: Keith WahlenMRN: [id number] of Birth: [birth year]

## 2021-05-02 ENCOUNTER — Ambulatory Visit (HOSPITAL_BASED_OUTPATIENT_CLINIC_OR_DEPARTMENT_OTHER): Payer: PRIVATE HEALTH INSURANCE | Admitting: Physical Therapy

## 2021-05-04 ENCOUNTER — Encounter (HOSPITAL_BASED_OUTPATIENT_CLINIC_OR_DEPARTMENT_OTHER): Payer: Self-pay | Admitting: Physical Therapy

## 2021-05-04 ENCOUNTER — Other Ambulatory Visit: Payer: Self-pay

## 2021-05-04 ENCOUNTER — Ambulatory Visit (HOSPITAL_BASED_OUTPATIENT_CLINIC_OR_DEPARTMENT_OTHER): Payer: PRIVATE HEALTH INSURANCE | Admitting: Physical Therapy

## 2021-05-04 DIAGNOSIS — M545 Low back pain, unspecified: Secondary | ICD-10-CM

## 2021-05-04 DIAGNOSIS — G8929 Other chronic pain: Secondary | ICD-10-CM

## 2021-05-04 DIAGNOSIS — M25511 Pain in right shoulder: Secondary | ICD-10-CM | POA: Diagnosis not present

## 2021-05-04 DIAGNOSIS — M542 Cervicalgia: Secondary | ICD-10-CM

## 2021-05-04 NOTE — Therapy (Signed)
[facility] [address] [place], [place], [address] Phone: [phone]   Fax:  [phone]  Physical Therapy Treatment  Patient Details  Name: Arie Powell MRN: [id number] Date of Birth: 1975-11-15 Referring Provider (PT): Laroy Apple, MD   Encounter Date: 05/04/2021   PT End of Session - 05/04/21 1558     Visit Number 32    Number of Visits 39    Date for PT Re-Evaluation 05/12/21    Authorization Type UHC    PT Start Time 1660    PT Stop Time 1636    PT Time Calculation (min) 40 min    Activity Tolerance Patient tolerated treatment well    Behavior During Therapy [facility] for tasks assessed/performed             History reviewed. No pertinent past medical history.  History reviewed. No pertinent surgical history.  There were no vitals filed for this visit.   Subjective Assessment - 05/04/21 1556     Subjective Played tennis on Saturday- Sunday had pain in bicept tendon on rt side.                [facility] PT Assessment - 05/04/21 0001       Sensation   Additional Comments Rt arm is a lot better but is working back into tennis, I feel like I can make a fist now      AROM   Cervical - Right Side Bend 50    Cervical - Left Side Bend 48    Cervical - Right Rotation 80    Cervical - Left Rotation 84                           [facility] Adult PT Treatment/Exercise - 05/04/21 0001       Shoulder Exercises: Standing   Other Standing Exercises High kneeling 1lb bar swings- tennis      Shoulder Exercises: ROM/Strengthening   Other ROM/Strengthening Exercises 5lb on pulley system concentric & eccentric tennis hit and serve    Other ROM/Strengthening Exercises Lat press on triceps machine & done in chair      [employer]   Inhibit Muscle  pec major    Facilitate Muscle  periscap retraction                      PT Short Term Goals - 02/03/21 0900       PT SHORT TERM GOAL #1    Title able to demonstrate and report he can maintain good posture at his desk when at work at least 35% of the time    Baseline Pt reports changing desk ergonomics to minimize lat overactivity    Time 4    Period Weeks    Status Partially Met    Target Date 01/10/21      PT SHORT TERM GOAL #2   Title Pt will report 25% less pain in neck and shoulder and in back when standing for long periods of time    Baseline Pt with less "pain" and more "tightness" in R posterior shoulder    Time 4    Period Weeks    Status Partially Met    Target Date 01/10/21               PT Long Term Goals - 03/16/21 1613       PT LONG TERM GOAL #1   Title pt will be  able to return to playing tennis due to imrpoved scpular and shoulder stability    Baseline Just playing pickel ball- little to no overhead. tends to do single arm backhand and would like to learn 2-hand to decr hyperext.    Status On-going      PT LONG TERM GOAL #2   Title pt will report 70% less pain when standing so he can attend events for his kids    Baseline havent really been able to test, learning to stay more on heels in standing in short term events.      PT LONG TERM GOAL #3   Title Pt will be ind with advanced HEP for pain management and strength routine to maintain his activity level    Baseline requires progression    Status On-going      PT LONG TERM GOAL #4   Title Pt will report he can work normal work day without feeling any neck or shoulder discomfort    Baseline I don't usually notice discomfort until afternoons, worse on days when I am at a computer longer                   Plan - 05/04/21 1933     Clinical Impression Statement Pt experienced biceps tendon pain following playing tennis this weekend. All UE resisted exercises encouraged abdominal engagement through obliques to reduce shoulder horiz abd.    PT Treatment/Interventions ADLs/Self Care Home Management;Biofeedback;Cryotherapy;Electrical  Stimulation;Iontophoresis 18m/ml Dexamethasone;Moist Heat;Traction;Ultrasound;Therapeutic activities;Neuromuscular re-education;Therapeutic exercise;Patient/family education;Manual techniques;Passive range of motion;Dry needling;Taping    PT Next Visit Plan ERO    PT Home Exercise Plan PRI St unsupported Rt lift with Rt trunk rot. PRI lat hang. [id number]    Consulted and Agree with Plan of Care Patient             Patient will benefit from skilled therapeutic intervention in order to improve the following deficits and impairments:  Increased muscle spasms, Decreased strength, Postural dysfunction, Impaired UE functional use, Decreased range of motion, Pain  Visit Diagnosis: Chronic right shoulder pain  Chronic left-sided low back pain, unspecified whether sciatica present  Cervicalgia     Problem List There are no problems to display for this patient.  Zelpha Messing C. Fabion Gatson PT, DPT 05/04/21 7:43 PM   CM[facility] [address] StreetGSan Joaquin N[place] [address]Phone: [phone]  Fax:  [phone] Name: MDayshon RobackMRN: [id number] of Birth: [birth year]

## 2021-05-09 ENCOUNTER — Encounter (HOSPITAL_BASED_OUTPATIENT_CLINIC_OR_DEPARTMENT_OTHER): Payer: Self-pay | Admitting: Physical Therapy

## 2021-05-09 ENCOUNTER — Ambulatory Visit (HOSPITAL_BASED_OUTPATIENT_CLINIC_OR_DEPARTMENT_OTHER): Payer: PRIVATE HEALTH INSURANCE | Admitting: Physical Therapy

## 2021-05-09 ENCOUNTER — Other Ambulatory Visit: Payer: Self-pay

## 2021-05-09 DIAGNOSIS — M545 Low back pain, unspecified: Secondary | ICD-10-CM

## 2021-05-09 DIAGNOSIS — M25511 Pain in right shoulder: Secondary | ICD-10-CM | POA: Diagnosis not present

## 2021-05-09 DIAGNOSIS — M542 Cervicalgia: Secondary | ICD-10-CM

## 2021-05-09 NOTE — Therapy (Signed)
[facility] [address] [place], [place], [address] Phone: [phone]   Fax:  [phone]  Physical Therapy Treatment  Patient Details  Name: Keith Montoya MRN: [id number] Date of Birth: 1976/04/13 Referring Provider (PT): Keith Apple, MD   Encounter Date: 05/09/2021   PT End of Session - 05/09/21 1608     Visit Number 33    Number of Visits 39    Date for PT Re-Evaluation 05/12/21    Authorization Type UHC    PT Start Time 1603    PT Stop Time 1641    PT Time Calculation (min) 38 min    Activity Tolerance Patient tolerated treatment well    Behavior During Therapy Keith Montoya for tasks assessed/performed             History reviewed. No pertinent past medical history.  History reviewed. No pertinent surgical history.  There were no vitals filed for this visit.   Subjective Assessment - 05/09/21 1605     Subjective I hit both days this weekend for 40-45 min each time. 12-15 service motions and didn't feel too bad later. Happy with how I felt, less targeted pain but a little sluggish. Just a tiny bit of pain at biceps tendon.Pointing to post deltoid- where I feel the most tightness.                               [facility] Adult PT Treatment/Exercise - 05/09/21 0001       Lumbar Exercises: Supine   Other Supine Lumbar Exercises 90/90A frame crunches; V sit with oblique rotation      Shoulder Exercises: Standing   Other Standing Exercises ER press ball up wall, also +diagonal chop wiht 3lb    Other Standing Exercises serve motion 10lb on cable machine                      PT Short Term Goals - 02/03/21 0900       PT SHORT TERM GOAL #1   Title able to demonstrate and report he can maintain good posture at his desk when at work at least 35% of the time    Baseline Pt reports changing desk ergonomics to minimize lat overactivity    Time 4    Period Weeks    Status Partially Met     Target Date 01/10/21      PT SHORT TERM GOAL #2   Title Pt will report 25% less pain in neck and shoulder and in back when standing for long periods of time    Baseline Pt with less "pain" and more "tightness" in R posterior shoulder    Time 4    Period Weeks    Status Partially Met    Target Date 01/10/21               PT Long Term Goals - 03/16/21 1613       PT LONG TERM GOAL #1   Title pt will be able to return to playing tennis due to imrpoved scpular and shoulder stability    Baseline Just playing pickel ball- little to no overhead. tends to do single arm backhand and would like to learn 2-hand to decr hyperext.    Status On-going      PT LONG TERM GOAL #2   Title pt will report 70% less pain when standing so he can attend events for his  kids    Baseline havent really been able to test, learning to stay more on heels in standing in short term events.      PT LONG TERM GOAL #3   Title Pt will be ind with advanced HEP for pain management and strength routine to maintain his activity level    Baseline requires progression    Status On-going      PT LONG TERM GOAL #4   Title Pt will report he can work normal work day without feeling any neck or shoulder discomfort    Baseline I don't usually notice discomfort until afternoons, worse on days when I am at a computer longer                   Plan - 05/09/21 1639     Clinical Impression Statement Tightness in post deltoid in the beginning of treatment that reduced with activation of core strength. Exercises focused on flx/ER ahead of his body to reduce anterior shift of humeral head. Asked him to practice hitting with a wall close behind him to reduce ability to move behind his body.    PT Treatment/Interventions ADLs/Self Care Home Management;Biofeedback;Cryotherapy;Electrical Stimulation;Iontophoresis 98m/ml Dexamethasone;Moist Heat;Traction;Ultrasound;Therapeutic activities;Neuromuscular re-education;Therapeutic  exercise;Patient/family education;Manual techniques;Passive range of motion;Dry needling;Taping    PT Next Visit Plan ERO    PT Home Exercise Plan PRI St unsupported Rt lift with Rt trunk rot. PRI lat hang. [id number]    Consulted and Agree with Plan of Care Patient             Patient will benefit from skilled therapeutic intervention in order to improve the following deficits and impairments:  Increased muscle spasms, Decreased strength, Postural dysfunction, Impaired UE functional use, Decreased range of motion, Pain  Visit Diagnosis: Chronic right shoulder pain  Chronic left-sided low back pain, unspecified whether sciatica present  Cervicalgia     Problem List There are no problems to display for this patient.  Keith Montoya PT, DPT 05/09/21 4:49 PM   CM[facility] MillRe[facility] [address] [address]Phone: [phone]  Fax:  [phone] Name: Keith SchroeterMRN: [id number] of Birth: 102/10/1976

## 2021-05-11 ENCOUNTER — Ambulatory Visit (HOSPITAL_BASED_OUTPATIENT_CLINIC_OR_DEPARTMENT_OTHER): Payer: PRIVATE HEALTH INSURANCE | Admitting: Physical Therapy

## 2021-05-11 ENCOUNTER — Other Ambulatory Visit: Payer: Self-pay

## 2021-05-11 ENCOUNTER — Encounter (HOSPITAL_BASED_OUTPATIENT_CLINIC_OR_DEPARTMENT_OTHER): Payer: Self-pay | Admitting: Physical Therapy

## 2021-05-11 DIAGNOSIS — M542 Cervicalgia: Secondary | ICD-10-CM

## 2021-05-11 DIAGNOSIS — M25511 Pain in right shoulder: Secondary | ICD-10-CM | POA: Diagnosis not present

## 2021-05-11 DIAGNOSIS — M545 Low back pain, unspecified: Secondary | ICD-10-CM

## 2021-05-11 DIAGNOSIS — G8929 Other chronic pain: Secondary | ICD-10-CM

## 2021-05-12 ENCOUNTER — Encounter (HOSPITAL_BASED_OUTPATIENT_CLINIC_OR_DEPARTMENT_OTHER): Payer: Self-pay | Admitting: Physical Therapy

## 2021-05-12 NOTE — Addendum Note (Signed)
Addended by: Fredrich Romans on: 05/12/2021 07:13 PM   Modules accepted: Orders

## 2021-05-12 NOTE — Therapy (Signed)
[facility] [address] [place], [place], [address] Phone: [phone]   Fax:  [phone]  Physical Therapy Treatment  Patient Details  Name: Keith Montoya MRN: [id number] Date of Birth: 1975-11-20 Referring Provider (PT): Laroy Apple, MD   Encounter Date: 05/11/2021   PT End of Session - 05/11/21 1603     Visit Number 34    Number of Visits 42    Date for PT Re-Evaluation 07/08/21    Authorization Type UHC    PT Start Time 1638    PT Stop Time 1636    PT Time Calculation (min) 39 min    Activity Tolerance Patient tolerated treatment well    Behavior During Therapy [facility] for tasks assessed/performed             History reviewed. No pertinent past medical history.  History reviewed. No pertinent surgical history.  There were no vitals filed for this visit.   Subjective Assessment - 05/11/21 2030     Subjective My Rt upper trap is really tight and My Lt quads are tight. Would like to continue rehab 1/week to introduce activities.    Currently in Pain? Yes    Pain Score --   "not bad"   Pain Location Shoulder    Pain Orientation Right    Pain Descriptors / Indicators Tightness    Aggravating Factors  tennis    Pain Relieving Factors exercises, manual therapy                [facility] PT Assessment - 05/12/21 0001       Assessment   Medical Diagnosis rt shoulder pain w/ supraspinatus tendinosis, cervicalagia, and left lumbar spondylolysis    Referring Provider (PT) Laroy Apple, MD    Hand Dominance Right    Prior Therapy a few years ago      Precautions   Precautions None      Restrictions   Weight Bearing Restrictions No      Sensation   Additional Comments Rt arm is a lot better but is working back into tennis, I feel like I can make a fist now      AROM   Cervical - Right Side Bend 50    Cervical - Left Side Bend 48    Cervical - Right Rotation 80    Cervical - Left Rotation 84       Palpation   Patella mobility tightness Rt upper trap, infraspinatus                           [facility] Adult PT Treatment/Exercise - 05/12/21 0001       Lumbar Exercises: Supine   Other Supine Lumbar Exercises 90/90 hip lift with Rt arm reach      Manual Therapy   Soft tissue mobilization IASTM Lt quads, Rt upper traps                    PT Education - 05/11/21 2037     Education Details goals, HEP update, POC    Person(s) Educated Patient    Methods Explanation;Demonstration;Tactile cues;Verbal cues;Handout    Comprehension Verbalized understanding;Returned demonstration;Verbal cues required;Tactile cues required;Need further instruction              PT Short Term Goals - 02/03/21 0900       PT SHORT TERM GOAL #1   Title able to demonstrate and report he can maintain good posture  at his desk when at work at least 35% of the time    Baseline Pt reports changing desk ergonomics to minimize lat overactivity    Time 4    Period Weeks    Status Partially Met    Target Date 01/10/21      PT SHORT TERM GOAL #2   Title Pt will report 25% less pain in neck and shoulder and in back when standing for long periods of time    Baseline Pt with less "pain" and more "tightness" in R posterior shoulder    Time 4    Period Weeks    Status Partially Met    Target Date 01/10/21               PT Long Term Goals - 05/11/21 1559       PT LONG TERM GOAL #1   Title pt will be able to return to playing tennis due to imrpoved scpular and shoulder stability    Baseline has returned to tennis about 40-50%, "I am hitting, not playiing"      PT LONG TERM GOAL #2   Title pt will report 70% less pain when standing so he can attend events for his kids    Baseline as of right now, yes. will cont to monitor    Status Partially Met      PT LONG TERM GOAL #3   Title Pt will be ind with advanced HEP for pain management and strength routine to maintain his activity  level    Baseline requires progression      PT LONG TERM GOAL #4   Title Pt will report he can work normal work day without feeling any neck or shoulder discomfort    Baseline day to day is better    Status Achieved                   Plan - 05/11/21 1602     Clinical Impression Statement continued on path of use of PRI to address cross pattern with good results. notable improvement in passive Rt GHJ IR as result which is expected. Trigger point in upper trap reduced with manual therapy.    PT Frequency 1x / week    PT Duration 8 weeks    PT Treatment/Interventions ADLs/Self Care Home Management;Biofeedback;Cryotherapy;Electrical Stimulation;Iontophoresis 20m/ml Dexamethasone;Moist Heat;Traction;Ultrasound;Therapeutic activities;Neuromuscular re-education;Therapeutic exercise;Patient/family education;Manual techniques;Passive range of motion;Dry needling;Taping    PT Next Visit Plan review PRI    PT Home Exercise Plan PRI St unsupported Rt lift with Rt trunk rot. PRI lat hang. [id number]    Consulted and Agree with Plan of Care Patient             Patient will benefit from skilled therapeutic intervention in order to improve the following deficits and impairments:  Increased muscle spasms, Decreased strength, Postural dysfunction, Impaired UE functional use, Decreased range of motion, Pain  Visit Diagnosis: Chronic right shoulder pain  Chronic left-sided low back pain, unspecified whether sciatica present  Cervicalgia     Problem List There are no problems to display for this patient.  Keith Montoya PT, DPT 05/12/21 7:11 PM   CL[facility] [address] County StreetGFunkstown N[place] [address]Phone: [phone]  Fax:  [phone] Name: Keith WinthropMRN: [id number] of Birth: 1May 15, 1977

## 2021-05-18 ENCOUNTER — Ambulatory Visit (HOSPITAL_BASED_OUTPATIENT_CLINIC_OR_DEPARTMENT_OTHER): Payer: PRIVATE HEALTH INSURANCE | Admitting: Physical Therapy

## 2021-05-25 ENCOUNTER — Ambulatory Visit (HOSPITAL_BASED_OUTPATIENT_CLINIC_OR_DEPARTMENT_OTHER): Payer: PRIVATE HEALTH INSURANCE | Admitting: Physical Therapy

## 2021-06-01 ENCOUNTER — Other Ambulatory Visit: Payer: Self-pay

## 2021-06-01 ENCOUNTER — Encounter (HOSPITAL_BASED_OUTPATIENT_CLINIC_OR_DEPARTMENT_OTHER): Payer: Self-pay | Admitting: Physical Therapy

## 2021-06-01 ENCOUNTER — Ambulatory Visit (HOSPITAL_BASED_OUTPATIENT_CLINIC_OR_DEPARTMENT_OTHER): Payer: PRIVATE HEALTH INSURANCE | Attending: Physical Medicine and Rehabilitation | Admitting: Physical Therapy

## 2021-06-01 DIAGNOSIS — M25511 Pain in right shoulder: Secondary | ICD-10-CM | POA: Diagnosis not present

## 2021-06-01 DIAGNOSIS — M542 Cervicalgia: Secondary | ICD-10-CM | POA: Insufficient documentation

## 2021-06-01 DIAGNOSIS — M545 Low back pain, unspecified: Secondary | ICD-10-CM | POA: Diagnosis present

## 2021-06-01 DIAGNOSIS — G8929 Other chronic pain: Secondary | ICD-10-CM | POA: Diagnosis present

## 2021-06-02 NOTE — Therapy (Signed)
[facility] [address] [place], [place], [address] Phone: [phone]   Fax:  [phone]  Physical Therapy Treatment  Patient Details  Name: Khylin Gutridge MRN: [id number] Date of Birth: 05-19-1976 Referring Provider (PT): Laroy Apple, MD   Encounter Date: 06/01/2021   PT End of Session - 06/01/21 1646     Visit Number 35    Number of Visits 42    Date for PT Re-Evaluation 07/08/21    Authorization Type UHC    PT Start Time 1602    PT Stop Time 1645    PT Time Calculation (min) 43 min    Activity Tolerance Patient tolerated treatment well    Behavior During Therapy [facility] for tasks assessed/performed             History reviewed. No pertinent past medical history.  History reviewed. No pertinent surgical history.  There were no vitals filed for this visit.   Subjective Assessment - 06/01/21 1606     Subjective I had COVID and symptoms. I did a little physical activity for the first time and my whole back just felt tight. Only pain after working on laptop at a tournament for 4-5 hrs prior.    Currently in Pain? No/denies                                         PT Short Term Goals - 02/03/21 0900       PT SHORT TERM GOAL #1   Title able to demonstrate and report he can maintain good posture at his desk when at work at least 35% of the time    Baseline Pt reports changing desk ergonomics to minimize lat overactivity    Time 4    Period Weeks    Status Partially Met    Target Date 01/10/21      PT SHORT TERM GOAL #2   Title Pt will report 25% less pain in neck and shoulder and in back when standing for long periods of time    Baseline Pt with less "pain" and more "tightness" in R posterior shoulder    Time 4    Period Weeks    Status Partially Met    Target Date 01/10/21               PT Long Term Goals - 05/11/21 1559       PT LONG TERM GOAL #1   Title pt  will be able to return to playing tennis due to imrpoved scpular and shoulder stability    Baseline has returned to tennis about 40-50%, "I am hitting, not playiing"      PT LONG TERM GOAL #2   Title pt will report 70% less pain when standing so he can attend events for his kids    Baseline as of right now, yes. will cont to monitor    Status Partially Met      PT LONG TERM GOAL #3   Title Pt will be ind with advanced HEP for pain management and strength routine to maintain his activity level    Baseline requires progression      PT LONG TERM GOAL #4   Title Pt will report he can work normal work day without feeling any neck or shoulder discomfort    Baseline day to day is better    Status Achieved  Plan - 06/02/21 0823     Clinical Impression Statement Returns following COVID infection and feeling better now. He has not been doing his HEP due to being sick and has notable decr in abdominal engagement. Focused on scapular motor control today- required tactile cues for proper movement.    PT Treatment/Interventions ADLs/Self Care Home Management;Biofeedback;Cryotherapy;Electrical Stimulation;Iontophoresis 80m/ml Dexamethasone;Moist Heat;Traction;Ultrasound;Therapeutic activities;Neuromuscular re-education;Therapeutic exercise;Patient/family education;Manual techniques;Passive range of motion;Dry needling;Taping    PT Next Visit Plan core activation, scapular control    PT Home Exercise Plan PRI St unsupported Rt lift with Rt trunk rot. PRI lat hang. [id number]    Consulted and Agree with Plan of Care Patient             Patient will benefit from skilled therapeutic intervention in order to improve the following deficits and impairments:  Increased muscle spasms, Decreased strength, Postural dysfunction, Impaired UE functional use, Decreased range of motion, Pain  Visit Diagnosis: Chronic right shoulder pain  Chronic left-sided low back pain, unspecified  whether sciatica present  Cervicalgia     Problem List There are no problems to display for this patient.   JSelinda Eon7/21/2022, 8:27 AM  [facility] [address]Phone: [phone]  Fax:  [phone] Name: MJiyan WalkowskiMRN: [id number] of Birth: [birth year]

## 2021-06-07 ENCOUNTER — Encounter (HOSPITAL_BASED_OUTPATIENT_CLINIC_OR_DEPARTMENT_OTHER): Payer: Self-pay | Admitting: Physical Therapy

## 2021-06-07 ENCOUNTER — Ambulatory Visit (HOSPITAL_BASED_OUTPATIENT_CLINIC_OR_DEPARTMENT_OTHER): Payer: PRIVATE HEALTH INSURANCE | Admitting: Physical Therapy

## 2021-06-07 ENCOUNTER — Other Ambulatory Visit: Payer: Self-pay

## 2021-06-07 DIAGNOSIS — G8929 Other chronic pain: Secondary | ICD-10-CM

## 2021-06-07 DIAGNOSIS — M25511 Pain in right shoulder: Secondary | ICD-10-CM | POA: Diagnosis not present

## 2021-06-07 DIAGNOSIS — M545 Low back pain, unspecified: Secondary | ICD-10-CM

## 2021-06-07 DIAGNOSIS — M542 Cervicalgia: Secondary | ICD-10-CM

## 2021-06-07 NOTE — Therapy (Signed)
[facility] [address] [place], [place], [address] Phone: [phone]   Fax:  [phone]  Physical Therapy Treatment  Patient Details  Name: Keith Montoya MRN: [id number] Date of Birth: July 29, 1976 Referring Provider (PT): Keith Apple, MD   Encounter Date: 06/07/2021   PT End of Session - 06/07/21 1603     Visit Number 36    Number of Visits 42    Date for PT Re-Evaluation 07/08/21    Authorization Type UHC    PT Start Time 1601    PT Stop Time 1640    PT Time Calculation (min) 39 min    Activity Tolerance Patient tolerated treatment well    Behavior During Therapy [facility] for tasks assessed/performed             History reviewed. No pertinent past medical history.  History reviewed. No pertinent surgical history.  There were no vitals filed for this visit.   Subjective Assessment - 06/07/21 1603     Subjective I went to [place] for the weekend and was able to be on my feet a lot.    Currently in Pain? Yes    Pain Location Back    Pain Orientation Lower    Pain Descriptors / Indicators Tightness    Aggravating Factors  unsure                [facility] PT Assessment - 06/07/21 0001       Palpation   Palpation comment concordant pain and tightness at thoraco lumbar junction                           [facility] Adult PT Treatment/Exercise - 06/07/21 0001       Lumbar Exercises: Seated   Other Seated Lumbar Exercises C sit with A frame- neutral & turnout; with alt punches      Lumbar Exercises: Supine   AB Set Limitations +lower body curl    Dead Bug Limitations with legs extended    Other Supine Lumbar Exercises double leg lower/lift+lower body curl      Manual Therapy   Joint Mobilization lower rib cage ER    Soft tissue mobilization bil thoracolumbar paraspinals following DN              Trigger Point Dry Needling - 06/07/21 0001     Other Dry Needling thoraco lumbar  paraspinals                    PT Short Term Goals - 02/03/21 0900       PT SHORT TERM GOAL #1   Title able to demonstrate and report he can maintain good posture at his desk when at work at least 35% of the time    Baseline Pt reports changing desk ergonomics to minimize lat overactivity    Time 4    Period Weeks    Status Partially Met    Target Date 01/10/21      PT SHORT TERM GOAL #2   Title Pt will report 25% less pain in neck and shoulder and in back when standing for long periods of time    Baseline Pt with less "pain" and more "tightness" in R posterior shoulder    Time 4    Period Weeks    Status Partially Met    Target Date 01/10/21               PT  Long Term Goals - 05/11/21 1559       PT LONG TERM GOAL #1   Title pt will be able to return to playing tennis due to imrpoved scpular and shoulder stability    Baseline has returned to tennis about 40-50%, "I am hitting, not playiing"      PT LONG TERM GOAL #2   Title pt will report 70% less pain when standing so he can attend events for his kids    Baseline as of right now, yes. will cont to monitor    Status Partially Met      PT LONG TERM GOAL #3   Title Pt will be ind with advanced HEP for pain management and strength routine to maintain his activity level    Baseline requires progression      PT LONG TERM GOAL #4   Title Pt will report he can work normal work day without feeling any neck or shoulder discomfort    Baseline day to day is better    Status Achieved                   Plan - 06/07/21 1646     Clinical Impression Statement DN reduced spasm along paraspinals and focused on building core stability. denied pain following treatment    PT Treatment/Interventions ADLs/Self Care Home Management;Biofeedback;Cryotherapy;Electrical Stimulation;Iontophoresis 23m/ml Dexamethasone;Moist Heat;Traction;Ultrasound;Therapeutic activities;Neuromuscular re-education;Therapeutic  exercise;Patient/family education;Manual techniques;Passive range of motion;Dry needling;Taping    PT Next Visit Plan review core HEP, standing core activation, played tennis?    PT Home Exercise Plan PRI St unsupported Rt lift with Rt trunk rot. PRI lat hang. [id number]    Consulted and Agree with Plan of Care Patient             Patient will benefit from skilled therapeutic intervention in order to improve the following deficits and impairments:  Increased muscle spasms, Decreased strength, Postural dysfunction, Impaired UE functional use, Decreased range of motion, Pain  Visit Diagnosis: Chronic right shoulder pain  Chronic left-sided low back pain, unspecified whether sciatica present  Cervicalgia     Problem List There are no problems to display for this patient. Keith [facility] PT, DPT 06/07/21 4:48 PM  [facility] [address] [address]Phone: [phone]  Fax:  [phone] Name: Keith PileMRN: [id number] of Birth: 1March 19, 1977

## 2021-06-14 ENCOUNTER — Ambulatory Visit (HOSPITAL_BASED_OUTPATIENT_CLINIC_OR_DEPARTMENT_OTHER): Payer: PRIVATE HEALTH INSURANCE | Attending: Physical Medicine and Rehabilitation | Admitting: Physical Therapy

## 2021-06-14 ENCOUNTER — Other Ambulatory Visit: Payer: Self-pay

## 2021-06-14 ENCOUNTER — Encounter (HOSPITAL_BASED_OUTPATIENT_CLINIC_OR_DEPARTMENT_OTHER): Payer: Self-pay | Admitting: Physical Therapy

## 2021-06-14 DIAGNOSIS — M542 Cervicalgia: Secondary | ICD-10-CM | POA: Insufficient documentation

## 2021-06-14 DIAGNOSIS — M25511 Pain in right shoulder: Secondary | ICD-10-CM | POA: Diagnosis present

## 2021-06-14 DIAGNOSIS — G8929 Other chronic pain: Secondary | ICD-10-CM | POA: Diagnosis present

## 2021-06-14 DIAGNOSIS — M545 Low back pain, unspecified: Secondary | ICD-10-CM | POA: Diagnosis present

## 2021-06-14 NOTE — Therapy (Signed)
[facility] [address] [place], [place], [address] Phone: [phone]   Fax:  [phone]  Physical Therapy Treatment  Patient Details  Name: Keith Montoya MRN: [id number] Date of Birth: [birth year] Referring Provider (PT): Laroy Apple, MD   Encounter Date: 06/14/2021   PT End of Session - 06/14/21 1601     Visit Number 37    Number of Visits 42    Date for PT Re-Evaluation 07/08/21    Authorization Type UHC    PT Start Time 6979    PT Stop Time 1640    PT Time Calculation (min) 43 min    Activity Tolerance Patient tolerated treatment well    Behavior During Therapy [facility] for tasks assessed/performed             History reviewed. No pertinent past medical history.  History reviewed. No pertinent surgical history.  There were no vitals filed for this visit.   Subjective Assessment - 06/14/21 1557     Subjective Hit twice since last visit. Struggling to get back into the groove of doing exercises. Did notice Lt lateral quads got a little tight. I did try to start serving without symptoms the next day. I was working at my home desk which is not great posture and got ahuge knot in lat.    Currently in Pain? No/denies                               [facility] Adult PT Treatment/Exercise - 06/14/21 0001       Lumbar Exercises: Standing   Other Standing Lumbar Exercises lateral lunges with mirror for VC      Shoulder Exercises: Prone   Other Prone Exercises planks- hands on bosu fwd/back rocking      Shoulder Exercises: ROM/Strengthening   Other ROM/Strengthening Exercises cable- lat pull, horiz abd, D2 flexion, lat lunge with anchored at midline      Manual Therapy   Soft tissue mobilization Rt Lats              Trigger Point Dry Needling - 06/14/21 0001     Latissimus dorsi Response Twitch response elicited;Palpable increased muscle length   Rt                   PT Short  Term Goals - 02/03/21 0900       PT SHORT TERM GOAL #1   Title able to demonstrate and report he can maintain good posture at his desk when at work at least 35% of the time    Baseline Pt reports changing desk ergonomics to minimize lat overactivity    Time 4    Period Weeks    Status Partially Met    Target Date 01/10/21      PT SHORT TERM GOAL #2   Title Pt will report 25% less pain in neck and shoulder and in back when standing for long periods of time    Baseline Pt with less "pain" and more "tightness" in R posterior shoulder    Time 4    Period Weeks    Status Partially Met    Target Date 01/10/21               PT Long Term Goals - 05/11/21 1559       PT LONG TERM GOAL #1   Title pt will be able to return to playing tennis due to  imrpoved scpular and shoulder stability    Baseline has returned to tennis about 40-50%, "I am hitting, not playiing"      PT LONG TERM GOAL #2   Title pt will report 70% less pain when standing so he can attend events for his kids    Baseline as of right now, yes. will cont to monitor    Status Partially Met      PT LONG TERM GOAL #3   Title Pt will be ind with advanced HEP for pain management and strength routine to maintain his activity level    Baseline requires progression      PT LONG TERM GOAL #4   Title Pt will report he can work normal work day without feeling any neck or shoulder discomfort    Baseline day to day is better    Status Achieved                   Plan - 06/14/21 1655     Clinical Impression Statement DN to Rt lats reduce trigger point but reported still feeling tight until doing exercises. Cable machine diagonals with addition of trunk rotation to avoid crossing midline & scapular retraction on extension & abd motions.    PT Treatment/Interventions ADLs/Self Care Home Management;Biofeedback;Cryotherapy;Electrical Stimulation;Iontophoresis 58m/ml Dexamethasone;Moist Heat;Traction;Ultrasound;Therapeutic  activities;Neuromuscular re-education;Therapeutic exercise;Patient/family education;Manual techniques;Passive range of motion;Dry needling;Taping    PT Next Visit Plan cont core, lat stability    PT Home Exercise Plan PRI St unsupported Rt lift with Rt trunk rot. PRI lat hang. [id number]    Consulted and Agree with Plan of Care Patient             Patient will benefit from skilled therapeutic intervention in order to improve the following deficits and impairments:  Increased muscle spasms, Decreased strength, Postural dysfunction, Impaired UE functional use, Decreased range of motion, Pain  Visit Diagnosis: Chronic right shoulder pain  Chronic left-sided low back pain, unspecified whether sciatica present  Cervicalgia     Problem List There are no problems to display for this patient. Avant Printy C. Jazzmine Kleiman PT, DPT 06/14/21 4:57 PM   [facility] [address] [address]Phone: [phone]  Fax:  [phone] Name: MHero KulishMRN: [id number] of Birth: [birth year]

## 2021-06-21 ENCOUNTER — Encounter (HOSPITAL_BASED_OUTPATIENT_CLINIC_OR_DEPARTMENT_OTHER): Payer: Self-pay | Admitting: Physical Therapy

## 2021-06-21 ENCOUNTER — Ambulatory Visit (HOSPITAL_BASED_OUTPATIENT_CLINIC_OR_DEPARTMENT_OTHER): Payer: PRIVATE HEALTH INSURANCE | Admitting: Physical Therapy

## 2021-06-21 ENCOUNTER — Other Ambulatory Visit: Payer: Self-pay

## 2021-06-21 DIAGNOSIS — M542 Cervicalgia: Secondary | ICD-10-CM

## 2021-06-21 DIAGNOSIS — M545 Low back pain, unspecified: Secondary | ICD-10-CM

## 2021-06-21 DIAGNOSIS — M25511 Pain in right shoulder: Secondary | ICD-10-CM | POA: Diagnosis not present

## 2021-06-21 DIAGNOSIS — G8929 Other chronic pain: Secondary | ICD-10-CM

## 2021-06-21 NOTE — Therapy (Signed)
[facility] [address] [place], [place], [address] Phone: [phone]   Fax:  [phone]  Physical Therapy Treatment  Patient Details  Name: Keith Montoya MRN: [id number] Date of Birth: May 27, 1976 Referring Provider (PT): Laroy Apple, MD   Encounter Date: 06/21/2021   PT End of Session - 06/21/21 1615     Visit Number 38    Number of Visits 42    Date for PT Re-Evaluation 07/08/21    Authorization Type UHC    PT Start Time 7106    PT Stop Time 1644    PT Time Calculation (min) 49 min    Activity Tolerance Patient tolerated treatment well    Behavior During Therapy [facility] for tasks assessed/performed             History reviewed. No pertinent past medical history.  History reviewed. No pertinent surgical history.  There were no vitals filed for this visit.   Subjective Assessment - 06/21/21 1557     Subjective Played tennis for the first time on sudnay evening- Monday evening had Lt lateral thigh pain in high IT/VL. It did not stop me from playing but I did feel it. Feel tension in Rt shoulder today. High volley is difficult, tried to reduce excessive extension.    Currently in Pain? Yes    Pain Location Shoulder    Pain Orientation Right    Pain Descriptors / Indicators Tightness                               [facility] Adult PT Treatment/Exercise - 06/21/21 0001       Lumbar Exercises: Standing   Other Standing Lumbar Exercises PNF with cable machines- cues for core activation with trunk rotation.      Lumbar Exercises: Supine   Other Supine Lumbar Exercises Rt arm reach for ceiling- beg lift for core strengthening      Shoulder Exercises: Prone   Horizontal ABduction 2 Limitations prone in faceplate- ER to 90, also added reach OH from horiz abd      Manual Therapy   Manual therapy comments skilled palpation and monitoring during TPDN    Soft tissue mobilization deltoid,  infraspinatus              Trigger Point Dry Needling - 06/21/21 0001     Muscles Treated Upper Quadrant Deltoid    Infraspinatus Response Twitch response elicited;Palpable increased muscle length    Deltoid Response Twitch response elicited;Palpable increased muscle length   post belly                   PT Short Term Goals - 02/03/21 0900       PT SHORT TERM GOAL #1   Title able to demonstrate and report he can maintain good posture at his desk when at work at least 35% of the time    Baseline Pt reports changing desk ergonomics to minimize lat overactivity    Time 4    Period Weeks    Status Partially Met    Target Date 01/10/21      PT SHORT TERM GOAL #2   Title Pt will report 25% less pain in neck and shoulder and in back when standing for long periods of time    Baseline Pt with less "pain" and more "tightness" in R posterior shoulder    Time 4    Period Weeks  Status Partially Met    Target Date 01/10/21               PT Long Term Goals - 05/11/21 1559       PT LONG TERM GOAL #1   Title pt will be able to return to playing tennis due to imrpoved scpular and shoulder stability    Baseline has returned to tennis about 40-50%, "I am hitting, not playiing"      PT LONG TERM GOAL #2   Title pt will report 70% less pain when standing so he can attend events for his kids    Baseline as of right now, yes. will cont to monitor    Status Partially Met      PT LONG TERM GOAL #3   Title Pt will be ind with advanced HEP for pain management and strength routine to maintain his activity level    Baseline requires progression      PT LONG TERM GOAL #4   Title Pt will report he can work normal work day without feeling any neck or shoulder discomfort    Baseline day to day is better    Status Achieved                   Plan - 06/21/21 1656     Clinical Impression Statement Extension in trunk rotation challenges created tension in Lt quads-  reduced with Rt arm reach for oblique activation in supine. Asked him to tie blue tband to tennis racket for resistance in target hitting.    PT Treatment/Interventions ADLs/Self Care Home Management;Biofeedback;Cryotherapy;Electrical Stimulation;Iontophoresis 59m/ml Dexamethasone;Moist Heat;Traction;Ultrasound;Therapeutic activities;Neuromuscular re-education;Therapeutic exercise;Patient/family education;Manual techniques;Passive range of motion;Dry needling;Taping    PT Next Visit Plan how did tennis go with blue tband? cont oblique training    PT Home Exercise Plan PRI St unsupported Rt lift with Rt trunk rot. PRI lat hang. [id number]    Consulted and Agree with Plan of Care Patient             Patient will benefit from skilled therapeutic intervention in order to improve the following deficits and impairments:  Increased muscle spasms, Decreased strength, Postural dysfunction, Impaired UE functional use, Decreased range of motion, Pain  Visit Diagnosis: Chronic right shoulder pain  Chronic left-sided low back pain, unspecified whether sciatica present  Cervicalgia    Etheleen Valtierra C. Sandor Arboleda PT, DPT 06/21/21 4:59 PM  Problem List There are no problems to display for this patient.  CL[facility]342 NE[address] Golf Drive[place]udson [place] [address]Phone: [phone]  Fax:  [phone] Name: Keith BidingerMRN: [id number] of Birth: [birth year]

## 2021-07-06 ENCOUNTER — Encounter (HOSPITAL_BASED_OUTPATIENT_CLINIC_OR_DEPARTMENT_OTHER): Payer: Self-pay | Admitting: Physical Therapy

## 2021-07-06 ENCOUNTER — Other Ambulatory Visit: Payer: Self-pay

## 2021-07-06 ENCOUNTER — Ambulatory Visit (HOSPITAL_BASED_OUTPATIENT_CLINIC_OR_DEPARTMENT_OTHER): Payer: PRIVATE HEALTH INSURANCE | Admitting: Physical Therapy

## 2021-07-06 DIAGNOSIS — G8929 Other chronic pain: Secondary | ICD-10-CM

## 2021-07-06 DIAGNOSIS — M542 Cervicalgia: Secondary | ICD-10-CM

## 2021-07-06 DIAGNOSIS — M25511 Pain in right shoulder: Secondary | ICD-10-CM

## 2021-07-06 DIAGNOSIS — M545 Low back pain, unspecified: Secondary | ICD-10-CM

## 2021-07-06 NOTE — Therapy (Signed)
[facility] [address] [place], [place], [address] Phone: [phone]   Fax:  [phone]  Physical Therapy Treatment  Patient Details  Name: Keith Montoya MRN: [id number] Date of Birth: 06-11-1976 Referring Provider (PT): Laroy Apple, MD   Encounter Date: 07/06/2021   PT End of Session - 07/06/21 1432     Visit Number 39    Number of Visits 42    Date for PT Re-Evaluation 07/08/21    Authorization Type [employer]    PT Start Time 9774    PT Stop Time 1511    PT Time Calculation (min) 39 min    Activity Tolerance Patient tolerated treatment well    Behavior During Therapy [facility] for tasks assessed/performed             History reviewed. No pertinent past medical history.  History reviewed. No pertinent surgical history.  There were no vitals filed for this visit.   Subjective Assessment - 07/06/21 1433     Subjective I have not been as consistent doing exercises which makes me feel like I had a setback. [place] much at the beach and felt a lot of tightness one day in Rt shoulder and Lt leg. My f.u with MD is day after labor day.                [facility] PT Assessment - 07/06/21 0001       Assessment   Medical Diagnosis rt shoulder pain w/ supraspinatus tendinosis, cervicalagia, and left lumbar spondylolysis    Referring Provider (PT) Laroy Apple, MD      Sensation   Additional Comments [facility]                           [facility] Adult PT Treatment/Exercise - 07/06/21 0001       Lumbar Exercises: Aerobic   Stationary Bike 5 min warmup      Lumbar Exercises: Prone   Other Prone Lumbar Exercises primal push ups    Other Prone Lumbar Exercises knee/elbow planks with scapular protraciton/retraction      Manual Therapy   Manual therapy comments skilled palpation and monitoring during TPDN    Joint Mobilization scapular sup to inf glide bilat    Soft tissue mobilization bil upper trap &  levator              Trigger Point Dry Needling - 07/06/21 0001     Other Dry Needling levator scap Rt    Upper Trapezius Response Twitch reponse elicited;Palpable increased muscle length   Rt                 PT Education - 07/06/21 1511     Education Details long term care, options for short term relief, incoorporating into ADLs.    Person(s) Educated Patient    Methods Explanation    Comprehension Verbalized understanding              PT Short Term Goals - 02/03/21 0900       PT SHORT TERM GOAL #1   Title able to demonstrate and report he can maintain good posture at his desk when at work at least 35% of the time    Baseline Pt reports changing desk ergonomics to minimize lat overactivity    Time 4    Period Weeks    Status Partially Met    Target Date 01/10/21      PT  SHORT TERM GOAL #2   Title Pt will report 25% less pain in neck and shoulder and in back when standing for long periods of time    Baseline Pt with less "pain" and more "tightness" in R posterior shoulder    Time 4    Period Weeks    Status Partially Met    Target Date 01/10/21               PT Long Term Goals - 07/06/21 1445       PT LONG TERM GOAL #1   Title pt will be able to return to playing tennis due to imrpoved scpular and shoulder stability    Baseline has returned to tennis about 40-50%, "I am hitting, not playiing"      PT LONG TERM GOAL #2   Status Achieved      PT LONG TERM GOAL #3   Title Pt will be ind with advanced HEP for pain management and strength routine to maintain his activity level    Status Achieved      PT LONG TERM GOAL #4   Title Pt will report he can work normal work day without feeling any neck or shoulder discomfort    Status Achieved                   Plan - 07/06/21 1512     Clinical Impression Statement Discussed progress and what care looks like long term. Ultimately he is very hypermobile and that will not decrease which  makes return to old habits very easy without awareness and consistent exercise. He does have one more appointment next tuesday where we will go over HEP one more time and discuss any further questions. I do foresee him needing PT in the future as he progresses ADLs and finds patterns that are continuing but at this time he is prepared to work independently for a while.    PT Treatment/Interventions ADLs/Self Care Home Management;Biofeedback;Cryotherapy;Electrical Stimulation;Iontophoresis 21m/ml Dexamethasone;Moist Heat;Traction;Ultrasound;Therapeutic activities;Neuromuscular re-education;Therapeutic exercise;Patient/family education;Manual techniques;Passive range of motion;Dry needling;Taping    PT Next Visit Plan final review of HEP    PT Home Exercise Plan PRI St unsupported Rt lift with Rt trunk rot. PRI lat hang. [id number]    Consulted and Agree with Plan of Care Patient             Patient will benefit from skilled therapeutic intervention in order to improve the following deficits and impairments:  Increased muscle spasms, Decreased strength, Postural dysfunction, Impaired UE functional use, Decreased range of motion, Pain  Visit Diagnosis: Chronic right shoulder pain  Chronic left-sided low back pain, unspecified whether sciatica present  Cervicalgia     Problem List There are no problems to display for this patient.  Avon Mergenthaler C. Lubertha Leite PT, DPT 07/06/21 3:21 PM   [facility] [address] [address]Phone: [phone]  Fax:  [phone] Name: Keith MattyMRN: [id number] of Birth: 1September 14, 1977

## 2021-07-11 ENCOUNTER — Encounter (HOSPITAL_BASED_OUTPATIENT_CLINIC_OR_DEPARTMENT_OTHER): Payer: PRIVATE HEALTH INSURANCE | Admitting: Physical Therapy

## 2021-07-12 ENCOUNTER — Ambulatory Visit (HOSPITAL_BASED_OUTPATIENT_CLINIC_OR_DEPARTMENT_OTHER): Payer: PRIVATE HEALTH INSURANCE | Admitting: Physical Therapy

## 2021-07-12 ENCOUNTER — Other Ambulatory Visit: Payer: Self-pay

## 2021-07-12 ENCOUNTER — Encounter (HOSPITAL_BASED_OUTPATIENT_CLINIC_OR_DEPARTMENT_OTHER): Payer: Self-pay | Admitting: Physical Therapy

## 2021-07-12 DIAGNOSIS — M545 Low back pain, unspecified: Secondary | ICD-10-CM

## 2021-07-12 DIAGNOSIS — G8929 Other chronic pain: Secondary | ICD-10-CM

## 2021-07-12 DIAGNOSIS — M542 Cervicalgia: Secondary | ICD-10-CM

## 2021-07-12 DIAGNOSIS — M25511 Pain in right shoulder: Secondary | ICD-10-CM | POA: Diagnosis not present

## 2021-07-12 NOTE — Therapy (Signed)
[facility], [place], [address] Phone: [phone]   Fax:  [phone]  Physical Therapy Treatment/Discharge  Patient Details  Name: Keith Montoya MRN: [id number] Date of Birth: [birth year] Referring Provider (PT): Laroy Apple, MD   Encounter Date: 07/12/2021   PT End of Session - 07/12/21 0924     Visit Number 40    Date for PT Re-Evaluation 07/12/21    PT Start Time 0922    PT Stop Time 0957    PT Time Calculation (min) 35 min    Activity Tolerance Patient tolerated treatment well    Behavior During Therapy [facility] for tasks assessed/performed             History reviewed. No pertinent past medical history.  History reviewed. No pertinent surgical history.  There were no vitals filed for this visit.   Subjective Assessment - 07/12/21 0923     Subjective I feel pretty good after playing tennis and pickle ball the last couple of days. Get tight in upper Lt quads when playing tennis. Did not feel the jaw but I feel like I have a good body awareness.                [facility] PT Assessment - 07/12/21 0001       Assessment   Medical Diagnosis rt shoulder pain w/ supraspinatus tendinosis, cervicalagia, and left lumbar spondylolysis    Referring Provider (PT) Laroy Apple, MD      Sensation   Additional Comments WFL      AROM   Overall AROM Comments grossly hyermobile, end range impingement in post shoulder is consistent      Strength   Overall Strength Comments gross 5/5 with significant improvement in core control      Palpation   Palpation comment trigger point in Lt quads- instructed in self-reduction                           [facility] Adult PT Treatment/Exercise - 07/12/21 0001       Exercises   Exercises Other Exercises    Other Exercises  reviewed HEP list today, added "ready stance" hold for posture/endurance                    PT Education -  07/12/21 1004     Education Details goals, long term care    Person(s) Educated Patient    Methods Explanation    Comprehension Verbalized understanding              PT Short Term Goals - 07/12/21 1003       PT SHORT TERM GOAL #1   Title able to demonstrate and report he can maintain good posture at his desk when at work at least 35% of the time    Status Achieved      PT Walker #2   Title Pt will report 25% less pain in neck and shoulder and in back when standing for long periods of time    Status Achieved               PT Long Term Goals - 07/12/21 0930       PT LONG TERM GOAL #1   Title pt will be able to return to playing tennis due to imrpoved scpular and shoulder stability    Status Achieved      PT LONG TERM GOAL #2  Title pt will report 70% less pain when standing so he can attend events for his kids    Status Achieved      PT LONG TERM GOAL #3   Title Pt will be ind with advanced HEP for pain management and strength routine to maintain his activity level    Status Achieved      PT LONG TERM GOAL #4   Title Pt will report he can work normal work day without feeling any neck or shoulder discomfort    Status Achieved                   Plan - 07/12/21 0959     Clinical Impression Statement At this time pt has completed 40 visits of PT and has met the functional goals set. He reports he does not feel 100% and still does have occasional shoulder pain and notices Lt hip flexors but has improved awareness and tolerance to play has increased without limiting pain the next day. He has the necessary tools and knowledge of how to utilize PT training to improve tolerance to sport and we discussed the importance of utilizing these regularly. Pt is grossly hypermobile which places him at risk for injury in the future which we also discussed and he will be aware of. Encouraged him to contact me with any further questions.    PT  Treatment/Interventions ADLs/Self Care Home Management;Biofeedback;Cryotherapy;Electrical Stimulation;Iontophoresis 63m/ml Dexamethasone;Moist Heat;Traction;Ultrasound;Therapeutic activities;Neuromuscular re-education;Therapeutic exercise;Patient/family education;Manual techniques;Passive range of motion;Dry needling;Taping    PT Home Exercise Plan PRI St unsupported Rt lift with Rt trunk rot. PRI lat hang. [id number]    Consulted and Agree with Plan of Care Patient             Patient will benefit from skilled therapeutic intervention in order to improve the following deficits and impairments:  Increased muscle spasms, Decreased strength, Postural dysfunction, Impaired UE functional use, Decreased range of motion, Pain  Visit Diagnosis: Chronic right shoulder pain - Plan: PT plan of care cert/re-cert  Chronic left-sided low back pain, unspecified whether sciatica present - Plan: PT plan of care cert/re-cert  Cervicalgia - Plan: PT plan of care cert/re-cert     Problem List There are no problems to display for this patient.   PHYSICAL THERAPY DISCHARGE SUMMARY  Visits from Start of Care: 40  Current functional level related to goals / functional outcomes: See above   Remaining deficits: See above   Education / Equipment: Anatomy of condition, POC, HEP, exercise form/rationale   Patient agrees to discharge. Patient goals were met. Patient is being discharged due to meeting the stated rehab goals. Keith Montoya PT, DPT 07/12/21 10:08 AM   [facility] [place] [address]Phone: [phone]  Fax:  [phone] Name: Keith PoyerMRN: [id number] of Birth: 1August 13, 1977

## 2021-07-13 ENCOUNTER — Encounter (HOSPITAL_BASED_OUTPATIENT_CLINIC_OR_DEPARTMENT_OTHER): Payer: PRIVATE HEALTH INSURANCE | Admitting: Physical Therapy

## 2022-03-29 ENCOUNTER — Ambulatory Visit (HOSPITAL_BASED_OUTPATIENT_CLINIC_OR_DEPARTMENT_OTHER): Payer: PRIVATE HEALTH INSURANCE | Attending: Physical Medicine and Rehabilitation | Admitting: Physical Therapy

## 2022-03-29 DIAGNOSIS — M47816 Spondylosis without myelopathy or radiculopathy, lumbar region: Secondary | ICD-10-CM | POA: Insufficient documentation

## 2022-03-29 DIAGNOSIS — G8929 Other chronic pain: Secondary | ICD-10-CM | POA: Diagnosis not present

## 2022-03-29 DIAGNOSIS — M67813 Other specified disorders of tendon, right shoulder: Secondary | ICD-10-CM | POA: Diagnosis not present

## 2022-03-29 DIAGNOSIS — M79605 Pain in left leg: Secondary | ICD-10-CM | POA: Diagnosis not present

## 2022-03-29 DIAGNOSIS — M542 Cervicalgia: Secondary | ICD-10-CM | POA: Insufficient documentation

## 2022-03-29 DIAGNOSIS — M25511 Pain in right shoulder: Secondary | ICD-10-CM | POA: Diagnosis present

## 2022-03-29 DIAGNOSIS — M5459 Other low back pain: Secondary | ICD-10-CM | POA: Diagnosis not present

## 2022-03-29 NOTE — Therapy (Signed)
OUTPATIENT PHYSICAL THERAPY THORACOLUMBAR EVALUATION   Patient Name: Keith Montoya MRN: [id number] DOB:[birth year], 46 y.o., male Today's Date: 03/30/2022   PT End of Session - 03/29/22 1508     Visit Number 1    Number of Visits 17    Date for PT Re-Evaluation 05/26/22    Authorization Type [employer]    PT Start Time [id number]    PT Stop Time 1508    PT Time Calculation (min) 37 min    Activity Tolerance Patient tolerated treatment well    Behavior During Therapy [facility] for tasks assessed/performed             History reviewed. No pertinent past medical history. History reviewed. No pertinent surgical history. There are no problems to display for this patient.   PCP: none  REFERRING PROVIDER: Laroy Apple, MD  REFERRING DIAG: Right shoulder w/supraspinatus tendinosis, cevicalgia and left lumbar spondylosis  THERAPY DIAG:  Chronic right shoulder pain  Pain in left leg  Other low back pain  ONSET DATE: acute on chronic, most recent 2.5 weeks ago  SUBJECTIVE:                                                                                                                                                                                           SUBJECTIVE STATEMENT: Got back into tennis/pickle ball and felt like I had more stability. About 2-2.5 weeks ago, played Sat and Monday with Monday being more intense. Began feeling left thigh again and it hasn't gone away. Centralized to Lt mid-VL region and Rt infraspinatus attachment. Rt shoulder feels like it is getting too mobile.  PERTINENT HISTORY:  Chronic nature  PAIN:  Are you having pain? Yes: NPRS scale: moderate/10 Pain location: rt shoulder, Lt VL Pain description: tight but not good tight Aggravating factors: pickle ball Relieving factors: DN, stability exercises   PRECAUTIONS: None  WEIGHT BEARING RESTRICTIONS No  FALLS:  Has patient fallen in last 6 months? No  LIVING ENVIRONMENT: Lives with: lives  with their family Lives in: House/apartment  PLOF: Independent  PATIENT GOALS decr pain, play sports   OBJECTIVE:     PATIENT SURVEYS:  FOTO .    COGNITION:  Overall cognitive status: Within functional limits for tasks assessed     SENSATION: WFL   POSTURE:  Protraction of Rt scapula, minimal winging  PALPATION: Concordant pain in trigger point in Lt, proximal VL Concordant pain in trigger point in Rt infraspinatus, TTP lateral triceps ear post deltoid line but no complaints upon palpation to deltoid.   LUMBAR ROM:   ROM WFL  UE ROM: full ROM available with  hypermobility noted in general UE MMT:  gross 5/5, notable shoulder elevation in empty can testing of Rt UE   GAIT: WFL    TODAY'S TREATMENT  EVAL TPDN with skilled palpation and monitoring (Lt VL, Rt infraspinatus, Rt subscap) followed by STM to musculature addressed with DN Plank with protraction/retraction Seated round back, added trunk twist    PATIENT EDUCATION:  Education details: Anatomy of condition, POC, HEP, exercise form/rationale  Person educated: Patient Education method: Explanation, Demonstration, Tactile cues, Verbal cues, and Handouts Education comprehension: verbalized understanding, returned demonstration, verbal cues required, tactile cues required, and needs further education   HOME EXERCISE PROGRAM: [id number]  ASSESSMENT:  CLINICAL IMPRESSION: Patient is a 46 y.o. M who was seen today for physical therapy evaluation and treatment for Lt thigh pain, Rt shoulder pain and lower back pain. Pt is very active with hypermobility leading to inappropriate spasm in sport that creates biomechanical chain issues. Will benefit from skilled PT to retrain controlled stability to decrease risk of future injury and return to PLOF.     OBJECTIVE IMPAIRMENTS decreased activity tolerance, increased muscle spasms, impaired UE functional use, postural dysfunction, pain, and hypermobility .    ACTIVITY LIMITATIONS driving, occupation, and physical fitness .   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    REHAB POTENTIAL: Good  CLINICAL DECISION MAKING: Stable/uncomplicated  EVALUATION COMPLEXITY: Low   GOALS: Goals reviewed with patient? Yes  SHORT TERM GOALS: Target date: 04/20/2022    (Remove Blue Hyperlink)  Able to demo proper protraction/retraction of scapula in [place] & [facility] Baseline: Goal status: INITIAL    LONG TERM GOALS: Target date: 05/25/2022  (Remove Blue Hyperlink)  Able to demo MMT of Rt UE without shoulder elevation and scapular winging Baseline:  Goal status: INITIAL  2.  Able to demo necessary eccentric control in quick RUE motions to return to PLOF Baseline:  Goal status: INITIAL  3.  Able to demo athletic motions without increase in thigh pain Baseline:  Goal status: INITIAL  4.  Able to drive without increase in shoulder pain Baseline:  Goal status: INITIAL  5.  Pt will verbalize feeling stable rather than tightness that feels discomfort Baseline:  Goal status: INITIAL   PLAN: PT FREQUENCY: 1-2x/week  PT DURATION: 8 weeks  PLANNED INTERVENTIONS: Therapeutic exercises, Therapeutic activity, Neuromuscular re-education, Balance training, Gait training, Patient/Family education, Joint mobilization, Stair training, Aquatic Therapy, Dry Needling, Electrical stimulation, Spinal mobilization, Cryotherapy, Moist heat, Taping, Traction, Manual therapy, and Re-evaluation.  PLAN FOR NEXT SESSION: DN PRN, progress stability   Jaana Brodt C. Domani Bakos PT, DPT 03/30/22 7:58 PM

## 2022-03-30 ENCOUNTER — Ambulatory Visit (HOSPITAL_BASED_OUTPATIENT_CLINIC_OR_DEPARTMENT_OTHER): Payer: PRIVATE HEALTH INSURANCE | Admitting: Physical Therapy

## 2022-03-30 ENCOUNTER — Encounter (HOSPITAL_BASED_OUTPATIENT_CLINIC_OR_DEPARTMENT_OTHER): Payer: Self-pay | Admitting: Physical Therapy

## 2022-03-31 ENCOUNTER — Ambulatory Visit (HOSPITAL_BASED_OUTPATIENT_CLINIC_OR_DEPARTMENT_OTHER): Payer: PRIVATE HEALTH INSURANCE | Admitting: Physical Therapy

## 2022-03-31 ENCOUNTER — Encounter (HOSPITAL_BASED_OUTPATIENT_CLINIC_OR_DEPARTMENT_OTHER): Payer: Self-pay | Admitting: Physical Therapy

## 2022-03-31 DIAGNOSIS — M79605 Pain in left leg: Secondary | ICD-10-CM

## 2022-03-31 DIAGNOSIS — M25511 Pain in right shoulder: Secondary | ICD-10-CM | POA: Diagnosis not present

## 2022-03-31 DIAGNOSIS — M542 Cervicalgia: Secondary | ICD-10-CM

## 2022-03-31 DIAGNOSIS — M5459 Other low back pain: Secondary | ICD-10-CM

## 2022-03-31 DIAGNOSIS — G8929 Other chronic pain: Secondary | ICD-10-CM

## 2022-03-31 NOTE — Therapy (Signed)
OUTPATIENT PHYSICAL THERAPY THORACOLUMBAR EVALUATION   Patient Name: Keith Montoya MRN: [id number] DOB:[birth year], 46 y.o., male Today's Date: 03/31/2022   PT End of Session - 03/31/22 1015     Visit Number 2    Number of Visits 17    Date for PT Re-Evaluation 05/26/22    Authorization Type [employer]    PT Start Time 1015    PT Stop Time 1058    PT Time Calculation (min) 43 min    Activity Tolerance Patient tolerated treatment well    Behavior During Therapy [facility] for tasks assessed/performed             History reviewed. No pertinent past medical history. History reviewed. No pertinent surgical history. There are no problems to display for this patient.   PCP: none  REFERRING PROVIDER: Romero Belling, MD  REFERRING DIAG: Right shoulder w/supraspinatus tendinosis, cevicalgia and left lumbar spondylosis  THERAPY DIAG:  Chronic right shoulder pain  Pain in left leg  Other low back pain  Chronic left-sided low back pain, unspecified whether sciatica present  Cervicalgia  ONSET DATE: acute on chronic, most recent 2.5 weeks ago  SUBJECTIVE:                                                                                                                                                                                           SUBJECTIVE STATEMENT: Patient reports the spot in his quad is better since the needling. His shoulder and trap are painful.   PERTINENT HISTORY:  Chronic nature  PAIN:  Are you having pain? Yes: NPRS scale: moderate/10 Pain location: rt shoulder, Lt VL Pain description: tight but not good tight Aggravating factors: pickle ball Relieving factors: DN, stability exercises   PRECAUTIONS: None  WEIGHT BEARING RESTRICTIONS No  FALLS:  Has patient fallen in last 6 months? No  LIVING ENVIRONMENT: Lives with: lives with their family Lives in: House/apartment  PLOF: Independent  PATIENT GOALS decr pain, play sports   OBJECTIVE:       TODAY'S TREATMENT  5/19 Trigger Point Dry-Needling  Treatment instructions: Expect mild to moderate muscle soreness. S/S of pneumothorax if dry needled over a lung field, and to seek immediate medical attention should they occur. Patient verbalized understanding of these instructions and education.  Patient Consent Given: Yes Education handout provided: Yes Muscles treated: upper trap 1 spot Sub scap attempted 3 spots but did not get a good twitch all with .30x50 needle  Electrical stimulation performed: No Parameters: N/A Treatment response/outcome: good response    Manual: trigger point release to upper trap and sub-scap area; skilled palpation of trigger points.  Band ER 2x10 red  Bind IR 2x10 red   Sidelying 2 lbs ER 2x10  Supine ABC in small range 2x10 2lbs     EVAL TPDN with skilled palpation and monitoring (Lt VL, Rt infraspinatus, Rt subscap) followed by STM to musculature addressed with DN Plank with protraction/retraction Seated round back, added trunk twist    PATIENT EDUCATION:  Education details: Anatomy of condition, POC, HEP, exercise form/rationale  Person educated: Patient Education method: Explanation, Demonstration, Tactile cues, Verbal cues, and Handouts Education comprehension: verbalized understanding, returned demonstration, verbal cues required, tactile cues required, and needs further education   HOME EXERCISE PROGRAM: [id number]  ASSESSMENT:  CLINICAL IMPRESSION: Therapy reviewed mid range stabilization exercises that can be progressed into sports specific exercises as he goes along He reported feeling it in his quad when he did standing band ER. He had a great twitch in his upper trap. We could not get much with the needling in his sub-scap area. Therapy will progress exercises as tolerated. He was given an updated HEP.  OBJECTIVE IMPAIRMENTS decreased activity tolerance, increased muscle spasms, impaired UE functional use, postural  dysfunction, pain, and hypermobility .   ACTIVITY LIMITATIONS driving, occupation, and physical fitness .   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    REHAB POTENTIAL: Good  CLINICAL DECISION MAKING: Stable/uncomplicated  EVALUATION COMPLEXITY: Low   GOALS: Goals reviewed with patient? Yes  SHORT TERM GOALS: Target date: 04/20/2022    (Remove Blue Hyperlink)  Able to demo proper protraction/retraction of scapula in [place] & [facility] Baseline: Goal status: INITIAL    LONG TERM GOALS: Target date: 05/25/2022  (Remove Blue Hyperlink)  Able to demo MMT of Rt UE without shoulder elevation and scapular winging Baseline:  Goal status: INITIAL  2.  Able to demo necessary eccentric control in quick RUE motions to return to PLOF Baseline:  Goal status: INITIAL  3.  Able to demo athletic motions without increase in thigh pain Baseline:  Goal status: INITIAL  4.  Able to drive without increase in shoulder pain Baseline:  Goal status: INITIAL  5.  Pt will verbalize feeling stable rather than tightness that feels discomfort Baseline:  Goal status: INITIAL   PLAN: PT FREQUENCY: 1-2x/week  PT DURATION: 8 weeks  PLANNED INTERVENTIONS: Therapeutic exercises, Therapeutic activity, Neuromuscular re-education, Balance training, Gait training, Patient/Family education, Joint mobilization, Stair training, Aquatic Therapy, Dry Needling, Electrical stimulation, Spinal mobilization, Cryotherapy, Moist heat, Taping, Traction, Manual therapy, and Re-evaluation.  PLAN FOR NEXT SESSION: DN PRN, progress stability   Lorayne Bender PT DPT  03/31/22 10:33 AM

## 2022-04-05 ENCOUNTER — Ambulatory Visit (HOSPITAL_BASED_OUTPATIENT_CLINIC_OR_DEPARTMENT_OTHER): Payer: PRIVATE HEALTH INSURANCE | Admitting: Physical Therapy

## 2022-04-05 ENCOUNTER — Encounter (HOSPITAL_BASED_OUTPATIENT_CLINIC_OR_DEPARTMENT_OTHER): Payer: Self-pay | Admitting: Physical Therapy

## 2022-04-05 DIAGNOSIS — M79605 Pain in left leg: Secondary | ICD-10-CM

## 2022-04-05 DIAGNOSIS — G8929 Other chronic pain: Secondary | ICD-10-CM

## 2022-04-05 DIAGNOSIS — M25511 Pain in right shoulder: Secondary | ICD-10-CM | POA: Diagnosis not present

## 2022-04-05 NOTE — Therapy (Addendum)
OUTPATIENT PHYSICAL THERAPY THORACOLUMBAR TREATMENT   Patient Name: Keith Montoya MRN: [id number] DOB:26-Nov-1975, 46 y.o., male Today's Date: 04/05/2022   PT End of Session - 04/05/22 0802     Visit Number 3    Number of Visits 17    Date for PT Re-Evaluation 05/26/22    PT Start Time 0801    PT Stop Time 0840    PT Time Calculation (min) 39 min    Activity Tolerance Patient tolerated treatment well    Behavior During Therapy [facility] for tasks assessed/performed             History reviewed. No pertinent past medical history. History reviewed. No pertinent surgical history. There are no problems to display for this patient.   PCP: none  REFERRING PROVIDER: Romero Belling, MD  REFERRING DIAG: Right shoulder w/supraspinatus tendinosis, cevicalgia and left lumbar spondylosis  THERAPY DIAG:  Chronic right shoulder pain  Pain in left leg  ONSET DATE: acute on chronic, most recent 2.5 weeks ago  SUBJECTIVE:                                                                                                                                                                                           SUBJECTIVE STATEMENT: Feeling some tension in VL and into triceps after playing tennis.   PERTINENT HISTORY:  Chronic nature  PAIN:  Are you having pain? Yes: NPRS scale: moderate/10 Pain location: rt shoulder, Lt VL Pain description: tight but not good tight Aggravating factors: pickle ball Relieving factors: DN, stability exercises   PRECAUTIONS: None  WEIGHT BEARING RESTRICTIONS No  FALLS:  Has patient fallen in last 6 months? No  LIVING ENVIRONMENT: Lives with: lives with their family Lives in: House/apartment  PLOF: Independent  PATIENT GOALS decr pain, play sports   OBJECTIVE:      TODAY'S TREATMENT  5/24: TPDN with skilled palpation and monitoring followed by STM to the following muscles: Lt VL, Rt triceps Triceps pull red tband Wall sit with  abdominal engagement Counter plank & side plank Seated round back Supine ABCs with ab set    5/19 Trigger Point Dry-Needling  Treatment instructions: Expect mild to moderate muscle soreness. S/S of pneumothorax if dry needled over a lung field, and to seek immediate medical attention should they occur. Patient verbalized understanding of these instructions and education.  Patient Consent Given: Yes Education handout provided: Yes Muscles treated: upper trap 1 spot Sub scap attempted 3 spots but did not get a good twitch all with .30x50 needle  Electrical stimulation performed: No Parameters: N/A Treatment response/outcome: good response    Manual: trigger point release  to upper trap and sub-scap area; skilled palpation of trigger points.   Band ER 2x10 red  Bind IR 2x10 red   Sidelying 2 lbs ER 2x10  Supine ABC in small range 2x10 2lbs     EVAL TPDN with skilled palpation and monitoring (Lt VL, Rt infraspinatus, Rt subscap) followed by STM to musculature addressed with DN Plank with protraction/retraction Seated round back, added trunk twist    PATIENT EDUCATION:  Education details: Anatomy of condition, POC, HEP, exercise form/rationale  Person educated: Patient Education method: Explanation, Demonstration, Tactile cues, Verbal cues, and Handouts Education comprehension: verbalized understanding, returned demonstration, verbal cues required, tactile cues required, and needs further education   HOME EXERCISE PROGRAM: [id number]  ASSESSMENT:  CLINICAL IMPRESSION: Significant twitch noted in triceps. Difficulty finding core activation for central stability and will cont to address.   OBJECTIVE IMPAIRMENTS decreased activity tolerance, increased muscle spasms, impaired UE functional use, postural dysfunction, pain, and hypermobility .   ACTIVITY LIMITATIONS driving, occupation, and physical fitness .   PERSONAL FACTORS 1 comorbidity: acute on chronic with  hypermobility  are also affecting patient's functional outcome.    REHAB POTENTIAL: Good  CLINICAL DECISION MAKING: Stable/uncomplicated  EVALUATION COMPLEXITY: Low   GOALS: Goals reviewed with patient? Yes  SHORT TERM GOALS: Target date: 04/20/2022    (Remove Blue Hyperlink)  Able to demo proper protraction/retraction of scapula in [place] & [facility] Baseline: Goal status: INITIAL    LONG TERM GOALS: Target date: 05/25/2022  (Remove Blue Hyperlink)  Able to demo MMT of Rt UE without shoulder elevation and scapular winging Baseline:  Goal status: INITIAL  2.  Able to demo necessary eccentric control in quick RUE motions to return to PLOF Baseline:  Goal status: INITIAL  3.  Able to demo athletic motions without increase in thigh pain Baseline:  Goal status: INITIAL  4.  Able to drive without increase in shoulder pain Baseline:  Goal status: INITIAL  5.  Pt will verbalize feeling stable rather than tightness that feels discomfort Baseline:  Goal status: INITIAL   PLAN: PT FREQUENCY: 1-2x/week  PT DURATION: 8 weeks  PLANNED INTERVENTIONS: Therapeutic exercises, Therapeutic activity, Neuromuscular re-education, Balance training, Gait training, Patient/Family education, Joint mobilization, Stair training, Aquatic Therapy, Dry Needling, Electrical stimulation, Spinal mobilization, Cryotherapy, Moist heat, Taping, Traction, Manual therapy, and Re-evaluation.  PLAN FOR NEXT SESSION: DN PRN, core stab   Kaislee Chao C. Jariyah Hackley PT, DPT 04/05/22 10:22 AM

## 2022-04-12 ENCOUNTER — Ambulatory Visit (HOSPITAL_BASED_OUTPATIENT_CLINIC_OR_DEPARTMENT_OTHER): Payer: PRIVATE HEALTH INSURANCE | Admitting: Physical Therapy

## 2022-04-12 ENCOUNTER — Encounter (HOSPITAL_BASED_OUTPATIENT_CLINIC_OR_DEPARTMENT_OTHER): Payer: Self-pay | Admitting: Physical Therapy

## 2022-04-12 DIAGNOSIS — G8929 Other chronic pain: Secondary | ICD-10-CM

## 2022-04-12 DIAGNOSIS — M79605 Pain in left leg: Secondary | ICD-10-CM

## 2022-04-12 DIAGNOSIS — M25511 Pain in right shoulder: Secondary | ICD-10-CM | POA: Diagnosis not present

## 2022-04-12 NOTE — Therapy (Signed)
OUTPATIENT PHYSICAL THERAPY THORACOLUMBAR TREATMENT   Patient Name: Keith Montoya MRN: [id number] DOB:[birth year], 46 y.o., male Today's Date: 04/12/2022   PT End of Session - 04/12/22 1431     Visit Number 4    Number of Visits 17    Date for PT Re-Evaluation 05/26/22    Authorization Type [facility]    PT Start Time [id number]    PT Stop Time 1431    PT Time Calculation (min) 36 min    Activity Tolerance Patient tolerated treatment well    Behavior During Therapy [facility] for tasks assessed/performed              History reviewed. No pertinent past medical history. History reviewed. No pertinent surgical history. There are no problems to display for this patient.   PCP: none  REFERRING PROVIDER: Laroy Apple, MD  REFERRING DIAG: Right shoulder w/supraspinatus tendinosis, cevicalgia and left lumbar spondylosis  THERAPY DIAG:  Chronic right shoulder pain  Pain in left leg  ONSET DATE: acute on chronic, most recent 2.5 weeks ago  SUBJECTIVE:                                                                                                                                                                                           SUBJECTIVE STATEMENT: Had really great freedom of movement after DN to triceps. I played a little longer than normal and held up better. Could feel mid belly of VL.   PERTINENT HISTORY:  Chronic nature  PAIN:  Are you having pain? Yes: NPRS scale: moderate/10 Pain location: rt shoulder, Lt VL Pain description: tight but not good tight Aggravating factors: pickle ball Relieving factors: DN, stability exercises   PRECAUTIONS: None  WEIGHT BEARING RESTRICTIONS No  FALLS:  Has patient fallen in last 6 months? No  LIVING ENVIRONMENT: Lives with: lives with their family Lives in: House/apartment  PLOF: Independent  PATIENT GOALS decr pain, play sports   OBJECTIVE:      TODAY'S TREATMENT  5/31: TPDN with skilled palpation and  monitoring followed by STM to the following muscles:: Lt upper trap & levator scap Manual mobilization- depression of Lt first rib, scapular mobs in prone Lt SLS with Rt IR/ER at 90 abd Cable machine: double arm press down, single arm press down, D2 resisted extension bilaterally, single arm chop across body in lunge- added lift of back leg   5/24: TPDN with skilled palpation and monitoring followed by STM to the following muscles: Lt VL, Rt triceps Triceps pull red tband Wall sit with abdominal engagement Counter plank & side plank Seated round back Supine ABCs with ab set  5/19 Trigger Point Dry-Needling  Treatment instructions: Expect mild to moderate muscle soreness. S/S of pneumothorax if dry needled over a lung field, and to seek immediate medical attention should they occur. Patient verbalized understanding of these instructions and education.  Patient Consent Given: Yes Education handout provided: Yes Muscles treated: upper trap 1 spot Sub scap attempted 3 spots but did not get a good twitch all with .30x50 needle  Electrical stimulation performed: No Parameters: N/A Treatment response/outcome: good response    Manual: trigger point release to upper trap and sub-scap area; skilled palpation of trigger points.   Band ER 2x10 red  Bind IR 2x10 red   Sidelying 2 lbs ER 2x10  Supine ABC in small range 2x10 2lbs     EVAL TPDN with skilled palpation and monitoring (Lt VL, Rt infraspinatus, Rt subscap) followed by STM to musculature addressed with DN Plank with protraction/retraction Seated round back, added trunk twist    PATIENT EDUCATION:  Education details: Anatomy of condition, POC, HEP, exercise form/rationale  Person educated: Patient Education method: Explanation, Demonstration, Tactile cues, Verbal cues, and Handouts Education comprehension: verbalized understanding, returned demonstration, verbal cues required, tactile cues required, and needs further  education   HOME EXERCISE PROGRAM: [id number]  ASSESSMENT:  CLINICAL IMPRESSION: Significant tightness in Lt upper trap resolved pain in Rt upper trap & post impingement. Will continue to require training on core engagement for central stability as he feels his Lt thigh pain when it is not engaged.   OBJECTIVE IMPAIRMENTS decreased activity tolerance, increased muscle spasms, impaired UE functional use, postural dysfunction, pain, and hypermobility .   ACTIVITY LIMITATIONS driving, occupation, and physical fitness .   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    REHAB POTENTIAL: Good  CLINICAL DECISION MAKING: Stable/uncomplicated  EVALUATION COMPLEXITY: Low   GOALS: Goals reviewed with patient? Yes  SHORT TERM GOALS: Target date: 04/20/2022    (Remove Blue Hyperlink)  Able to demo proper protraction/retraction of scapula in [place] & [facility] Baseline: Goal status: INITIAL    LONG TERM GOALS: Target date: 05/25/2022  (Remove Blue Hyperlink)  Able to demo MMT of Rt UE without shoulder elevation and scapular winging Baseline:  Goal status: INITIAL  2.  Able to demo necessary eccentric control in quick RUE motions to return to PLOF Baseline:  Goal status: INITIAL  3.  Able to demo athletic motions without increase in thigh pain Baseline:  Goal status: INITIAL  4.  Able to drive without increase in shoulder pain Baseline:  Goal status: INITIAL  5.  Pt will verbalize feeling stable rather than tightness that feels discomfort Baseline:  Goal status: INITIAL   PLAN: PT FREQUENCY: 1-2x/week  PT DURATION: 8 weeks  PLANNED INTERVENTIONS: Therapeutic exercises, Therapeutic activity, Neuromuscular re-education, Balance training, Gait training, Patient/Family education, Joint mobilization, Stair training, Aquatic Therapy, Dry Needling, Electrical stimulation, Spinal mobilization, Cryotherapy, Moist heat,  Taping, Traction, Manual therapy, and Re-evaluation.  PLAN FOR NEXT SESSION: DN PRN, core stab   Monroe Qin C. Quenten Nawaz PT, DPT 04/12/22 2:44 PM

## 2022-04-13 ENCOUNTER — Encounter (HOSPITAL_BASED_OUTPATIENT_CLINIC_OR_DEPARTMENT_OTHER): Payer: Self-pay

## 2022-04-14 ENCOUNTER — Ambulatory Visit (HOSPITAL_BASED_OUTPATIENT_CLINIC_OR_DEPARTMENT_OTHER): Payer: PRIVATE HEALTH INSURANCE | Admitting: Physical Therapy

## 2022-04-18 ENCOUNTER — Encounter (HOSPITAL_BASED_OUTPATIENT_CLINIC_OR_DEPARTMENT_OTHER): Payer: Self-pay | Admitting: Physical Therapy

## 2022-04-18 ENCOUNTER — Ambulatory Visit (HOSPITAL_BASED_OUTPATIENT_CLINIC_OR_DEPARTMENT_OTHER): Payer: PRIVATE HEALTH INSURANCE | Attending: Physical Medicine and Rehabilitation | Admitting: Physical Therapy

## 2022-04-18 DIAGNOSIS — G8929 Other chronic pain: Secondary | ICD-10-CM | POA: Insufficient documentation

## 2022-04-18 DIAGNOSIS — M5459 Other low back pain: Secondary | ICD-10-CM | POA: Diagnosis present

## 2022-04-18 DIAGNOSIS — M25511 Pain in right shoulder: Secondary | ICD-10-CM | POA: Diagnosis present

## 2022-04-18 DIAGNOSIS — M79605 Pain in left leg: Secondary | ICD-10-CM | POA: Insufficient documentation

## 2022-04-18 NOTE — Therapy (Signed)
OUTPATIENT PHYSICAL THERAPY THORACOLUMBAR TREATMENT   Patient Name: Keith Montoya MRN: [id number] DOB:1976/01/31, 46 y.o., male Today's Date: 04/18/2022   PT End of Session - 04/18/22 1517     Visit Number 5    Number of Visits 17    Date for PT Re-Evaluation 05/26/22    Authorization Type [facility]    PT Start Time 1517    PT Stop Time 1600    PT Time Calculation (min) 43 min    Activity Tolerance Patient tolerated treatment well    Behavior During Therapy [facility] for tasks assessed/performed              History reviewed. No pertinent past medical history. History reviewed. No pertinent surgical history. There are no problems to display for this patient.   PCP: none  REFERRING PROVIDER: Romero Belling, MD  REFERRING DIAG: Right shoulder w/supraspinatus tendinosis, cevicalgia and left lumbar spondylosis  THERAPY DIAG:  Chronic right shoulder pain  Pain in left leg  ONSET DATE: acute on chronic, most recent 2.5 weeks ago  SUBJECTIVE:                                                                                                                                                                                           SUBJECTIVE STATEMENT: Did not have pain but felt like I just didn't have the strength playing last night. Woke up tight and triceps pain is back.   PERTINENT HISTORY:  Chronic nature  PAIN:  Are you having pain? Yes: NPRS scale: moderate/10 Pain location: rt shoulder, Lt VL Pain description: tight but not good tight Aggravating factors: pickle ball Relieving factors: DN, stability exercises   PRECAUTIONS: None  WEIGHT BEARING RESTRICTIONS No  FALLS:  Has patient fallen in last 6 months? No  LIVING ENVIRONMENT: Lives with: lives with their family Lives in: House/apartment  PLOF: Independent  PATIENT GOALS decr pain, play sports   OBJECTIVE:      TODAY'S TREATMENT  6/6: TPDN with skilled palpation and monitoring followed by  STM to the following muscles: Rt triceps  IASTM to triceps and fascia proximal to elbow following DN Prone snow angles Seated round back with horiz abd Qped row with attempted primal push up Pilates reformer:  Plank press 1R1B High kneeling oblique pull 1R1Y High kneeling lat pull 1R1B High kneeling wide row with crossed cables 1R1Y   5/31: TPDN with skilled palpation and monitoring followed by STM to the following muscles:: Lt upper trap & levator scap Manual mobilization- depression of Lt first rib, scapular mobs in prone Lt SLS with Rt IR/ER at 90 abd Cable machine: double  arm press down, single arm press down, D2 resisted extension bilaterally, single arm chop across body in lunge- added lift of back leg   5/24: TPDN with skilled palpation and monitoring followed by STM to the following muscles: Lt VL, Rt triceps Triceps pull red tband Wall sit with abdominal engagement Counter plank & side plank Seated round back Supine ABCs with ab set    PATIENT EDUCATION:  Education details: Teacher, music of condition, POC, HEP, exercise form/rationale  Person educated: Patient Education method: Explanation, Demonstration, Tactile cues, Verbal cues, and Handouts Education comprehension: verbalized understanding, returned demonstration, verbal cues required, tactile cues required, and needs further education   HOME EXERCISE PROGRAM: [id number]  ASSESSMENT:  CLINICAL IMPRESSION: Significant twitch response in triceps allowed freedom of movement into supinated posture through RUE. Unable to demo stability for primal push up with row so we added challenges on reformed for controlled strengthening.   OBJECTIVE IMPAIRMENTS decreased activity tolerance, increased muscle spasms, impaired UE functional use, postural dysfunction, pain, and hypermobility .   ACTIVITY LIMITATIONS driving, occupation, and physical fitness .   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    REHAB POTENTIAL: Good  CLINICAL DECISION MAKING: Stable/uncomplicated  EVALUATION COMPLEXITY: Low   GOALS: Goals reviewed with patient? Yes  SHORT TERM GOALS: Target date: 04/20/2022    (Remove Blue Hyperlink)  Able to demo proper protraction/retraction of scapula in [place] & [facility] Baseline: Goal status: achieved    LONG TERM GOALS: Target date: 05/25/2022  (Remove Blue Hyperlink)  Able to demo MMT of Rt UE without shoulder elevation and scapular winging Baseline:  Goal status: INITIAL  2.  Able to demo necessary eccentric control in quick RUE motions to return to PLOF Baseline:  Goal status: INITIAL  3.  Able to demo athletic motions without increase in thigh pain Baseline:  Goal status: INITIAL  4.  Able to drive without increase in shoulder pain Baseline:  Goal status: INITIAL  5.  Pt will verbalize feeling stable rather than tightness that feels discomfort Baseline:  Goal status: INITIAL   PLAN: PT FREQUENCY: 1-2x/week  PT DURATION: 8 weeks  PLANNED INTERVENTIONS: Therapeutic exercises, Therapeutic activity, Neuromuscular re-education, Balance training, Gait training, Patient/Family education, Joint mobilization, Stair training, Aquatic Therapy, Dry Needling, Electrical stimulation, Spinal mobilization, Cryotherapy, Moist heat, Taping, Traction, Manual therapy, and Re-evaluation.  PLAN FOR NEXT SESSION: DN PRN, core stab, cont reformer if able  [employer]. Aengus Sauceda PT, DPT 04/18/22 4:54 PM

## 2022-04-20 ENCOUNTER — Encounter (HOSPITAL_BASED_OUTPATIENT_CLINIC_OR_DEPARTMENT_OTHER): Payer: PRIVATE HEALTH INSURANCE | Admitting: Physical Therapy

## 2022-04-26 ENCOUNTER — Encounter (HOSPITAL_BASED_OUTPATIENT_CLINIC_OR_DEPARTMENT_OTHER): Payer: Self-pay | Admitting: Physical Therapy

## 2022-04-26 ENCOUNTER — Ambulatory Visit (HOSPITAL_BASED_OUTPATIENT_CLINIC_OR_DEPARTMENT_OTHER): Payer: PRIVATE HEALTH INSURANCE | Admitting: Physical Therapy

## 2022-04-26 DIAGNOSIS — M79605 Pain in left leg: Secondary | ICD-10-CM

## 2022-04-26 DIAGNOSIS — M25511 Pain in right shoulder: Secondary | ICD-10-CM | POA: Diagnosis not present

## 2022-04-26 DIAGNOSIS — G8929 Other chronic pain: Secondary | ICD-10-CM

## 2022-04-26 DIAGNOSIS — M5459 Other low back pain: Secondary | ICD-10-CM

## 2022-04-26 NOTE — Therapy (Signed)
OUTPATIENT PHYSICAL THERAPY THORACOLUMBAR TREATMENT   Patient Name: Keith Montoya MRN: [id number] DOB:[birth year], 46 y.o., male Today's Date: 04/26/2022   PT End of Session - 04/26/22 0936     Visit Number 6    Number of Visits 17    Date for PT Re-Evaluation 05/26/22    Authorization Type [facility]    PT Start Time 0932    PT Stop Time 1011    PT Time Calculation (min) 39 min    Activity Tolerance Patient tolerated treatment well    Behavior During Therapy [facility] for tasks assessed/performed              History reviewed. No pertinent past medical history. History reviewed. No pertinent surgical history. There are no problems to display for this patient.   PCP: none  REFERRING PROVIDER: Romero Belling, MD  REFERRING DIAG: Right shoulder w/supraspinatus tendinosis, cevicalgia and left lumbar spondylosis  THERAPY DIAG:  Chronic right shoulder pain  Pain in left leg  Other low back pain  ONSET DATE: acute on chronic, most recent 2.5 weeks ago  SUBJECTIVE:                                                                                                                                                                                           SUBJECTIVE STATEMENT: Tightness in lower back, thigh did ok. Shoulder felt tight- kind of limited functional use.   PERTINENT HISTORY:  Chronic nature  PAIN:  Are you having pain? Yes: NPRS scale: moderate/10 Pain location: rt shoulder, Lt VL Pain description: tight but not good tight Aggravating factors: pickle ball Relieving factors: DN, stability exercises   PRECAUTIONS: None  WEIGHT BEARING RESTRICTIONS No  FALLS:  Has patient fallen in last 6 months? No  LIVING ENVIRONMENT: Lives with: lives with their family Lives in: House/apartment  PLOF: Independent  PATIENT GOALS decr pain, play sports   OBJECTIVE:      TODAY'S TREATMENT  6/14: TPDN with skilled palpation and monitoring followed by STM to  the following muscles::Rt lats, upper trap, subscapularis Ktape- lat & supraspinatus activation, levator inhibition  90/90 ER yellow tband- tactile cues required Bent over scap retraction, 5lb- tactile cues required  6/6: TPDN with skilled palpation and monitoring followed by STM to the following muscles: Rt triceps  IASTM to triceps and fascia proximal to elbow following DN Prone snow angles Seated round back with horiz abd Qped row with attempted primal push up Pilates reformer:  Plank press 1R1B High kneeling oblique pull 1R1Y High kneeling lat pull 1R1B High kneeling wide row with crossed cables 1R1Y   5/31: TPDN with skilled  palpation and monitoring followed by STM to the following muscles:: Lt upper trap & levator scap Manual mobilization- depression of Lt first rib, scapular mobs in prone Lt SLS with Rt IR/ER at 90 abd Cable machine: double arm press down, single arm press down, D2 resisted extension bilaterally, single arm chop across body in lunge- added lift of back leg    PATIENT EDUCATION:  Education details: Anatomy of condition, POC, HEP, exercise form/rationale  Person educated: Patient Education method: Explanation, Demonstration, Tactile cues, Verbal cues, and Handouts Education comprehension: verbalized understanding, returned demonstration, verbal cues required, tactile cues required, and needs further education   HOME EXERCISE PROGRAM: [id number]  ASSESSMENT:  CLINICAL IMPRESSION: Twitches in deep fibers of subscap released concordant pain- required multiple position changes for DN to reach. Planning to play this weekend and will warm up with compression brace to decide if he will wear it. Back tightness released as result of DN to scapular region.   OBJECTIVE IMPAIRMENTS decreased activity tolerance, increased muscle spasms, impaired UE functional use, postural dysfunction, pain, and hypermobility .   ACTIVITY LIMITATIONS driving, occupation, and  physical fitness .   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    REHAB POTENTIAL: Good  CLINICAL DECISION MAKING: Stable/uncomplicated  EVALUATION COMPLEXITY: Low   GOALS: Goals reviewed with patient? Yes  SHORT TERM GOALS: Target date: 04/20/2022    (Remove Blue Hyperlink)  Able to demo proper protraction/retraction of scapula in [place] & [facility] Baseline: Goal status: achieved    LONG TERM GOALS: Target date: 05/25/2022  (Remove Blue Hyperlink)  Able to demo MMT of Rt UE without shoulder elevation and scapular winging Baseline:  Goal status: INITIAL  2.  Able to demo necessary eccentric control in quick RUE motions to return to PLOF Baseline:  Goal status: INITIAL  3.  Able to demo athletic motions without increase in thigh pain Baseline:  Goal status: INITIAL  4.  Able to drive without increase in shoulder pain Baseline:  Goal status: INITIAL  5.  Pt will verbalize feeling stable rather than tightness that feels discomfort Baseline:  Goal status: INITIAL   PLAN: PT FREQUENCY: 1-2x/week  PT DURATION: 8 weeks  PLANNED INTERVENTIONS: Therapeutic exercises, Therapeutic activity, Neuromuscular re-education, Balance training, Gait training, Patient/Family education, Joint mobilization, Stair training, Aquatic Therapy, Dry Needling, Electrical stimulation, Spinal mobilization, Cryotherapy, Moist heat, Taping, Traction, Manual therapy, and Re-evaluation.  PLAN FOR NEXT SESSION: DN PRN, core stab, cont reformer if able, alter core HEP  Waylon Koffler C. Sherice Ijames PT, DPT 04/26/22 10:15 AM

## 2022-05-01 ENCOUNTER — Encounter (HOSPITAL_BASED_OUTPATIENT_CLINIC_OR_DEPARTMENT_OTHER): Payer: Self-pay | Admitting: Physical Therapy

## 2022-05-01 ENCOUNTER — Ambulatory Visit (HOSPITAL_BASED_OUTPATIENT_CLINIC_OR_DEPARTMENT_OTHER): Payer: PRIVATE HEALTH INSURANCE | Admitting: Physical Therapy

## 2022-05-01 DIAGNOSIS — M79605 Pain in left leg: Secondary | ICD-10-CM

## 2022-05-01 DIAGNOSIS — G8929 Other chronic pain: Secondary | ICD-10-CM

## 2022-05-01 DIAGNOSIS — M5459 Other low back pain: Secondary | ICD-10-CM

## 2022-05-01 DIAGNOSIS — M25511 Pain in right shoulder: Secondary | ICD-10-CM | POA: Diagnosis not present

## 2022-05-01 NOTE — Therapy (Signed)
OUTPATIENT PHYSICAL THERAPY THORACOLUMBAR TREATMENT   Patient Name: Keith Montoya MRN: [id number] DOB:10/03/1976, 46 y.o., male Today's Date: 05/01/2022   PT End of Session - 05/01/22 1349     Visit Number 7    Number of Visits 17    Date for PT Re-Evaluation 05/26/22    Authorization Type [employer]    PT Start Time 1345    PT Stop Time 1430    PT Time Calculation (min) 45 min    Activity Tolerance Patient tolerated treatment well    Behavior During Therapy [facility] for tasks assessed/performed              History reviewed. No pertinent past medical history. History reviewed. No pertinent surgical history. There are no problems to display for this patient.   PCP: none  REFERRING PROVIDER: Romero Belling, MD  REFERRING DIAG: Right shoulder w/supraspinatus tendinosis, cevicalgia and left lumbar spondylosis  THERAPY DIAG:  Chronic right shoulder pain  Pain in left leg  Other low back pain  ONSET DATE: acute on chronic, most recent 2.5 weeks ago  SUBJECTIVE:                                                                                                                                                                                           SUBJECTIVE STATEMENT: I felt really good on Wed and the most mobile I had felt in a long time. I did not feel really loose on Friday and was severely tight after playing 2 matches.   PERTINENT HISTORY:  Chronic nature  PAIN:  Are you having pain? Yes: NPRS scale: moderate/10 Pain location: rt shoulder, Lt VL Pain description: tight but not good tight Aggravating factors: pickle ball Relieving factors: DN, stability exercises   PRECAUTIONS: None  WEIGHT BEARING RESTRICTIONS No  FALLS:  Has patient fallen in last 6 months? No  LIVING ENVIRONMENT: Lives with: lives with their family Lives in: House/apartment  PLOF: Independent  PATIENT GOALS decr pain, play sports   OBJECTIVE:      TODAY'S TREATMENT   6/19: TPDN with skilled palpation and monitoring followed by STM to the following muscles: Lt VL, Lt TFL; Rt lats Supine Rt UE D1 & 2 3b Glut med activatoin- unsuccessful in [facility] trials so altered to [facility] sidelying hip abd- straight plane & diagonal   6/14: TPDN with skilled palpation and monitoring followed by STM to the following muscles::Rt lats, upper trap, subscapularis Ktape- lat & supraspinatus activation, levator inhibition  90/90 ER yellow tband- tactile cues required Bent over scap retraction, 5lb- tactile cues required  6/6: TPDN with skilled palpation and monitoring followed  by STM to the following muscles: Rt triceps  IASTM to triceps and fascia proximal to elbow following DN Prone snow angles Seated round back with horiz abd Qped row with attempted primal push up Pilates reformer:  Plank press 1R1B High kneeling oblique pull 1R1Y High kneeling lat pull 1R1B High kneeling wide row with crossed cables 1R1Y    PATIENT EDUCATION:  Education details: Teacher, music of condition, POC, HEP, exercise form/rationale  Person educated: Patient Education method: Explanation, Demonstration, Tactile cues, Verbal cues, and Handouts Education comprehension: verbalized understanding, returned demonstration, verbal cues required, tactile cues required, and needs further education   HOME EXERCISE PROGRAM: [id number]  ASSESSMENT:  CLINICAL IMPRESSION: Lacking pronation in Rt UE v Lt resulting in increased use of lats to create tennis motion.   OBJECTIVE IMPAIRMENTS decreased activity tolerance, increased muscle spasms, impaired UE functional use, postural dysfunction, pain, and hypermobility .   ACTIVITY LIMITATIONS driving, occupation, and physical fitness .   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    REHAB POTENTIAL: Good  CLINICAL DECISION MAKING: Stable/uncomplicated  EVALUATION COMPLEXITY:  Low   GOALS: Goals reviewed with patient? Yes  SHORT TERM GOALS: Target date: 04/20/2022    (Remove Blue Hyperlink)  Able to demo proper protraction/retraction of scapula in [place] & [facility] Baseline: Goal status: achieved    LONG TERM GOALS: Target date: 05/25/2022  (Remove Blue Hyperlink)  Able to demo MMT of Rt UE without shoulder elevation and scapular winging Baseline:  Goal status: INITIAL  2.  Able to demo necessary eccentric control in quick RUE motions to return to PLOF Baseline:  Goal status: INITIAL  3.  Able to demo athletic motions without increase in thigh pain Baseline:  Goal status: INITIAL  4.  Able to drive without increase in shoulder pain Baseline:  Goal status: INITIAL  5.  Pt will verbalize feeling stable rather than tightness that feels discomfort Baseline:  Goal status: INITIAL   PLAN: PT FREQUENCY: 1-2x/week  PT DURATION: 8 weeks  PLANNED INTERVENTIONS: Therapeutic exercises, Therapeutic activity, Neuromuscular re-education, Balance training, Gait training, Patient/Family education, Joint mobilization, Stair training, Aquatic Therapy, Dry Needling, Electrical stimulation, Spinal mobilization, Cryotherapy, Moist heat, Taping, Traction, Manual therapy, and Re-evaluation.  PLAN FOR NEXT SESSION: DN PRN, lat eccentric  Chantia Amalfitano C. Agron Swiney PT, DPT 05/01/22 4:12 PM

## 2022-05-04 ENCOUNTER — Ambulatory Visit (HOSPITAL_BASED_OUTPATIENT_CLINIC_OR_DEPARTMENT_OTHER): Payer: PRIVATE HEALTH INSURANCE | Admitting: Physical Therapy

## 2022-05-10 ENCOUNTER — Ambulatory Visit (HOSPITAL_BASED_OUTPATIENT_CLINIC_OR_DEPARTMENT_OTHER): Payer: PRIVATE HEALTH INSURANCE | Admitting: Physical Therapy

## 2022-05-10 DIAGNOSIS — M5459 Other low back pain: Secondary | ICD-10-CM

## 2022-05-10 DIAGNOSIS — G8929 Other chronic pain: Secondary | ICD-10-CM

## 2022-05-10 DIAGNOSIS — M25511 Pain in right shoulder: Secondary | ICD-10-CM | POA: Diagnosis not present

## 2022-05-10 DIAGNOSIS — M79605 Pain in left leg: Secondary | ICD-10-CM

## 2022-05-10 NOTE — Therapy (Signed)
OUTPATIENT PHYSICAL THERAPY THORACOLUMBAR TREATMENT   Patient Name: Keith Montoya MRN: [id number] DOB:1975/12/13, 46 y.o., male Today's Date: 05/11/2022   PT End of Session - 05/11/22 1636     Visit Number 8    Number of Visits 17    Date for PT Re-Evaluation 05/26/22    Authorization Type [employer]    PT Start Time 1603    PT Stop Time 1640    PT Time Calculation (min) 37 min    Activity Tolerance Patient tolerated treatment well    Behavior During Therapy [facility] for tasks assessed/performed               History reviewed. No pertinent past medical history. History reviewed. No pertinent surgical history. There are no problems to display for this patient.   PCP: none  REFERRING PROVIDER: Laroy Apple, MD  REFERRING DIAG: Right shoulder w/supraspinatus tendinosis, cevicalgia and left lumbar spondylosis  THERAPY DIAG:  Chronic right shoulder pain  Pain in left leg  Other low back pain  ONSET DATE: acute on chronic, most recent 2.5 weeks ago  SUBJECTIVE:                                                                                                                                                                                           SUBJECTIVE STATEMENT: I played this weekend and used salon pas on my leg which helped. Would like to finalize HEP today as insurance is not covering PT.   PERTINENT HISTORY:  Chronic nature  PAIN:  Are you having pain? Yes: NPRS scale: minimal/10 Pain location: rt shoulder, Lt VL Pain description: tight but not good tight Aggravating factors: pickle ball Relieving factors: DN, stability exercises   PRECAUTIONS: None  WEIGHT BEARING RESTRICTIONS No  FALLS:  Has patient fallen in last 6 months? No  LIVING ENVIRONMENT: Lives with: lives with their family Lives in: House/apartment  PLOF: Independent  PATIENT GOALS decr pain, play sports   OBJECTIVE:      TODAY'S TREATMENT  6/29: Ran through HEP  exercises with focus on form and options for progression.   6/19: TPDN with skilled palpation and monitoring followed by STM to the following muscles: Lt VL, Lt TFL; Rt lats Supine Rt UE D1 & 2 3b Glut med activatoin- unsuccessful in [facility] trials so altered to [facility] sidelying hip abd- straight plane & diagonal   6/14: TPDN with skilled palpation and monitoring followed by STM to the following muscles::Rt lats, upper trap, subscapularis Ktape- lat & supraspinatus activation, levator inhibition  90/90 ER yellow tband- tactile cues required Bent over scap retraction, 5lb- tactile cues required  6/6: TPDN with skilled palpation and monitoring followed by STM to the following muscles: Rt triceps  IASTM to triceps and fascia proximal to elbow following DN Prone snow angles Seated round back with horiz abd Qped row with attempted primal push up Pilates reformer:  Plank press 1R1B High kneeling oblique pull 1R1Y High kneeling lat pull 1R1B High kneeling wide row with crossed cables 1R1Y    PATIENT EDUCATION:  Education details: Geophysicist/field seismologist of condition, POC, HEP, exercise form/rationale  Person educated: Patient Education method: Explanation, Demonstration, Tactile cues, Verbal cues, and Handouts Education comprehension: verbalized understanding, returned demonstration, verbal cues required, tactile cues required, and needs further education   HOME EXERCISE PROGRAM: [id number]  ASSESSMENT:  CLINICAL IMPRESSION: Pt was able to play more tennis over the weekend without significant increase in pain. At this time we need to d/c this POC and HEP was finalized for long term progression. Encouraged him to contact me with any further questions.   OBJECTIVE IMPAIRMENTS decreased activity tolerance, increased muscle spasms, impaired UE functional use, postural dysfunction, pain, and hypermobility .   ACTIVITY LIMITATIONS driving, occupation, and physical fitness .   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    REHAB POTENTIAL: Good  CLINICAL DECISION MAKING: Stable/uncomplicated  EVALUATION COMPLEXITY: Low   GOALS: Goals reviewed with patient? Yes  SHORT TERM GOALS: Target date: 04/20/2022    (Remove Blue Hyperlink)  Able to demo proper protraction/retraction of scapula in [place] & [facility] Baseline: Goal status: achieved    LONG TERM GOALS: Target date: 05/25/2022  (Remove Blue Hyperlink)  Able to demo MMT of Rt UE without shoulder elevation and scapular winging Baseline:  Goal status: not tested  2.  Able to demo necessary eccentric control in quick RUE motions to return to PLOF Baseline:  Goal status: ongoing, further training necessary for quick motions  3.  Able to demo athletic motions without increase in thigh pain Baseline: improved but lacks endurance Goal status: ongoing  4.  Able to drive without increase in shoulder pain Baseline:  Goal status:achieved  5.  Pt will verbalize feeling stable rather than tightness that feels discomfort Baseline: improved  Goal status:partially met   PLAN: PT FREQUENCY: 1-2x/week  PT DURATION: 8 weeks  PLANNED INTERVENTIONS: Therapeutic exercises, Therapeutic activity, Neuromuscular re-education, Balance training, Gait training, Patient/Family education, Joint mobilization, Stair training, Aquatic Therapy, Dry Needling, Electrical stimulation, Spinal mobilization, Cryotherapy, Moist heat, Taping, Traction, Manual therapy, and Re-evaluation.  PHYSICAL THERAPY DISCHARGE SUMMARY  Visits from Start of Care: 8  Current functional level related to goals / functional outcomes: See above   Remaining deficits: See above   Education / Equipment: Anatomy of condition, POC, HEP, exercise form/rationale    Patient agrees to discharge. Patient goals were partially met. Patient is being discharged due to financial reasons.   Merlyn Bollen C.  Nykayla Marcelli PT, DPT 05/11/22 4:37 PM

## 2022-05-11 ENCOUNTER — Encounter (HOSPITAL_BASED_OUTPATIENT_CLINIC_OR_DEPARTMENT_OTHER): Payer: Self-pay | Admitting: Physical Therapy

## 2022-05-22 ENCOUNTER — Encounter (HOSPITAL_BASED_OUTPATIENT_CLINIC_OR_DEPARTMENT_OTHER): Payer: Self-pay | Admitting: Physical Therapy

## 2022-08-25 ENCOUNTER — Ambulatory Visit (HOSPITAL_BASED_OUTPATIENT_CLINIC_OR_DEPARTMENT_OTHER): Payer: BC Managed Care – PPO | Attending: Physical Medicine and Rehabilitation | Admitting: Physical Therapy

## 2022-08-25 DIAGNOSIS — M545 Low back pain, unspecified: Secondary | ICD-10-CM | POA: Insufficient documentation

## 2022-08-25 DIAGNOSIS — M25511 Pain in right shoulder: Secondary | ICD-10-CM | POA: Diagnosis not present

## 2022-08-25 DIAGNOSIS — M67813 Other specified disorders of tendon, right shoulder: Secondary | ICD-10-CM | POA: Insufficient documentation

## 2022-08-25 DIAGNOSIS — M542 Cervicalgia: Secondary | ICD-10-CM | POA: Insufficient documentation

## 2022-08-25 DIAGNOSIS — M47896 Other spondylosis, lumbar region: Secondary | ICD-10-CM | POA: Insufficient documentation

## 2022-08-25 DIAGNOSIS — M79605 Pain in left leg: Secondary | ICD-10-CM | POA: Insufficient documentation

## 2022-08-25 DIAGNOSIS — G8929 Other chronic pain: Secondary | ICD-10-CM | POA: Insufficient documentation

## 2022-08-25 NOTE — Therapy (Signed)
OUTPATIENT PHYSICAL THERAPY SHOULDER EVALUATION   Patient Name: Keith Montoya MRN: [id number] DOB:1976/02/28, 46 y.o., male Today's Date: 08/26/2022   PT End of Session - 08/26/22 1400     Visit Number 1    Number of Visits 13    Date for PT Re-Evaluation 10/07/22    Authorization Type [employer] 30 VL    PT Start Time 1230    PT Stop Time 1315    PT Time Calculation (min) 45 min    Activity Tolerance Patient tolerated treatment well    Behavior During Therapy [facility] for tasks assessed/performed             History reviewed. No pertinent past medical history. History reviewed. No pertinent surgical history. There are no problems to display for this patient.   REFERRING PROVIDER: Laroy Apple, MD  REFERRING DIAG:  M25.511 (ICD-10-CM) - Right shoulder pain  M67.813 (ICD-10-CM) - Other specified disorders of tendon, right shoulder  M54.2 (ICD-10-CM) - Cervicalgia  M47.896 (ICD-10-CM) - Other spondylosis, lumbar region    THERAPY DIAG:  Chronic right shoulder pain  Pain in left leg  Chronic left-sided low back pain, unspecified whether sciatica present  Rationale for Evaluation and Treatment Rehabilitation  ONSET DATE: chronic  SUBJECTIVE:                                                                                                                                                                                      SUBJECTIVE STATEMENT: Was playing a lot of tennis at d/c last time. Felt a slow decline. DN a couple of tiems with Rob. By mid-August I felt miserable. Until now I have had a consistent spasm in Lt lateral quads, even at night- throbbing makes it difficult to sleep.  Still feeling a good knot post Rt shoulder. Tried to play 2x in fall league but stopped because it was so bad. Denies n/t down arm.   PERTINENT HISTORY: Rt elbow surgery  PAIN:  Are you having pain? Yes: NPRS scale: significant- limits activity/10 Pain location: post Rt shoulder, Left,  lateral thigh Pain description: tight Aggravating factors: trying to sleep, tennis Relieving factors: DN helped somewht this time  PRECAUTIONS: None  WEIGHT BEARING RESTRICTIONS No  FALLS:  Has patient fallen in last 6 months? No  PLOF: Independent  PATIENT GOALS decrease pain, back to sports/fitness  OBJECTIVE:   POSTURE: Lateral tracking Lt patella in standing Superior Lt sacral quadrant anteriorly rotated on oblique axis  UPPER EXTREMITY ROM:   Gross hypermobility throughout body  EXTREMITY MMT:  MMT (lb) Right eval Left eval  Shoulder flexion    Shoulder extension    Shoulder abduction  Shoulder adduction    Shoulder internal rotation    Shoulder external rotation 23 22.3  Knee extension 57.9 59.2      (Blank rows = not tested)   JOINT MOBILITY TESTING:  hypermobility  PALPATION:  Spasm in Rt infraspinatus, lats, subscap; VMO, VL   TODAY'S TREATMENT:  Treatment                            08/25/22:  Trigger Point Dry Needling, Manual Therapy Treatment:  Initial or subsequent education regarding Trigger Point Dry Needling: Subsequent Did patient give consent to treatment with Trigger Point Dry Needling: Yes TPDN with skilled palpation and monitoring followed by STM to the following muscles: Lt sartorius, VL; Rt infraspinatus, lats, mid traps  RT SIJ PA mobs, Rt post sacrum depression      PATIENT EDUCATION: Education details: Anatomy of condition, POC, HEP, exercise form/rationale Person educated: Patient Education method: Explanation and Verbal cues Education comprehension: verbalized understanding and needs further education   HOME EXERCISE PROGRAM: Previous HEP- [id number]  ASSESSMENT:  CLINICAL IMPRESSION: Patient is a 46 y.o. M who was seen today for physical therapy evaluation and treatment for recurrent biomechanical chain pain/tightness in Rt shoulder and Left thigh. I am familiar with this patient so we began treatment with DN today.  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 Will work to control and pain and retrain biomechanical chain   OBJECTIVE IMPAIRMENTS decreased activity tolerance, decreased strength, increased muscle spasms, impaired UE functional use, improper body mechanics, postural dysfunction, and pain.   ACTIVITY LIMITATIONS carrying, lifting, standing, squatting, sleeping, stairs, and locomotion level  PARTICIPATION LIMITATIONS: driving, community activity, occupation, and yard work  PERSONAL FACTORS Time since onset of injury/illness/exacerbation and 1 comorbidity: general hypermobility  are also affecting patient's functional outcome.   REHAB POTENTIAL: Good  CLINICAL DECISION MAKING: Stable/uncomplicated  EVALUATION COMPLEXITY: Low   GOALS: Goals reviewed with patient? Yes  SHORT TERM GOALS: Target date: 09/16/22  Will return to daily compliance with HEP Baseline: not doing as well as probably should at eval Goal status: INITIAL   LONG TERM GOALS: Target date: POC date  Able to sleep without limitation by thigh pain Baseline: throbbing makes it difficult to sleep Goal status: INITIAL  2.  Able to return to pickle ball Baseline: unable due to pain at eval Goal status: INITIAL  3.  GHJ ER strength to improve by 15% bilaterally  Baseline: see chart Goal status: INITIAL    PLAN: PT FREQUENCY: 1-2x/week  PT DURATION: 6 weeks  PLANNED INTERVENTIONS: Therapeutic exercises, Therapeutic activity, Neuromuscular re-education, Balance training, Patient/Family education, Self Care, Joint mobilization, Aquatic Therapy, Dry Needling, Electrical stimulation, Spinal mobilization, Cryotherapy, Moist heat, Taping, Traction, Ionotophoresis 4mg /ml Dexamethasone, Manual therapy, and Re-evaluation  PLAN FOR NEXT SESSION: DN PRN, address sacral torsion.    Keith Montoya C. Dasha Kawabata PT, DPT 08/26/22 2:22 PM

## 2022-08-26 ENCOUNTER — Encounter (HOSPITAL_BASED_OUTPATIENT_CLINIC_OR_DEPARTMENT_OTHER): Payer: Self-pay | Admitting: Physical Therapy

## 2022-08-26 ENCOUNTER — Other Ambulatory Visit: Payer: Self-pay

## 2022-08-28 ENCOUNTER — Encounter (HOSPITAL_BASED_OUTPATIENT_CLINIC_OR_DEPARTMENT_OTHER): Payer: Self-pay | Admitting: Physical Therapy

## 2022-08-28 ENCOUNTER — Ambulatory Visit (HOSPITAL_BASED_OUTPATIENT_CLINIC_OR_DEPARTMENT_OTHER): Payer: BC Managed Care – PPO | Admitting: Physical Therapy

## 2022-08-28 DIAGNOSIS — M545 Low back pain, unspecified: Secondary | ICD-10-CM

## 2022-08-28 DIAGNOSIS — M79605 Pain in left leg: Secondary | ICD-10-CM

## 2022-08-28 DIAGNOSIS — M25511 Pain in right shoulder: Secondary | ICD-10-CM | POA: Diagnosis not present

## 2022-08-28 DIAGNOSIS — G8929 Other chronic pain: Secondary | ICD-10-CM

## 2022-08-28 NOTE — Therapy (Signed)
OUTPATIENT PHYSICAL THERAPY SHOULDER TREATMENT   Patient Name: Keith Montoya MRN: [id number] DOB:11-20-1975, 46 y.o., male Today's Date: 08/28/2022   PT End of Session - 08/28/22 1015     Visit Number 2    Number of Visits 13    Date for PT Re-Evaluation 10/07/22    Authorization Type [employer] 30 VL    PT Start Time [id number]    PT Stop Time 1058    PT Time Calculation (min) 43 min    Activity Tolerance Patient tolerated treatment well    Behavior During Therapy [facility] for tasks assessed/performed             History reviewed. No pertinent past medical history. History reviewed. No pertinent surgical history. There are no problems to display for this patient.   REFERRING PROVIDER: Laroy Apple, MD  REFERRING DIAG:  M25.511 (ICD-10-CM) - Right shoulder pain  M67.813 (ICD-10-CM) - Other specified disorders of tendon, right shoulder  M54.2 (ICD-10-CM) - Cervicalgia  M47.896 (ICD-10-CM) - Other spondylosis, lumbar region    THERAPY DIAG:  Chronic right shoulder pain  Pain in left leg  Chronic left-sided low back pain, unspecified whether sciatica present  Rationale for Evaluation and Treatment Rehabilitation  ONSET DATE: chronic  SUBJECTIVE:                                                                                                                                                                                      SUBJECTIVE STATEMENT: Played a little, used theragun on shoulder. Very sore after last session.   PERTINENT HISTORY: Rt elbow surgery  PAIN:  Are you having pain? Yes: NPRS scale: significant- limits activity/10 Pain location: post Rt shoulder, Left, lateral thigh Pain description: tight Aggravating factors: trying to sleep, tennis Relieving factors: DN helped somewht this time  PRECAUTIONS: None  WEIGHT BEARING RESTRICTIONS No  FALLS:  Has patient fallen in last 6 months? No  PLOF: Independent  PATIENT GOALS decrease pain, back to  sports/fitness  OBJECTIVE:   POSTURE: Lateral tracking Lt patella in standing Superior Lt sacral quadrant anteriorly rotated on oblique axis  UPPER EXTREMITY ROM:   Gross hypermobility throughout body  EXTREMITY MMT:  MMT (lb) Right eval Left eval  Shoulder flexion    Shoulder extension    Shoulder abduction    Shoulder adduction    Shoulder internal rotation    Shoulder external rotation 23 22.3  Knee extension 57.9 59.2      (Blank rows = not tested)   JOINT MOBILITY TESTING:  hypermobility  PALPATION:  Spasm in Rt infraspinatus, lats, subscap; VMO, VL   TODAY'S TREATMENT:   Treatment  10/16:  Trigger Point Dry Needling, Manual Therapy Treatment:  Initial or subsequent education regarding Trigger Point Dry Needling: Subsequent Did patient give consent to treatment with Trigger Point Dry Needling: Yes TPDN with skilled palpation and monitoring followed by STM to the following muscles: Rt thoraco lumbar paraspinals, Lt sartorius, VL, VMO  Manual PA Rt upper sacral quadrant  inf mob of Rt innominate in prone Supine towel roll under border of Rt sacrum with Rt knee bent Standing flexion with towel roll behind SIJs at wall   Treatment                            08/25/22:  Trigger Point Dry Needling, Manual Therapy Treatment:  Initial or subsequent education regarding Trigger Point Dry Needling: Subsequent Did patient give consent to treatment with Trigger Point Dry Needling: Yes TPDN with skilled palpation and monitoring followed by STM to the following muscles: Lt sartorius, VL; Rt infraspinatus, lats, mid traps  RT SIJ PA mobs, Rt post sacrum depression      PATIENT EDUCATION: Education details: Anatomy of condition, POC, HEP, exercise form/rationale Person educated: Patient Education method: Explanation and Verbal cues Education comprehension: verbalized understanding and needs further education   HOME EXERCISE  PROGRAM: Previous HEP- [id number]  ASSESSMENT:  CLINICAL IMPRESSION: Added 1 layer heel lift to Lt shoe today as he has done this in the past. Notable tightness in Lt thigh with forward rotation of Rt shoulder during standing mobilization. Will evaluate outcome of regular wear of heel lift and SIJ treatment and move into biomechanical chain stability via PRI exercises.    OBJECTIVE IMPAIRMENTS decreased activity tolerance, decreased strength, increased muscle spasms, impaired UE functional use, improper body mechanics, postural dysfunction, and pain.   ACTIVITY LIMITATIONS carrying, lifting, standing, squatting, sleeping, stairs, and locomotion level  PARTICIPATION LIMITATIONS: driving, community activity, occupation, and yard work  PERSONAL FACTORS Time since onset of injury/illness/exacerbation and 1 comorbidity: general hypermobility  are also affecting patient's functional outcome.     GOALS: Goals reviewed with patient? Yes  SHORT TERM GOALS: Target date: 09/16/22  Will return to daily compliance with HEP Baseline: not doing as well as probably should at eval Goal status: INITIAL   LONG TERM GOALS: Target date: POC date  Able to sleep without limitation by thigh pain Baseline: throbbing makes it difficult to sleep Goal status: INITIAL  2.  Able to return to pickle ball Baseline: unable due to pain at eval Goal status: INITIAL  3.  GHJ ER strength to improve by 15% bilaterally  Baseline: see chart Goal status: INITIAL    PLAN: PT FREQUENCY: 1-2x/week  PT DURATION: 6 weeks  PLANNED INTERVENTIONS: Therapeutic exercises, Therapeutic activity, Neuromuscular re-education, Balance training, Patient/Family education, Self Care, Joint mobilization, Aquatic Therapy, Dry Needling, Electrical stimulation, Spinal mobilization, Cryotherapy, Moist heat, Taping, Traction, Ionotophoresis 4mg /ml Dexamethasone, Manual therapy, and Re-evaluation  PLAN FOR NEXT SESSION: DN PRN,  address sacral torsion. Outcome of heel lift?   Atheena Spano C. Julius Boniface PT, DPT 08/28/22 10:59 AM

## 2022-08-31 ENCOUNTER — Encounter (HOSPITAL_BASED_OUTPATIENT_CLINIC_OR_DEPARTMENT_OTHER): Payer: Self-pay | Admitting: Physical Therapy

## 2022-08-31 ENCOUNTER — Ambulatory Visit (HOSPITAL_BASED_OUTPATIENT_CLINIC_OR_DEPARTMENT_OTHER): Payer: BC Managed Care – PPO | Admitting: Physical Therapy

## 2022-08-31 DIAGNOSIS — G8929 Other chronic pain: Secondary | ICD-10-CM

## 2022-08-31 DIAGNOSIS — M79605 Pain in left leg: Secondary | ICD-10-CM

## 2022-08-31 DIAGNOSIS — M25511 Pain in right shoulder: Secondary | ICD-10-CM | POA: Diagnosis not present

## 2022-08-31 NOTE — Therapy (Signed)
OUTPATIENT PHYSICAL THERAPY SHOULDER TREATMENT   Patient Name: Keith Montoya MRN: [id number] DOB:1976-10-20, 46 y.o., male Today's Date: 08/31/2022   PT End of Session - 08/31/22 1147     Visit Number 3    Number of Visits 13    Date for PT Re-Evaluation 10/07/22    Authorization Type BCBS 30 VL    PT Start Time 1147    PT Stop Time 1228    PT Time Calculation (min) 41 min    Activity Tolerance Patient tolerated treatment well    Behavior During Therapy [facility] for tasks assessed/performed             History reviewed. No pertinent past medical history. History reviewed. No pertinent surgical history. There are no problems to display for this patient.   REFERRING PROVIDER: Romero Belling, MD  REFERRING DIAG:  M25.511 (ICD-10-CM) - Right shoulder pain  M67.813 (ICD-10-CM) - Other specified disorders of tendon, right shoulder  M54.2 (ICD-10-CM) - Cervicalgia  M47.896 (ICD-10-CM) - Other spondylosis, lumbar region    THERAPY DIAG:  Chronic right shoulder pain  Pain in left leg  Chronic left-sided low back pain, unspecified whether sciatica present  Rationale for Evaluation and Treatment Rehabilitation  ONSET DATE: chronic  SUBJECTIVE:                                                                                                                                                                                      SUBJECTIVE STATEMENT: SIj has been better. Woke up feeling the thigh, shoulder seems to be more sore with less back tightness.   PERTINENT HISTORY: Rt elbow surgery  PAIN:  Are you having pain? Yes: NPRS scale: significant- limits activity/10 Pain location: post Rt shoulder, Left, lateral thigh Pain description: tight Aggravating factors: trying to sleep, tennis Relieving factors: DN helped somewht this time  PRECAUTIONS: None  WEIGHT BEARING RESTRICTIONS No  FALLS:  Has patient fallen in last 6 months? No  PLOF: Independent  PATIENT GOALS  decrease pain, back to sports/fitness  OBJECTIVE:   POSTURE: Lateral tracking Lt patella in standing Superior Lt sacral quadrant anteriorly rotated on oblique axis  UPPER EXTREMITY ROM:   Gross hypermobility throughout body  EXTREMITY MMT:  MMT (lb) Right eval Left eval  Shoulder flexion    Shoulder extension    Shoulder abduction    Shoulder adduction    Shoulder internal rotation    Shoulder external rotation 23 22.3  Knee extension 57.9 59.2      (Blank rows = not tested)   JOINT MOBILITY TESTING:  hypermobility  PALPATION:  Spasm in Rt infraspinatus, lats, subscap; VMO, VL   TODAY'S TREATMENT:  Treatment                            08/31/22:  Trigger Point Dry Needling, Manual Therapy Treatment:  Initial or subsequent education regarding Trigger Point Dry Needling: Subsequent Did patient give consent to treatment with Trigger Point Dry Needling: Yes TPDN with skilled palpation and monitoring followed by STM to the following muscles: Lt VL, VMO; bil upper trap & infraspinatus, Rt lats Manual mobilization of ribs bilaterally  Rt Sidelying clams- feet pressed into wall for LE 90/90, towel roll bw ankles and added adduction of Rt knee All 4 belly lift walk (PRI)     Treatment                            10/16:  Trigger Point Dry Needling, Manual Therapy Treatment:  Initial or subsequent education regarding Trigger Point Dry Needling: Subsequent Did patient give consent to treatment with Trigger Point Dry Needling: Yes TPDN with skilled palpation and monitoring followed by STM to the following muscles: Rt thoraco lumbar paraspinals, Lt sartorius, VL, VMO  Manual PA Rt upper sacral quadrant  inf mob of Rt innominate in prone Supine towel roll under border of Rt sacrum with Rt knee bent Standing flexion with towel roll behind SIJs at wall   Treatment                            08/25/22:  Trigger Point Dry Needling, Manual Therapy Treatment:  Initial or  subsequent education regarding Trigger Point Dry Needling: Subsequent Did patient give consent to treatment with Trigger Point Dry Needling: Yes TPDN with skilled palpation and monitoring followed by STM to the following muscles: Lt sartorius, VL; Rt infraspinatus, lats, mid traps  RT SIJ PA mobs, Rt post sacrum depression      PATIENT EDUCATION: Education details: Anatomy of condition, POC, HEP, exercise form/rationale Person educated: Patient Education method: Explanation and Verbal cues Education comprehension: verbalized understanding and needs further education   HOME EXERCISE PROGRAM: Previous HEP- [id number]  ASSESSMENT:  CLINICAL IMPRESSION: SIJ felt good with heel lift so will continue to use that in left shoe. Left scapula noted to be elevated and winging more so than Rt today. Rt lats were very tight and notable hypertrophy continued in Rt lumbar paraspinals. Added PRI exercises for stability through core and Lt hip abductors.    OBJECTIVE IMPAIRMENTS decreased activity tolerance, decreased strength, increased muscle spasms, impaired UE functional use, improper body mechanics, postural dysfunction, and pain.   ACTIVITY LIMITATIONS carrying, lifting, standing, squatting, sleeping, stairs, and locomotion level  PARTICIPATION LIMITATIONS: driving, community activity, occupation, and yard work  PERSONAL FACTORS Time since onset of injury/illness/exacerbation and 1 comorbidity: general hypermobility  are also affecting patient's functional outcome.     GOALS: Goals reviewed with patient? Yes  SHORT TERM GOALS: Target date: 09/16/22  Will return to daily compliance with HEP Baseline: not doing as well as probably should at eval Goal status: INITIAL   LONG TERM GOALS: Target date: POC date  Able to sleep without limitation by thigh pain Baseline: throbbing makes it difficult to sleep Goal status: INITIAL  2.  Able to return to pickle ball Baseline: unable due to  pain at eval Goal status: INITIAL  3.  GHJ ER strength to improve by 15% bilaterally  Baseline: see chart Goal status: INITIAL  PLAN: PT FREQUENCY: 1-2x/week  PT DURATION: 6 weeks  PLANNED INTERVENTIONS: Therapeutic exercises, Therapeutic activity, Neuromuscular re-education, Balance training, Patient/Family education, Self Care, Joint mobilization, Aquatic Therapy, Dry Needling, Electrical stimulation, Spinal mobilization, Cryotherapy, Moist heat, Taping, Traction, Ionotophoresis 4mg /ml Dexamethasone, Manual therapy, and Re-evaluation  PLAN FOR NEXT SESSION: DN PRN, cont chain stability   Edrei Norgaard C. Tanecia Mccay PT, DPT 08/31/22 12:39 PM

## 2022-09-11 ENCOUNTER — Encounter (HOSPITAL_BASED_OUTPATIENT_CLINIC_OR_DEPARTMENT_OTHER): Payer: Self-pay | Admitting: Physical Therapy

## 2022-09-11 ENCOUNTER — Ambulatory Visit (HOSPITAL_BASED_OUTPATIENT_CLINIC_OR_DEPARTMENT_OTHER): Payer: BC Managed Care – PPO | Admitting: Physical Therapy

## 2022-09-11 DIAGNOSIS — M542 Cervicalgia: Secondary | ICD-10-CM

## 2022-09-11 DIAGNOSIS — G8929 Other chronic pain: Secondary | ICD-10-CM

## 2022-09-11 DIAGNOSIS — M79605 Pain in left leg: Secondary | ICD-10-CM

## 2022-09-11 DIAGNOSIS — M5459 Other low back pain: Secondary | ICD-10-CM

## 2022-09-11 DIAGNOSIS — M25511 Pain in right shoulder: Secondary | ICD-10-CM | POA: Diagnosis not present

## 2022-09-11 NOTE — Therapy (Signed)
OUTPATIENT PHYSICAL THERAPY SHOULDER TREATMENT   Patient Name: Keith Montoya MRN: [id number] DOB:1976-08-25, 46 y.o., male Today's Date: 09/11/2022   PT End of Session - 09/11/22 1254     Visit Number 4    Number of Visits 13    Date for PT Re-Evaluation 10/07/22    Authorization Type BCBS 30 VL    PT Start Time 1300    PT Stop Time 1343    PT Time Calculation (min) 43 min    Activity Tolerance Patient tolerated treatment well    Behavior During Therapy [facility] for tasks assessed/performed             History reviewed. No pertinent past medical history. History reviewed. No pertinent surgical history. There are no problems to display for this patient.   REFERRING PROVIDER: Laroy Apple, MD  REFERRING DIAG:  M25.511 (ICD-10-CM) - Right shoulder pain  M67.813 (ICD-10-CM) - Other specified disorders of tendon, right shoulder  M54.2 (ICD-10-CM) - Cervicalgia  M47.896 (ICD-10-CM) - Other spondylosis, lumbar region    THERAPY DIAG:  Chronic right shoulder pain  Pain in left leg  Chronic left-sided low back pain, unspecified whether sciatica present  Other low back pain  Cervicalgia  Rationale for Evaluation and Treatment Rehabilitation  ONSET DATE: chronic  SUBJECTIVE:                                                                                                                                                                                      SUBJECTIVE STATEMENT: Played on last Sat and Monday night with def some restriction still through the leg. Haven't done anything strenuous since then. I still get moments where I feel the leg get tight but it is not all the time now. With improvement in leg, I am noticing more tightness in lower back.    PERTINENT HISTORY: Rt elbow surgery  PAIN:  Are you having pain? Yes: NPRS scale: significant- limits activity/10 Pain location: post Rt shoulder, Left, lateral thigh Pain description: tight Aggravating factors:  trying to sleep, tennis Relieving factors: DN helped somewht this time  PRECAUTIONS: None  WEIGHT BEARING RESTRICTIONS No  FALLS:  Has patient fallen in last 6 months? No  PLOF: Independent  PATIENT GOALS decrease pain, back to sports/fitness  OBJECTIVE:   POSTURE: Lateral tracking Lt patella in standing Superior Lt sacral quadrant anteriorly rotated on oblique axis  UPPER EXTREMITY ROM:   Gross hypermobility throughout body  EXTREMITY MMT:  MMT (lb) Right eval Left eval  Shoulder flexion    Shoulder extension    Shoulder abduction    Shoulder adduction    Shoulder internal rotation    Shoulder  external rotation 23 22.3  Knee extension 57.9 59.2      (Blank rows = not tested)   JOINT MOBILITY TESTING:  hypermobility  PALPATION:  Spasm in Rt infraspinatus, lats, subscap; VMO, VL   TODAY'S TREATMENT:   Treatment                            09/11/22:  Trigger Point Dry Needling, Manual Therapy Treatment:  Initial or subsequent education regarding Trigger Point Dry Needling: Initial Did patient give consent to treatment with Trigger Point Dry Needling: Yes TPDN with skilled palpation and monitoring followed by STM to the following muscles: Rt upper trap, levator scap  Manual mobilization to Rt upper rib cage In prone Supine crunches Rt SLR with curl up- Lt UE overhead Supine leg lower in turnout- both feet starting up Lower body crunches ball bw knees- PT to assist in eccentric control PRI: Rt Sidelying clams- feet pressed into wall for LE 90/90, ball bw ankles and added adduction of Rt knee PRI: long seated supported press down with Right iliacus and psoas   Treatment                            08/31/22:  Trigger Point Dry Needling, Manual Therapy Treatment:  Initial or subsequent education regarding Trigger Point Dry Needling: Subsequent Did patient give consent to treatment with Trigger Point Dry Needling: Yes TPDN with skilled palpation and  monitoring followed by STM to the following muscles: Lt VL, VMO; bil upper trap & infraspinatus, Rt lats Manual mobilization of ribs bilaterally  Rt Sidelying clams- feet pressed into wall for LE 90/90, towel roll bw ankles and added adduction of Rt knee All 4 belly lift walk (PRI)     Treatment                            10/16:  Trigger Point Dry Needling, Manual Therapy Treatment:  Initial or subsequent education regarding Trigger Point Dry Needling: Subsequent Did patient give consent to treatment with Trigger Point Dry Needling: Yes TPDN with skilled palpation and monitoring followed by STM to the following muscles: Rt thoraco lumbar paraspinals, Lt sartorius, VL, VMO  Manual PA Rt upper sacral quadrant  inf mob of Rt innominate in prone Supine towel roll under border of Rt sacrum with Rt knee bent Standing flexion with towel roll behind SIJs at wall   PATIENT EDUCATION: Education details: Teacher, music of condition, POC, HEP, exercise form/rationale Person educated: Patient Education method: Explanation and Verbal cues Education comprehension: verbalized understanding and needs further education   HOME EXERCISE PROGRAM: Previous HEP- [id number] PRI: Rt Sidelying clams- feet pressed into wall for LE 90/90, ball bw ankles and added adduction of Rt knee PRI: long seated supported press down with Right iliacus and psoas  ASSESSMENT:  CLINICAL IMPRESSION: Continued PRI exercises to engage Rt obliques. Good tolerance to exercise and twitch response in upper trap.      OBJECTIVE IMPAIRMENTS decreased activity tolerance, decreased strength, increased muscle spasms, impaired UE functional use, improper body mechanics, postural dysfunction, and pain.   ACTIVITY LIMITATIONS carrying, lifting, standing, squatting, sleeping, stairs, and locomotion level  PARTICIPATION LIMITATIONS: driving, community activity, occupation, and yard work  PERSONAL FACTORS Time since onset of  injury/illness/exacerbation and 1 comorbidity: general hypermobility  are also affecting patient's functional outcome.     GOALS: Goals reviewed  with patient? Yes  SHORT TERM GOALS: Target date: 09/16/22  Will return to daily compliance with HEP Baseline: not doing as well as probably should at eval Goal status: INITIAL   LONG TERM GOALS: Target date: POC date  Able to sleep without limitation by thigh pain Baseline: throbbing makes it difficult to sleep Goal status: INITIAL  2.  Able to return to pickle ball Baseline: unable due to pain at eval Goal status: INITIAL  3.  GHJ ER strength to improve by 15% bilaterally  Baseline: see chart Goal status: INITIAL    PLAN: PT FREQUENCY: 1-2x/week  PT DURATION: 6 weeks  PLANNED INTERVENTIONS: Therapeutic exercises, Therapeutic activity, Neuromuscular re-education, Balance training, Patient/Family education, Self Care, Joint mobilization, Aquatic Therapy, Dry Needling, Electrical stimulation, Spinal mobilization, Cryotherapy, Moist heat, Taping, Traction, Ionotophoresis 4mg /ml Dexamethasone, Manual therapy, and Re-evaluation  PLAN FOR NEXT SESSION: DN PRN, cont chain stability, STG   Katielynn Horan C. Dmauri Rosenow PT, DPT 09/11/22 1:45 PM

## 2022-09-13 ENCOUNTER — Encounter (HOSPITAL_BASED_OUTPATIENT_CLINIC_OR_DEPARTMENT_OTHER): Payer: Self-pay | Admitting: Physical Therapy

## 2022-09-14 NOTE — Therapy (Signed)
OUTPATIENT PHYSICAL THERAPY SHOULDER TREATMENT   Patient Name: Keith Montoya MRN: [id number] DOB:04/03/1976, 46 y.o., male Today's Date: 09/15/2022   PT End of Session - 09/15/22 0930     Visit Number 5    Number of Visits 13    Date for PT Re-Evaluation 10/07/22    Authorization Type BCBS 30 VL    PT Start Time 0930    PT Stop Time 1010    PT Time Calculation (min) 40 min    Activity Tolerance Patient tolerated treatment well    Behavior During Therapy [facility] for tasks assessed/performed              History reviewed. No pertinent past medical history. History reviewed. No pertinent surgical history. There are no problems to display for this patient.   REFERRING PROVIDER: Romero Belling, MD  REFERRING DIAG:  M25.511 (ICD-10-CM) - Right shoulder pain  M67.813 (ICD-10-CM) - Other specified disorders of tendon, right shoulder  M54.2 (ICD-10-CM) - Cervicalgia  M47.896 (ICD-10-CM) - Other spondylosis, lumbar region    THERAPY DIAG:  Chronic right shoulder pain  Pain in left leg  Chronic left-sided low back pain, unspecified whether sciatica present  Rationale for Evaluation and Treatment Rehabilitation  ONSET DATE: chronic  SUBJECTIVE:                                                                                                                                                                                      SUBJECTIVE STATEMENT: Shoulder stayed pretty loose, Lt lateral thigh tightened back up some. On feet for about 8 hr on Wed and tightened up about half way.    PERTINENT HISTORY: Rt elbow surgery  PAIN:  Are you having pain? Yes: NPRS scale: significant- limits activity/10 Pain location: post Rt shoulder, Left, lateral thigh Pain description: tight Aggravating factors: trying to sleep, tennis Relieving factors: DN helped somewht this time  PRECAUTIONS: None  WEIGHT BEARING RESTRICTIONS No  FALLS:  Has patient fallen in last 6 months?  No  PLOF: Independent  PATIENT GOALS decrease pain, back to sports/fitness  OBJECTIVE:   POSTURE: Lateral tracking Lt patella in standing Superior Lt sacral quadrant anteriorly rotated on oblique axis  UPPER EXTREMITY ROM:   Gross hypermobility throughout body  EXTREMITY MMT:  MMT (lb) Right eval Left eval  Shoulder flexion    Shoulder extension    Shoulder abduction    Shoulder adduction    Shoulder internal rotation    Shoulder external rotation 23 22.3  Knee extension 57.9 59.2      (Blank rows = not tested)   JOINT MOBILITY TESTING:  hypermobility  PALPATION:  Spasm in Rt infraspinatus,  lats, subscap; VMO, VL   TODAY'S TREATMENT:   Treatment                            09/15/22:  Trigger Point Dry Needling, Manual Therapy Treatment:  Initial or subsequent education regarding Trigger Point Dry Needling: Subsequent Did patient give consent to treatment with Trigger Point Dry Needling: Yes TPDN with skilled palpation and monitoring followed by STM to the following muscles: Lt VM, VL, sartorius  IASTM Lt quads- ant & lateral PRI: Rt Sidelying clams- feet pressed into wall for LE 90/90, ball bw ankles and added adduction of Rt knee PRI- all 4 belly lift walk PRI: long seated supported press down with Right iliacus and psoas   Treatment                            09/11/22:  Trigger Point Dry Needling, Manual Therapy Treatment:  Initial or subsequent education regarding Trigger Point Dry Needling: Initial Did patient give consent to treatment with Trigger Point Dry Needling: Yes TPDN with skilled palpation and monitoring followed by STM to the following muscles: Rt upper trap, levator scap  Manual mobilization to Rt upper rib cage In prone Supine crunches Rt SLR with curl up- Lt UE overhead Supine leg lower in turnout- both feet starting up Lower body crunches ball bw knees- PT to assist in eccentric control PRI: Rt Sidelying clams- feet pressed into wall for  LE 90/90, ball bw ankles and added adduction of Rt knee PRI: long seated supported press down with Right iliacus and psoas   Treatment                            08/31/22:  Trigger Point Dry Needling, Manual Therapy Treatment:  Initial or subsequent education regarding Trigger Point Dry Needling: Subsequent Did patient give consent to treatment with Trigger Point Dry Needling: Yes TPDN with skilled palpation and monitoring followed by STM to the following muscles: Lt VL, VMO; bil upper trap & infraspinatus, Rt lats Manual mobilization of ribs bilaterally  Rt Sidelying clams- feet pressed into wall for LE 90/90, towel roll bw ankles and added adduction of Rt knee All 4 belly lift walk (PRI)    PATIENT EDUCATION: Education details: Anatomy of condition, POC, HEP, exercise form/rationale Person educated: Patient Education method: Explanation and Verbal cues Education comprehension: verbalized understanding and needs further education   HOME EXERCISE PROGRAM: Previous HEP- [id number] PRI: Rt Sidelying clams- feet pressed into wall for LE 90/90, ball bw ankles and added adduction of Rt knee PRI: long seated supported press down with Right iliacus and psoas  ASSESSMENT:  CLINICAL IMPRESSION: DN reduced spasm in quads and focused on PRI exercise form. Pt denied any discomfort at the end of the session today outside of fatigue. Notable difficulty engaging lats that we will continue to work on for central stability.     OBJECTIVE IMPAIRMENTS decreased activity tolerance, decreased strength, increased muscle spasms, impaired UE functional use, improper body mechanics, postural dysfunction, and pain.   ACTIVITY LIMITATIONS carrying, lifting, standing, squatting, sleeping, stairs, and locomotion level  PARTICIPATION LIMITATIONS: driving, community activity, occupation, and yard work  PERSONAL FACTORS Time since onset of injury/illness/exacerbation and 1 comorbidity: general hypermobility   are also affecting patient's functional outcome.     GOALS: Goals reviewed with patient? Yes  SHORT TERM GOALS: Target date:  09/16/22  Will return to daily compliance with HEP Baseline: asked that he focus on 3 PRI exercises Goal status:ongoing   LONG TERM GOALS: Target date: POC date  Able to sleep without limitation by thigh pain Baseline: throbbing makes it difficult to sleep Goal status: INITIAL  2.  Able to return to pickle ball Baseline: unable due to pain at eval Goal status: INITIAL  3.  GHJ ER strength to improve by 15% bilaterally  Baseline: see chart Goal status: INITIAL    PLAN: PT FREQUENCY: 1-2x/week  PT DURATION: 6 weeks  PLANNED INTERVENTIONS: Therapeutic exercises, Therapeutic activity, Neuromuscular re-education, Balance training, Patient/Family education, Self Care, Joint mobilization, Aquatic Therapy, Dry Needling, Electrical stimulation, Spinal mobilization, Cryotherapy, Moist heat, Taping, Traction, Ionotophoresis 4mg /ml Dexamethasone, Manual therapy, and Re-evaluation  PLAN FOR NEXT SESSION: DN PRN, cont chain stability   Thomasene Dubow C. Marvelous Woolford PT, DPT 09/15/22 10:14 AM

## 2022-09-15 ENCOUNTER — Ambulatory Visit (HOSPITAL_BASED_OUTPATIENT_CLINIC_OR_DEPARTMENT_OTHER): Payer: BC Managed Care – PPO | Attending: Physical Medicine and Rehabilitation | Admitting: Physical Therapy

## 2022-09-15 ENCOUNTER — Encounter (HOSPITAL_BASED_OUTPATIENT_CLINIC_OR_DEPARTMENT_OTHER): Payer: Self-pay | Admitting: Physical Therapy

## 2022-09-15 DIAGNOSIS — M25511 Pain in right shoulder: Secondary | ICD-10-CM | POA: Insufficient documentation

## 2022-09-15 DIAGNOSIS — M79605 Pain in left leg: Secondary | ICD-10-CM | POA: Insufficient documentation

## 2022-09-15 DIAGNOSIS — M5459 Other low back pain: Secondary | ICD-10-CM | POA: Diagnosis present

## 2022-09-15 DIAGNOSIS — G8929 Other chronic pain: Secondary | ICD-10-CM | POA: Insufficient documentation

## 2022-09-15 DIAGNOSIS — M545 Low back pain, unspecified: Secondary | ICD-10-CM | POA: Diagnosis present

## 2022-09-20 ENCOUNTER — Encounter (HOSPITAL_BASED_OUTPATIENT_CLINIC_OR_DEPARTMENT_OTHER): Payer: Self-pay | Admitting: Physical Therapy

## 2022-09-20 ENCOUNTER — Ambulatory Visit (HOSPITAL_BASED_OUTPATIENT_CLINIC_OR_DEPARTMENT_OTHER): Payer: BC Managed Care – PPO | Admitting: Physical Therapy

## 2022-09-20 DIAGNOSIS — M25511 Pain in right shoulder: Secondary | ICD-10-CM | POA: Diagnosis not present

## 2022-09-20 DIAGNOSIS — M79605 Pain in left leg: Secondary | ICD-10-CM

## 2022-09-20 DIAGNOSIS — G8929 Other chronic pain: Secondary | ICD-10-CM

## 2022-09-20 NOTE — Therapy (Signed)
OUTPATIENT PHYSICAL THERAPY SHOULDER TREATMENT   Patient Name: Keith Montoya MRN: [id number] DOB:[birth year], 46 y.o., male Today's Date: 09/20/2022   PT End of Session - 09/20/22 1145     Visit Number 6    Number of Visits 13    Date for PT Re-Evaluation 10/07/22    Authorization Type BCBS 30 VL    PT Start Time 1145    PT Stop Time 1229    PT Time Calculation (min) 44 min    Activity Tolerance Patient tolerated treatment well    Behavior During Therapy [facility] for tasks assessed/performed              History reviewed. No pertinent past medical history. History reviewed. No pertinent surgical history. There are no problems to display for this patient.   REFERRING PROVIDER: Romero Belling, MD  REFERRING DIAG:  M25.511 (ICD-10-CM) - Right shoulder pain  M67.813 (ICD-10-CM) - Other specified disorders of tendon, right shoulder  M54.2 (ICD-10-CM) - Cervicalgia  M47.896 (ICD-10-CM) - Other spondylosis, lumbar region    THERAPY DIAG:  Chronic right shoulder pain  Pain in left leg  Rationale for Evaluation and Treatment Rehabilitation  ONSET DATE: chronic  SUBJECTIVE:                                                                                                                                                                                      SUBJECTIVE STATEMENT: Was on my feet a lot for the tournament but on Monday I had a lot of tightness in lateral left thigh. Mowed the lawn and did ok. Right now just sitting here it's like there is a murmur, no sharp pain.     PERTINENT HISTORY: Rt elbow surgery  PAIN:  Are you having pain? Yes: NPRS scale: significant- limits activity/10 Pain location: post Rt shoulder, Left, lateral thigh Pain description: tight Aggravating factors: trying to sleep, tennis Relieving factors: DN helped somewht this time  PRECAUTIONS: None  WEIGHT BEARING RESTRICTIONS No  FALLS:  Has patient fallen in last 6 months? No  PLOF:  Independent  PATIENT GOALS decrease pain, back to sports/fitness  OBJECTIVE:   POSTURE: Lateral tracking Lt patella in standing Superior Lt sacral quadrant anteriorly rotated on oblique axis  UPPER EXTREMITY ROM:   Gross hypermobility throughout body  EXTREMITY MMT:  MMT (lb) Right eval Left eval  Shoulder flexion    Shoulder extension    Shoulder abduction    Shoulder adduction    Shoulder internal rotation    Shoulder external rotation 23 22.3  Knee extension 57.9 59.2      (Blank rows = not tested)   JOINT MOBILITY TESTING:  hypermobility  PALPATION:  Spasm in Rt infraspinatus, lats, subscap; VMO, VL   TODAY'S TREATMENT:   Treatment                            09/20/22:  Trigger Point Dry Needling, Manual Therapy Treatment:  Initial or subsequent education regarding Trigger Point Dry Needling: Subsequent Did patient give consent to treatment with Trigger Point Dry Needling: Yes TPDN with skilled palpation and monitoring followed by STM to the following muscles: VL, TFL on Left  IASTM ITB Lt ankle AP talocrural mobs at end range DF Ionto Lt femoral head bursa- 6hr wear, 1CC dexamethasone.  PRI: Rt Sidelying clams- feet pressed into wall for LE 90/90, ball bw ankles and added adduction of Rt knee PRI- all 4 belly lift walk   Treatment                            09/15/22:  Trigger Point Dry Needling, Manual Therapy Treatment:  Initial or subsequent education regarding Trigger Point Dry Needling: Subsequent Did patient give consent to treatment with Trigger Point Dry Needling: Yes TPDN with skilled palpation and monitoring followed by STM to the following muscles: Lt VM, VL, sartorius  IASTM Lt quads- ant & lateral PRI: Rt Sidelying clams- feet pressed into wall for LE 90/90, ball bw ankles and added adduction of Rt knee PRI- all 4 belly lift walk PRI: long seated supported press down with Right iliacus and psoas   Treatment                             09/11/22:  Trigger Point Dry Needling, Manual Therapy Treatment:  Initial or subsequent education regarding Trigger Point Dry Needling: Initial Did patient give consent to treatment with Trigger Point Dry Needling: Yes TPDN with skilled palpation and monitoring followed by STM to the following muscles: Rt upper trap, levator scap  Manual mobilization to Rt upper rib cage In prone Supine crunches Rt SLR with curl up- Lt UE overhead Supine leg lower in turnout- both feet starting up Lower body crunches ball bw knees- PT to assist in eccentric control PRI: Rt Sidelying clams- feet pressed into wall for LE 90/90, ball bw ankles and added adduction of Rt knee PRI: long seated supported press down with Right iliacus and psoas   PATIENT EDUCATION: Education details: Anatomy of condition, POC, HEP, exercise form/rationale Person educated: Patient Education method: Explanation and Verbal cues Education comprehension: verbalized understanding and needs further education   HOME EXERCISE PROGRAM: Previous HEP- [id number] PRI: Rt Sidelying clams- feet pressed into wall for LE 90/90, ball bw ankles and added adduction of Rt knee PRI: long seated supported press down with Right iliacus and psoas  ASSESSMENT:  CLINICAL IMPRESSION: Able to reduce ITB pain with treatment today, did not progress HEP today and requested that PRI exercises are the focus. Trial of ionto for greater trocanteric bursitis.     OBJECTIVE IMPAIRMENTS decreased activity tolerance, decreased strength, increased muscle spasms, impaired UE functional use, improper body mechanics, postural dysfunction, and pain.   ACTIVITY LIMITATIONS carrying, lifting, standing, squatting, sleeping, stairs, and locomotion level  PARTICIPATION LIMITATIONS: driving, community activity, occupation, and yard work  PERSONAL FACTORS Time since onset of injury/illness/exacerbation and 1 comorbidity: general hypermobility  are also affecting  patient's functional outcome.     GOALS: Goals reviewed with patient? Yes  SHORT TERM GOALS: Target date: 09/16/22  Will return to daily compliance with HEP Baseline: asked that he focus on 3 PRI exercises Goal status:ongoing   LONG TERM GOALS: Target date: POC date  Able to sleep without limitation by thigh pain Baseline: throbbing makes it difficult to sleep Goal status: INITIAL  2.  Able to return to pickle ball Baseline: unable due to pain at eval Goal status: INITIAL  3.  GHJ ER strength to improve by 15% bilaterally  Baseline: see chart Goal status: INITIAL    PLAN: PT FREQUENCY: 1-2x/week  PT DURATION: 6 weeks  PLANNED INTERVENTIONS: Therapeutic exercises, Therapeutic activity, Neuromuscular re-education, Balance training, Patient/Family education, Self Care, Joint mobilization, Aquatic Therapy, Dry Needling, Electrical stimulation, Spinal mobilization, Cryotherapy, Moist heat, Taping, Traction, Ionotophoresis 4mg /ml Dexamethasone, Manual therapy, and Re-evaluation  PLAN FOR NEXT SESSION: DN PRN, cont chain stability, Lt ankle stability   Dima Ferrufino C. Abigail Teall PT, DPT 09/20/22 7:36 PM

## 2022-09-25 ENCOUNTER — Encounter (HOSPITAL_BASED_OUTPATIENT_CLINIC_OR_DEPARTMENT_OTHER): Payer: Self-pay | Admitting: Physical Therapy

## 2022-09-25 ENCOUNTER — Ambulatory Visit (HOSPITAL_BASED_OUTPATIENT_CLINIC_OR_DEPARTMENT_OTHER): Payer: BC Managed Care – PPO | Admitting: Physical Therapy

## 2022-09-25 DIAGNOSIS — M5459 Other low back pain: Secondary | ICD-10-CM

## 2022-09-25 DIAGNOSIS — M25511 Pain in right shoulder: Secondary | ICD-10-CM | POA: Diagnosis not present

## 2022-09-25 DIAGNOSIS — M79605 Pain in left leg: Secondary | ICD-10-CM

## 2022-09-25 DIAGNOSIS — G8929 Other chronic pain: Secondary | ICD-10-CM

## 2022-09-25 NOTE — Therapy (Signed)
OUTPATIENT PHYSICAL THERAPY SHOULDER TREATMENT   Patient Name: Keith Montoya MRN: [id number] DOB:[birth year], 46 y.o., male Today's Date: 09/25/2022   PT End of Session - 09/25/22 1425     Visit Number 7    Number of Visits 13    Date for PT Re-Evaluation 10/07/22    Authorization Type BCBS 30 VL    PT Start Time 1430    PT Stop Time 1515    PT Time Calculation (min) 45 min    Activity Tolerance Patient tolerated treatment well    Behavior During Therapy [facility] for tasks assessed/performed              History reviewed. No pertinent past medical history. History reviewed. No pertinent surgical history. There are no problems to display for this patient.   REFERRING PROVIDER: Romero Belling, MD  REFERRING DIAG:  M25.511 (ICD-10-CM) - Right shoulder pain  M67.813 (ICD-10-CM) - Other specified disorders of tendon, right shoulder  M54.2 (ICD-10-CM) - Cervicalgia  M47.896 (ICD-10-CM) - Other spondylosis, lumbar region    THERAPY DIAG:  Chronic right shoulder pain  Pain in left leg  Chronic left-sided low back pain, unspecified whether sciatica present  Other low back pain  Rationale for Evaluation and Treatment Rehabilitation  ONSET DATE: chronic  SUBJECTIVE:                                                                                                                                                                                      SUBJECTIVE STATEMENT: I slept in a chair at the hospital on Wed night- I had some brutal pain through Left hip and thigh as well as posterior right shoulder. All4 belly lift walk has helped some- I can sleep but wake up hurting.  IT band was the loosest it has ever been after ionto patch.   PERTINENT HISTORY: Rt elbow surgery  PAIN:  Are you having pain? Yes: NPRS scale: significant- limits activity/10 Pain location: post Rt shoulder, Left, lateral thigh Pain description: tight Aggravating factors: trying to sleep,  tennis Relieving factors: DN helped somewht this time  PRECAUTIONS: None  WEIGHT BEARING RESTRICTIONS No  FALLS:  Has patient fallen in last 6 months? No  PLOF: Independent  PATIENT GOALS decrease pain, back to sports/fitness  OBJECTIVE:   POSTURE: Lateral tracking Lt patella in standing Superior Lt sacral quadrant anteriorly rotated on oblique axis  UPPER EXTREMITY ROM:   Gross hypermobility throughout body  EXTREMITY MMT:  MMT (lb) Right eval Left eval  Shoulder flexion    Shoulder extension    Shoulder abduction    Shoulder adduction    Shoulder internal rotation    Shoulder  external rotation 23 22.3  Knee extension 57.9 59.2      (Blank rows = not tested)   JOINT MOBILITY TESTING:  hypermobility  PALPATION:  Spasm in Rt infraspinatus, lats, subscap; VMO, VL   TODAY'S TREATMENT:   Treatment                            09/25/22:  Trigger Point Dry Needling, Manual Therapy Treatment:  Initial or subsequent education regarding Trigger Point Dry Needling: Subsequent Did patient give consent to treatment with Trigger Point Dry Needling: Yes TPDN with skilled palpation and monitoring followed by STM to the following mYesuscles: Rt upper trap, left infraspinatus; Lt VL & VMO  IASTM- Rt Lat & quads; stm and trigger point release in Rt periscapular region & into infraspinatus   Treatment                            09/20/22:  Trigger Point Dry Needling, Manual Therapy Treatment:  Initial or subsequent education regarding Trigger Point Dry Needling: Subsequent Did patient give consent to treatment with Trigger Point Dry Needling: Yes TPDN with skilled palpation and monitoring followed by STM to the following muscles: VL, TFL on Left  IASTM ITB Lt ankle AP talocrural mobs at end range DF Ionto Lt femoral head bursa- 6hr wear, 1CC dexamethasone.  PRI: Rt Sidelying clams- feet pressed into wall for LE 90/90, ball bw ankles and added adduction of Rt knee PRI- all  4 belly lift walk   Treatment                            09/15/22:  Trigger Point Dry Needling, Manual Therapy Treatment:  Initial or subsequent education regarding Trigger Point Dry Needling: Subsequent Did patient give consent to treatment with Trigger Point Dry Needling: Yes TPDN with skilled palpation and monitoring followed by STM to the following muscles: Lt VM, VL, sartorius  IASTM Lt quads- ant & lateral PRI: Rt Sidelying clams- feet pressed into wall for LE 90/90, ball bw ankles and added adduction of Rt knee PRI- all 4 belly lift walk PRI: long seated supported press down with Right iliacus and psoas   PATIENT EDUCATION: Education details: Anatomy of condition, POC, HEP, exercise form/rationale Person educated: Patient Education method: Explanation and Verbal cues Education comprehension: verbalized understanding and needs further education   HOME EXERCISE PROGRAM: Previous HEP- [id number] PRI: Rt Sidelying clams- feet pressed into wall for LE 90/90, ball bw ankles and added adduction of Rt knee PRI: long seated supported press down with Right iliacus and psoas  ASSESSMENT:  CLINICAL IMPRESSION: Gross increase in spasm along biomechanical chain that was decreased with DN today. Significant tightness in infraspinatus with minimal trigger points. Will continue to focus on PRI exercises and will try to get back on track.     OBJECTIVE IMPAIRMENTS decreased activity tolerance, decreased strength, increased muscle spasms, impaired UE functional use, improper body mechanics, postural dysfunction, and pain.   ACTIVITY LIMITATIONS carrying, lifting, standing, squatting, sleeping, stairs, and locomotion level  PARTICIPATION LIMITATIONS: driving, community activity, occupation, and yard work  PERSONAL FACTORS Time since onset of injury/illness/exacerbation and 1 comorbidity: general hypermobility  are also affecting patient's functional outcome.     GOALS: Goals reviewed  with patient? Yes  SHORT TERM GOALS: Target date: 09/16/22  Will return to daily compliance with HEP Baseline: asked  that he focus on 3 PRI exercises Goal status:ongoing   LONG TERM GOALS: Target date: POC date  Able to sleep without limitation by thigh pain Baseline: throbbing makes it difficult to sleep Goal status: INITIAL  2.  Able to return to pickle ball Baseline: unable due to pain at eval Goal status: INITIAL  3.  GHJ ER strength to improve by 15% bilaterally  Baseline: see chart Goal status: INITIAL    PLAN: PT FREQUENCY: 1-2x/week  PT DURATION: 6 weeks  PLANNED INTERVENTIONS: Therapeutic exercises, Therapeutic activity, Neuromuscular re-education, Balance training, Patient/Family education, Self Care, Joint mobilization, Aquatic Therapy, Dry Needling, Electrical stimulation, Spinal mobilization, Cryotherapy, Moist heat, Taping, Traction, Ionotophoresis 4mg /ml Dexamethasone, Manual therapy, and Re-evaluation  PLAN FOR NEXT SESSION: DN PRN, cont chain stability, Lt ankle stability   Jing Howatt C. Destry Bezdek PT, DPT 09/25/22 5:35 PM

## 2022-09-27 ENCOUNTER — Ambulatory Visit (HOSPITAL_BASED_OUTPATIENT_CLINIC_OR_DEPARTMENT_OTHER): Payer: BC Managed Care – PPO | Admitting: Physical Therapy

## 2022-09-28 ENCOUNTER — Ambulatory Visit (HOSPITAL_BASED_OUTPATIENT_CLINIC_OR_DEPARTMENT_OTHER): Payer: BC Managed Care – PPO | Admitting: Physical Therapy

## 2022-09-28 ENCOUNTER — Encounter (HOSPITAL_BASED_OUTPATIENT_CLINIC_OR_DEPARTMENT_OTHER): Payer: Self-pay | Admitting: Physical Therapy

## 2022-09-28 DIAGNOSIS — M25511 Pain in right shoulder: Secondary | ICD-10-CM | POA: Diagnosis not present

## 2022-09-28 DIAGNOSIS — G8929 Other chronic pain: Secondary | ICD-10-CM

## 2022-09-28 DIAGNOSIS — M79605 Pain in left leg: Secondary | ICD-10-CM

## 2022-09-28 DIAGNOSIS — M545 Low back pain, unspecified: Secondary | ICD-10-CM

## 2022-09-28 NOTE — Therapy (Signed)
OUTPATIENT PHYSICAL THERAPY SHOULDER TREATMENT   Patient Name: Keith Montoya MRN: [id number] DOB:1976/05/26, 46 y.o., male Today's Date: 09/28/2022   PT End of Session - 09/28/22 1226     Visit Number 8    Number of Visits 13    Date for PT Re-Evaluation 10/07/22    Authorization Type [employer]BS 30 VL    PT Start Time 1147    PT Stop Time 1226    PT Time Calculation (min) 39 min    Activity Tolerance Patient tolerated treatment well    Behavior During Therapy [facility] for tasks assessed/performed               History reviewed. No pertinent past medical history. History reviewed. No pertinent surgical history. There are no problems to display for this patient.   REFERRING PROVIDER: Romero Belling, MD  REFERRING DIAG:  M25.511 (ICD-10-CM) - Right shoulder pain  M67.813 (ICD-10-CM) - Other specified disorders of tendon, right shoulder  M54.2 (ICD-10-CM) - Cervicalgia  M47.896 (ICD-10-CM) - Other spondylosis, lumbar region    THERAPY DIAG:  Chronic right shoulder pain  Pain in left leg  Chronic left-sided low back pain, unspecified whether sciatica present  Rationale for Evaluation and Treatment Rehabilitation  ONSET DATE: chronic  SUBJECTIVE:                                                                                                                                                                                      SUBJECTIVE STATEMENT: Feeling more pain at anterior Lt hip. Tightness in right shoulder.   PERTINENT HISTORY: Rt elbow surgery  PAIN:  Are you having pain? Yes: NPRS scale: significant- limits activity/10 Pain location: post Rt shoulder, Left, lateral thigh Pain description: tight Aggravating factors: trying to sleep, tennis Relieving factors: DN helped somewht this time  PRECAUTIONS: None  WEIGHT BEARING RESTRICTIONS No  FALLS:  Has patient fallen in last 6 months? No  PLOF: Independent  PATIENT GOALS decrease pain, back to  sports/fitness  OBJECTIVE:   POSTURE: Lateral tracking Lt patella in standing Superior Lt sacral quadrant anteriorly rotated on oblique axis  UPPER EXTREMITY ROM:   Gross hypermobility throughout body  EXTREMITY MMT:  MMT (lb) Right eval Left eval  Shoulder flexion    Shoulder extension    Shoulder abduction    Shoulder adduction    Shoulder internal rotation    Shoulder external rotation 23 22.3  Knee extension 57.9 59.2      (Blank rows = not tested)   JOINT MOBILITY TESTING:  hypermobility  PALPATION:  Spasm in Rt infraspinatus, lats, subscap; VMO, VL   TODAY'S TREATMENT:   Treatment  09/28/22:  Trigger Point Dry Needling, Manual Therapy Treatment:  Initial or subsequent education regarding Trigger Point Dry Needling: Subsequent Did patient give consent to treatment with Trigger Point Dry Needling: Yes TPDN with skilled palpation and monitoring followed by STM to the following muscles: Lt TFL, glut med/min; Left upper trap & levator scap   Prone Lt first rib depression, prone rib & thoracic mobs with mid thoracic Rt to Lt rotational mobilziation PRI reverse door squat Lt TFL stretch   Treatment                            09/25/22:  Trigger Point Dry Needling, Manual Therapy Treatment:  Initial or subsequent education regarding Trigger Point Dry Needling: Subsequent Did patient give consent to treatment with Trigger Point Dry Needling: Yes TPDN with skilled palpation and monitoring followed by STM to the following mYesuscles: Rt upper trap, left infraspinatus; Lt VL & VMO  IASTM- Rt Lat & quads; stm and trigger point release in Rt periscapular region & into infraspinatus   Treatment                            09/20/22:  Trigger Point Dry Needling, Manual Therapy Treatment:  Initial or subsequent education regarding Trigger Point Dry Needling: Subsequent Did patient give consent to treatment with Trigger Point Dry Needling:  Yes TPDN with skilled palpation and monitoring followed by STM to the following muscles: VL, TFL on Left  IASTM ITB Lt ankle AP talocrural mobs at end range DF Ionto Lt femoral head bursa- 6hr wear, 1CC dexamethasone.  PRI: Rt Sidelying clams- feet pressed into wall for LE 90/90, ball bw ankles and added adduction of Rt knee PRI- all 4 belly lift walk    PATIENT EDUCATION: Education details: Anatomy of condition, POC, HEP, exercise form/rationale Person educated: Patient Education method: Explanation and Verbal cues Education comprehension: verbalized understanding and needs further education   HOME EXERCISE PROGRAM: Previous HEP- [id number] PRI: Rt Sidelying clams- feet pressed into wall for LE 90/90, ball bw ankles and added adduction of Rt knee PRI: long seated supported press down with Right iliacus and psoas PRI: reverse door squat  ASSESSMENT:  CLINICAL IMPRESSION: Significant twitch response noted in left TFL as well as gluteus medius and minimus.  While this did result in some loosening of right shoulder dry needling was performed to left upper trap that was significantly tighter than the right.  This resulted in a decreased concordant tightness on the right side.  Added PRI exercises today to encourage thoracic mobility with breathing as well as continued gastroc lengthening.   OBJECTIVE IMPAIRMENTS decreased activity tolerance, decreased strength, increased muscle spasms, impaired UE functional use, improper body mechanics, postural dysfunction, and pain.   ACTIVITY LIMITATIONS carrying, lifting, standing, squatting, sleeping, stairs, and locomotion level  PARTICIPATION LIMITATIONS: driving, community activity, occupation, and yard work  PERSONAL FACTORS Time since onset of injury/illness/exacerbation and 1 comorbidity: general hypermobility  are also affecting patient's functional outcome.     GOALS: Goals reviewed with patient? Yes  SHORT TERM GOALS: Target date:  09/16/22  Will return to daily compliance with HEP Baseline: asked that he focus on 3 PRI exercises Goal status:ongoing   LONG TERM GOALS: Target date: POC date  Able to sleep without limitation by thigh pain Baseline: throbbing makes it difficult to sleep Goal status: INITIAL  2.  Able to return to pickle ball  Baseline: unable due to pain at eval Goal status: INITIAL  3.  GHJ ER strength to improve by 15% bilaterally  Baseline: see chart Goal status: INITIAL    PLAN: PT FREQUENCY: 1-2x/week  PT DURATION: 6 weeks  PLANNED INTERVENTIONS: Therapeutic exercises, Therapeutic activity, Neuromuscular re-education, Balance training, Patient/Family education, Self Care, Joint mobilization, Aquatic Therapy, Dry Needling, Electrical stimulation, Spinal mobilization, Cryotherapy, Moist heat, Taping, Traction, Ionotophoresis 4mg /ml Dexamethasone, Manual therapy, and Re-evaluation  PLAN FOR NEXT SESSION: DN PRN   Aralynn Brake C. Ladd Cen PT, DPT 09/28/22 1:20 PM

## 2022-10-02 ENCOUNTER — Encounter (HOSPITAL_BASED_OUTPATIENT_CLINIC_OR_DEPARTMENT_OTHER): Payer: Self-pay | Admitting: Physical Therapy

## 2022-10-02 ENCOUNTER — Ambulatory Visit (HOSPITAL_BASED_OUTPATIENT_CLINIC_OR_DEPARTMENT_OTHER): Payer: BC Managed Care – PPO | Admitting: Physical Therapy

## 2022-10-02 DIAGNOSIS — M25511 Pain in right shoulder: Secondary | ICD-10-CM | POA: Diagnosis not present

## 2022-10-02 DIAGNOSIS — G8929 Other chronic pain: Secondary | ICD-10-CM

## 2022-10-02 DIAGNOSIS — M79605 Pain in left leg: Secondary | ICD-10-CM

## 2022-10-02 NOTE — Therapy (Signed)
OUTPATIENT PHYSICAL THERAPY SHOULDER TREATMENT   Patient Name: Keith Montoya MRN: [id number] DOB:11-Apr-1976, 46 y.o., male Today's Date: 10/02/2022   PT End of Session - 10/02/22 1642     Visit Number 9    Number of Visits 13    Date for PT Re-Evaluation 10/07/22    Authorization Type BCBS 30 VL    PT Start Time 4098    Activity Tolerance Patient tolerated treatment well    Behavior During Therapy [facility] for tasks assessed/performed                History reviewed. No pertinent past medical history. History reviewed. No pertinent surgical history. There are no problems to display for this patient.   REFERRING PROVIDER: Laroy Apple, MD  REFERRING DIAG:  M25.511 (ICD-10-CM) - Right shoulder pain  M67.813 (ICD-10-CM) - Other specified disorders of tendon, right shoulder  M54.2 (ICD-10-CM) - Cervicalgia  M47.896 (ICD-10-CM) - Other spondylosis, lumbar region    THERAPY DIAG:  Chronic right shoulder pain  Pain in left leg  Chronic left-sided low back pain, unspecified whether sciatica present  Rationale for Evaluation and Treatment Rehabilitation  ONSET DATE: chronic  SUBJECTIVE:                                                                                                                                                                                      SUBJECTIVE STATEMENT: Left side feels better, not nearly as tight. A little tightnes in right upper trap without tightness in infraspinatus. Has not played pickle ball or tennis. Proximal groin pain and tightness in lateral thgiht.   PERTINENT HISTORY: Rt elbow surgery  PAIN:  Are you having pain? Yes: NPRS scale: significant- limits activity/10 Pain location: post Rt shoulder, Left, lateral thigh Pain description: tight Aggravating factors: trying to sleep, tennis Relieving factors: DN helped somewht this time  PRECAUTIONS: None  WEIGHT BEARING RESTRICTIONS No  FALLS:  Has patient fallen in  last 6 months? No  PLOF: Independent  PATIENT GOALS decrease pain, back to sports/fitness  OBJECTIVE:   POSTURE: Lateral tracking Lt patella in standing Superior Lt sacral quadrant anteriorly rotated on oblique axis  UPPER EXTREMITY ROM:   Gross hypermobility throughout body  EXTREMITY MMT:  MMT (lb) Right eval Left eval  Shoulder flexion    Shoulder extension    Shoulder abduction    Shoulder adduction    Shoulder internal rotation    Shoulder external rotation 23 22.3  Knee extension 57.9 59.2      (Blank rows = not tested)   JOINT MOBILITY TESTING:  hypermobility  PALPATION:  Spasm in Rt infraspinatus, lats, subscap; VMO, VL  TODAY'S TREATMENT:   Treatment                            10/02/22:  Trigger Point Dry Needling, Manual Therapy Treatment:  Initial or subsequent education regarding Trigger Point Dry Needling: Subsequent Did patient give consent to treatment with Trigger Point Dry Needling: Yes TPDN with skilled palpation and monitoring followed by STM to the following muscles: right pectineus Standing hip flexor stretch adjusted for pectineus stretch PRI all 4 belly lift walk x3 rounds  Standing postural alignment change- pelvic drift to the right with right rib cage depression cued by PT PRI Right sidelying knee toward knee x3 rounds Standing posture left foot slightly back    Treatment                            09/28/22:  Trigger Point Dry Needling, Manual Therapy Treatment:  Initial or subsequent education regarding Trigger Point Dry Needling: Subsequent Did patient give consent to treatment with Trigger Point Dry Needling: Yes TPDN with skilled palpation and monitoring followed by STM to the following muscles: Lt TFL, glut med/min; Left upper trap & levator scap   Prone Lt first rib depression, prone rib & thoracic mobs with mid thoracic Rt to Lt rotational mobilziation PRI reverse door squat Lt TFL stretch   Treatment                             09/25/22:  Trigger Point Dry Needling, Manual Therapy Treatment:  Initial or subsequent education regarding Trigger Point Dry Needling: Subsequent Did patient give consent to treatment with Trigger Point Dry Needling: Yes TPDN with skilled palpation and monitoring followed by STM to the following mYesuscles: Rt upper trap, left infraspinatus; Lt VL & VMO  IASTM- Rt Lat & quads; stm and trigger point release in Rt periscapular region & into infraspinatus    PATIENT EDUCATION: Education details: Anatomy of condition, POC, HEP, exercise form/rationale Person educated: Patient Education method: Explanation and Verbal cues Education comprehension: verbalized understanding and needs further education   HOME EXERCISE PROGRAM: Previous HEP- [id number] PRI: Rt Sidelying clams- feet pressed into wall for LE 90/90, ball bw ankles and added adduction of Rt knee PRI: long seated supported press down with Right iliacus and psoas PRI: reverse door squat Resting stance with left foot slightly back  ASSESSMENT:  CLINICAL IMPRESSION: Tends to stand with pelvic drift to the left in standng with right shoulder depression.  Passive shoulder IR improved following activation of left hip abductors and right obliques.  Trigger point and pectineus reduced tightness down the lateral leg and will work on standing with left foot slightly back to keep activation through left hip abductors rather than adductors.   OBJECTIVE IMPAIRMENTS decreased activity tolerance, decreased strength, increased muscle spasms, impaired UE functional use, improper body mechanics, postural dysfunction, and pain.   ACTIVITY LIMITATIONS carrying, lifting, standing, squatting, sleeping, stairs, and locomotion level  PARTICIPATION LIMITATIONS: driving, community activity, occupation, and yard work  PERSONAL FACTORS Time since onset of injury/illness/exacerbation and 1 comorbidity: general hypermobility  are also affecting  patient's functional outcome.     GOALS: Goals reviewed with patient? Yes  SHORT TERM GOALS: Target date: 09/16/22  Will return to daily compliance with HEP Baseline: asked that he focus on 3 PRI exercises Goal status:met   LONG TERM GOALS: Target date: POC  date  Able to sleep without limitation by thigh pain Baseline: throbbing makes it difficult to sleep Goal status: INITIAL  2.  Able to return to pickle ball Baseline: unable due to pain at eval Goal status: INITIAL  3.  GHJ ER strength to improve by 15% bilaterally  Baseline: see chart Goal status: INITIAL    PLAN: PT FREQUENCY: 1-2x/week  PT DURATION: 6 weeks  PLANNED INTERVENTIONS: Therapeutic exercises, Therapeutic activity, Neuromuscular re-education, Balance training, Patient/Family education, Self Care, Joint mobilization, Aquatic Therapy, Dry Needling, Electrical stimulation, Spinal mobilization, Cryotherapy, Moist heat, Taping, Traction, Ionotophoresis 74m/ml Dexamethasone, Manual therapy, and Re-evaluation  PLAN FOR NEXT SESSION: DN PRN   Aikam Vinje C. Meara Wiechman PT, DPT 10/02/22 4:43 PM

## 2022-10-04 ENCOUNTER — Encounter (HOSPITAL_BASED_OUTPATIENT_CLINIC_OR_DEPARTMENT_OTHER): Payer: Self-pay | Admitting: Physical Therapy

## 2022-10-04 ENCOUNTER — Ambulatory Visit (HOSPITAL_BASED_OUTPATIENT_CLINIC_OR_DEPARTMENT_OTHER): Payer: BC Managed Care – PPO | Admitting: Physical Therapy

## 2022-10-04 DIAGNOSIS — M79605 Pain in left leg: Secondary | ICD-10-CM

## 2022-10-04 DIAGNOSIS — M25511 Pain in right shoulder: Secondary | ICD-10-CM | POA: Diagnosis not present

## 2022-10-04 DIAGNOSIS — G8929 Other chronic pain: Secondary | ICD-10-CM

## 2022-10-04 NOTE — Therapy (Signed)
OUTPATIENT PHYSICAL THERAPY SHOULDER TREATMENT   Patient Name: Keith Montoya MRN: [id number] DOB:25-Sep-1976, 46 y.o., male Today's Date: 10/04/2022   PT End of Session - 10/04/22 0753     Visit Number 10    Number of Visits 13    Date for PT Re-Evaluation 10/07/22    Authorization Type BCBS 30 VL    PT Start Time 0750    PT Stop Time 0834    PT Time Calculation (min) 44 min    Activity Tolerance Patient tolerated treatment well    Behavior During Therapy [facility] for tasks assessed/performed                History reviewed. No pertinent past medical history. History reviewed. No pertinent surgical history. There are no problems to display for this patient.   REFERRING PROVIDER: Laroy Apple, MD  REFERRING DIAG:  M25.511 (ICD-10-CM) - Right shoulder pain  M67.813 (ICD-10-CM) - Other specified disorders of tendon, right shoulder  M54.2 (ICD-10-CM) - Cervicalgia  M47.896 (ICD-10-CM) - Other spondylosis, lumbar region    THERAPY DIAG:  Chronic right shoulder pain  Pain in left leg  Chronic left-sided low back pain, unspecified whether sciatica present  Rationale for Evaluation and Treatment Rehabilitation  ONSET DATE: chronic  SUBJECTIVE:                                                                                                                                                                                      SUBJECTIVE STATEMENT: Did not do much standing yesterday.   PERTINENT HISTORY: Rt elbow surgery  PAIN:  Are you having pain? Yes: NPRS scale: significant- limits activity/10 Pain location: post Rt shoulder, Left, lateral thigh Pain description: tight Aggravating factors: trying to sleep, tennis Relieving factors: DN helped somewht this time  PRECAUTIONS: None  WEIGHT BEARING RESTRICTIONS No  FALLS:  Has patient fallen in last 6 months? No  PLOF: Independent  PATIENT GOALS decrease pain, back to sports/fitness  OBJECTIVE:    POSTURE: Lateral tracking Lt patella in standing Superior Lt sacral quadrant anteriorly rotated on oblique axis  UPPER EXTREMITY ROM:   Gross hypermobility throughout body  EXTREMITY MMT:  MMT (lb) Right eval Left eval  Shoulder flexion    Shoulder extension    Shoulder abduction    Shoulder adduction    Shoulder internal rotation    Shoulder external rotation 23 22.3  Knee extension 57.9 59.2      (Blank rows = not tested)   JOINT MOBILITY TESTING:  hypermobility  PALPATION:  Spasm in Rt infraspinatus, lats, subscap; VMO, VL   TODAY'S TREATMENT:   Treatment  10/04/22:  Trigger Point Dry Needling, Manual Therapy Treatment:  Initial or subsequent education regarding Trigger Point Dry Needling: Subsequent Did patient give consent to treatment with Trigger Point Dry Needling: Yes TPDN with skilled palpation and monitoring followed by STM to the following muscles: Rt post deltoid, infraspinatus  Ktape for scapular retraciton & depression IASTM distal ITB Supine ITB stretch with strap   Treatment                            10/02/22:  Trigger Point Dry Needling, Manual Therapy Treatment:  Initial or subsequent education regarding Trigger Point Dry Needling: Subsequent Did patient give consent to treatment with Trigger Point Dry Needling: Yes TPDN with skilled palpation and monitoring followed by STM to the following muscles: right pectineus Standing hip flexor stretch adjusted for pectineus stretch PRI all 4 belly lift walk x3 rounds  Standing postural alignment change- pelvic drift to the right with right rib cage depression cued by PT PRI Right sidelying knee toward knee x3 rounds Standing posture left foot slightly back    Treatment                            09/28/22:  Trigger Point Dry Needling, Manual Therapy Treatment:  Initial or subsequent education regarding Trigger Point Dry Needling: Subsequent Did patient give consent  to treatment with Trigger Point Dry Needling: Yes TPDN with skilled palpation and monitoring followed by STM to the following muscles: Lt TFL, glut med/min; Left upper trap & levator scap   Prone Lt first rib depression, prone rib & thoracic mobs with mid thoracic Rt to Lt rotational mobilziation PRI reverse door squat Lt TFL stretch   Treatment                            09/25/22:  Trigger Point Dry Needling, Manual Therapy Treatment:  Initial or subsequent education regarding Trigger Point Dry Needling: Subsequent Did patient give consent to treatment with Trigger Point Dry Needling: Yes TPDN with skilled palpation and monitoring followed by STM to the following mYesuscles: Rt upper trap, left infraspinatus; Lt VL & VMO  IASTM- Rt Lat & quads; stm and trigger point release in Rt periscapular region & into infraspinatus    PATIENT EDUCATION: Education details: Anatomy of condition, POC, HEP, exercise form/rationale Person educated: Patient Education method: Explanation and Verbal cues Education comprehension: verbalized understanding and needs further education   HOME EXERCISE PROGRAM: Previous HEP- [id number] PRI: Rt Sidelying clams- feet pressed into wall for LE 90/90, ball bw ankles and added adduction of Rt knee PRI: long seated supported press down with Right iliacus and psoas PRI: reverse door squat Resting stance with left foot slightly back  ASSESSMENT:  CLINICAL IMPRESSION: Significant twitch response in right infraspinatus with dry needling today.  Distal posterior IT band is tender to palpation but does not appear to be trigger points through quads or hamstrings utilized IASTM along this area.  Patient reported feeling looser following appointment today and will focus on PRI exercises over the holiday and try to avoid length and forward reaching positions through right upper extremity.   OBJECTIVE IMPAIRMENTS decreased activity tolerance, decreased strength, increased  muscle spasms, impaired UE functional use, improper body mechanics, postural dysfunction, and pain.   ACTIVITY LIMITATIONS carrying, lifting, standing, squatting, sleeping, stairs, and locomotion level  PARTICIPATION LIMITATIONS: driving, community activity,  occupation, and yard work  PERSONAL FACTORS Time since onset of injury/illness/exacerbation and 1 comorbidity: general hypermobility  are also affecting patient's functional outcome.     GOALS: Goals reviewed with patient? Yes  SHORT TERM GOALS: Target date: 09/16/22  Will return to daily compliance with HEP Baseline: asked that he focus on 3 PRI exercises Goal status:met   LONG TERM GOALS: Target date: POC date  Able to sleep without limitation by thigh pain Baseline: throbbing makes it difficult to sleep Goal status: INITIAL  2.  Able to return to pickle ball Baseline: unable due to pain at eval Goal status: INITIAL  3.  GHJ ER strength to improve by 15% bilaterally  Baseline: see chart Goal status: INITIAL    PLAN: PT FREQUENCY: 1-2x/week  PT DURATION: 6 weeks  PLANNED INTERVENTIONS: Therapeutic exercises, Therapeutic activity, Neuromuscular re-education, Balance training, Patient/Family education, Self Care, Joint mobilization, Aquatic Therapy, Dry Needling, Electrical stimulation, Spinal mobilization, Cryotherapy, Moist heat, Taping, Traction, Ionotophoresis 55m/ml Dexamethasone, Manual therapy, and Re-evaluation  PLAN FOR NEXT SESSION: DN PRN, extend cClyman Phoua Hoadley PT, DPT 10/04/22 8:39 AM

## 2022-10-12 ENCOUNTER — Encounter (HOSPITAL_BASED_OUTPATIENT_CLINIC_OR_DEPARTMENT_OTHER): Payer: Self-pay | Admitting: Physical Therapy

## 2022-10-12 ENCOUNTER — Ambulatory Visit (HOSPITAL_BASED_OUTPATIENT_CLINIC_OR_DEPARTMENT_OTHER): Payer: BC Managed Care – PPO | Admitting: Physical Therapy

## 2022-10-12 DIAGNOSIS — M79605 Pain in left leg: Secondary | ICD-10-CM

## 2022-10-12 DIAGNOSIS — M545 Low back pain, unspecified: Secondary | ICD-10-CM

## 2022-10-12 DIAGNOSIS — M25511 Pain in right shoulder: Secondary | ICD-10-CM | POA: Diagnosis not present

## 2022-10-12 DIAGNOSIS — G8929 Other chronic pain: Secondary | ICD-10-CM

## 2022-10-12 NOTE — Therapy (Signed)
OUTPATIENT PHYSICAL THERAPY SHOULDER TREATMENT   Patient Name: Keith Montoya MRN: [id number] DOB:31-May-1976, 46 y.o., male Today's Date: 10/12/2022   PT End of Session - 10/12/22 1648     Visit Number 11    Number of Visits 19    Date for PT Re-Evaluation 11/10/22    Authorization Type [employer]BS 30 VL    PT Start Time 9937    PT Stop Time 1726    PT Time Calculation (min) 41 min    Activity Tolerance Patient tolerated treatment well    Behavior During Therapy [facility] for tasks assessed/performed                 History reviewed. No pertinent past medical history. History reviewed. No pertinent surgical history. There are no problems to display for this patient.   REFERRING PROVIDER: Laroy Apple, MD  REFERRING DIAG:  M25.511 (ICD-10-CM) - Right shoulder pain  M67.813 (ICD-10-CM) - Other specified disorders of tendon, right shoulder  M54.2 (ICD-10-CM) - Cervicalgia  M47.896 (ICD-10-CM) - Other spondylosis, lumbar region    THERAPY DIAG:  Chronic right shoulder pain  Pain in left leg  Chronic left-sided low back pain, unspecified whether sciatica present  Rationale for Evaluation and Treatment Rehabilitation  ONSET DATE: chronic  SUBJECTIVE:                                                                                                                                                                                      SUBJECTIVE STATEMENT: Being referred to rheumatologist. I feel like the hip has been better. Played for about 1.5 hr controlled and didn't feel very limber. Since then I have felt better. I have been at a computer for 9 hrs so my shoulder is a little tight. Have not had the leg as much when I am getting up.   PERTINENT HISTORY: Rt elbow surgery  PAIN:  Are you having pain? Yes: NPRS scale: 3-4/10 Pain location: post Rt shoulder; Left, lateral thigh Pain description: tight Aggravating factors: trying to sleep, tennis Relieving factors: DN  helped somewht this time  PRECAUTIONS: None  WEIGHT BEARING RESTRICTIONS No  FALLS:  Has patient fallen in last 6 months? No  PLOF: Independent  PATIENT GOALS decrease pain, back to sports/fitness  OBJECTIVE:   POSTURE: Lateral tracking Lt patella in standing Superior Lt sacral quadrant anteriorly rotated on oblique axis  UPPER EXTREMITY ROM:   Gross hypermobility throughout body  EXTREMITY MMT:  MMT (lb) Right eval Left eval Rt/Lt 11/30  Shoulder flexion     Shoulder extension     Shoulder abduction     Shoulder adduction     Shoulder internal  rotation     Shoulder external rotation 23 22.3 27.2 / 23.1  Knee extension 57.9 59.2 64.6 / 65.2       (Blank rows = not tested)   JOINT MOBILITY TESTING:  hypermobility  PALPATION:  Spasm in Rt infraspinatus, lats, subscap; VMO, VL   TODAY'S TREATMENT:   Treatment                            10/12/22:  PRI all 4 belly lift walk x3 rounds Horiz abd red tband with scapular control ER eccentric with red tband from 90/90, tactile support required- anterior to elbow, post to GHJ, retraction to scapula  ER posture without resist and with isometric hold- mirror for VC   Treatment                            10/04/22:  Trigger Point Dry Needling, Manual Therapy Treatment:  Initial or subsequent education regarding Trigger Point Dry Needling: Subsequent Did patient give consent to treatment with Trigger Point Dry Needling: Yes TPDN with skilled palpation and monitoring followed by STM to the following muscles: Rt post deltoid, infraspinatus  Ktape for scapular retraciton & depression IASTM distal ITB Supine ITB stretch with strap   Treatment                            10/02/22:  Trigger Point Dry Needling, Manual Therapy Treatment:  Initial or subsequent education regarding Trigger Point Dry Needling: Subsequent Did patient give consent to treatment with Trigger Point Dry Needling: Yes TPDN with skilled palpation  and monitoring followed by STM to the following muscles: right pectineus Standing hip flexor stretch adjusted for pectineus stretch PRI all 4 belly lift walk x3 rounds  Standing postural alignment change- pelvic drift to the right with right rib cage depression cued by PT PRI Right sidelying knee toward knee x3 rounds Standing posture left foot slightly back     PATIENT EDUCATION: Education details: Anatomy of condition, POC, HEP, exercise form/rationale Person educated: Patient Education method: Explanation and Verbal cues Education comprehension: verbalized understanding and needs further education   HOME EXERCISE PROGRAM: Previous HEP- [id number] PRI: Rt Sidelying clams- feet pressed into wall for LE 90/90, ball bw ankles and added adduction of Rt knee PRI: long seated supported press down with Right iliacus and psoas PRI: reverse door squat PRI all 4 belly lift walk Resting stance with left foot slightly back  ASSESSMENT:  CLINICAL IMPRESSION: Assistance required to avoid anterior shift of proximal humerus in abduction and flexion. Able to feel left thigh pain with compensation patterns- will continue to work on this.    OBJECTIVE IMPAIRMENTS decreased activity tolerance, decreased strength, increased muscle spasms, impaired UE functional use, improper body mechanics, postural dysfunction, and pain.   ACTIVITY LIMITATIONS carrying, lifting, standing, squatting, sleeping, stairs, and locomotion level  PARTICIPATION LIMITATIONS: driving, community activity, occupation, and yard work  PERSONAL FACTORS Time since onset of injury/illness/exacerbation and 1 comorbidity: general hypermobility  are also affecting patient's functional outcome.     GOALS: Goals reviewed with patient? Yes  SHORT TERM GOALS: Target date: 09/16/22  Will return to daily compliance with HEP Baseline: asked that he focus on 3 PRI exercises Goal status:met   LONG TERM GOALS: Target date: POC  date  Able to sleep without limitation by thigh pain Baseline: throbbing makes it difficult  to sleep Goal status: INITIAL  2.  Able to return to pickle ball Baseline: unable due to pain at eval Goal status: INITIAL  3.  GHJ ER strength to improve by 15% bilaterally  Baseline: see chart Goal status: INITIAL    PLAN: PT FREQUENCY: 1-2x/week  PT DURATION: 6 weeks  PLANNED INTERVENTIONS: Therapeutic exercises, Therapeutic activity, Neuromuscular re-education, Balance training, Patient/Family education, Self Care, Joint mobilization, Aquatic Therapy, Dry Needling, Electrical stimulation, Spinal mobilization, Cryotherapy, Moist heat, Taping, Traction, Ionotophoresis 6m/ml Dexamethasone, Manual therapy, and Re-evaluation  PLAN FOR NEXT SESSION: DN PRN   Ita Fritzsche C. Suhaib Guzzo PT, DPT 10/12/22 7:35 PM

## 2022-10-24 ENCOUNTER — Ambulatory Visit (HOSPITAL_BASED_OUTPATIENT_CLINIC_OR_DEPARTMENT_OTHER): Payer: BC Managed Care – PPO | Attending: Physical Medicine and Rehabilitation | Admitting: Physical Therapy

## 2022-10-24 ENCOUNTER — Encounter (HOSPITAL_BASED_OUTPATIENT_CLINIC_OR_DEPARTMENT_OTHER): Payer: Self-pay | Admitting: Physical Therapy

## 2022-10-24 DIAGNOSIS — G8929 Other chronic pain: Secondary | ICD-10-CM | POA: Diagnosis present

## 2022-10-24 DIAGNOSIS — M25511 Pain in right shoulder: Secondary | ICD-10-CM | POA: Insufficient documentation

## 2022-10-24 DIAGNOSIS — M79605 Pain in left leg: Secondary | ICD-10-CM | POA: Insufficient documentation

## 2022-10-24 DIAGNOSIS — M545 Low back pain, unspecified: Secondary | ICD-10-CM | POA: Diagnosis present

## 2022-10-24 NOTE — Therapy (Signed)
OUTPATIENT PHYSICAL THERAPY SHOULDER TREATMENT   Patient Name: Nathanuel Cabreja MRN: [id number] DOB:Mar 21, 1976, 46 y.o., male Today's Date: 10/24/2022   PT End of Session - 10/24/22 1018     Visit Number 12    Number of Visits 19    Date for PT Re-Evaluation 11/10/22    Authorization Type BCBS 30 VL    PT Start Time 0945    PT Stop Time 1017    PT Time Calculation (min) 32 min    Activity Tolerance Patient tolerated treatment well    Behavior During Therapy [facility] for tasks assessed/performed                  History reviewed. No pertinent past medical history. History reviewed. No pertinent surgical history. There are no problems to display for this patient.   REFERRING PROVIDER: Laroy Apple, MD  REFERRING DIAG:  M25.511 (ICD-10-CM) - Right shoulder pain  M67.813 (ICD-10-CM) - Other specified disorders of tendon, right shoulder  M54.2 (ICD-10-CM) - Cervicalgia  M47.896 (ICD-10-CM) - Other spondylosis, lumbar region    THERAPY DIAG:  Chronic right shoulder pain  Pain in left leg  Chronic left-sided low back pain, unspecified whether sciatica present  Rationale for Evaluation and Treatment Rehabilitation  ONSET DATE: chronic  SUBJECTIVE:                                                                                                                                                                                      SUBJECTIVE STATEMENT: Cue of pulling elbow to set shoulder is what works. Door PRI not working. I still feel weak through the back. Had to do a lot of lifting for a tournament and was able to do so without significant pain.   PERTINENT HISTORY: Rt elbow surgery  PAIN:  Are you having pain? Yes: NPRS scale: 3-4/10 Pain location: post Rt shoulder; Left, lateral thigh Pain description: tight Aggravating factors: trying to sleep, tennis Relieving factors: DN helped somewht this time  PRECAUTIONS: None  WEIGHT BEARING RESTRICTIONS  No  FALLS:  Has patient fallen in last 6 months? No  PLOF: Independent  PATIENT GOALS decrease pain, back to sports/fitness  OBJECTIVE:   POSTURE: Lateral tracking Lt patella in standing Superior Lt sacral quadrant anteriorly rotated on oblique axis  UPPER EXTREMITY ROM:   Gross hypermobility throughout body  EXTREMITY MMT:  MMT (lb) Right eval Left eval Rt/Lt 11/30  Shoulder flexion     Shoulder extension     Shoulder abduction     Shoulder adduction     Shoulder internal rotation     Shoulder external rotation 23 22.3 27.2 / 23.1  Knee extension  57.9 59.2 64.6 / 65.2       (Blank rows = not tested)   JOINT MOBILITY TESTING:  hypermobility  PALPATION:  Spasm in Rt infraspinatus, lats, subscap; VMO, VL   TODAY'S TREATMENT:   Treatment                            10/24/22:  Prone shoulder flexion from arms on table overhead Prone heel squeeze with knees flexed Prone Rt shoulder/left leg lift Prone W   Treatment                            10/12/22:  PRI all 4 belly lift walk x3 rounds Horiz abd red tband with scapular control ER eccentric with red tband from 90/90, tactile support required- anterior to elbow, post to GHJ, retraction to scapula  ER posture without resist and with isometric hold- mirror for VC   Treatment                            10/04/22:  Trigger Point Dry Needling, Manual Therapy Treatment:  Initial or subsequent education regarding Trigger Point Dry Needling: Subsequent Did patient give consent to treatment with Trigger Point Dry Needling: Yes TPDN with skilled palpation and monitoring followed by STM to the following muscles: Rt post deltoid, infraspinatus  Ktape for scapular retraciton & depression IASTM distal ITB Supine ITB stretch with strap   Treatment                            10/02/22:  Trigger Point Dry Needling, Manual Therapy Treatment:  Initial or subsequent education regarding Trigger Point Dry Needling:  Subsequent Did patient give consent to treatment with Trigger Point Dry Needling: Yes TPDN with skilled palpation and monitoring followed by STM to the following muscles: right pectineus Standing hip flexor stretch adjusted for pectineus stretch PRI all 4 belly lift walk x3 rounds  Standing postural alignment change- pelvic drift to the right with right rib cage depression cued by PT PRI Right sidelying knee toward knee x3 rounds Standing posture left foot slightly back     PATIENT EDUCATION: Education details: Anatomy of condition, POC, HEP, exercise form/rationale Person educated: Patient Education method: Explanation and Verbal cues Education comprehension: verbalized understanding and needs further education   HOME EXERCISE PROGRAM: Previous HEP- [id number] PRI: Rt Sidelying clams- feet pressed into wall for LE 90/90, ball bw ankles and added adduction of Rt knee PRI: long seated supported press down with Right iliacus and psoas PRI all 4 belly lift walk Resting stance with left foot slightly back  ASSESSMENT:  CLINICAL IMPRESSION: Prone work for thoraco-lumbar stability. Multiple cues for core engagement to reduce overuse of Rt lumbar paraspinals.     OBJECTIVE IMPAIRMENTS decreased activity tolerance, decreased strength, increased muscle spasms, impaired UE functional use, improper body mechanics, postural dysfunction, and pain.   ACTIVITY LIMITATIONS carrying, lifting, standing, squatting, sleeping, stairs, and locomotion level  PARTICIPATION LIMITATIONS: driving, community activity, occupation, and yard work  PERSONAL FACTORS Time since onset of injury/illness/exacerbation and 1 comorbidity: general hypermobility  are also affecting patient's functional outcome.     GOALS: Goals reviewed with patient? Yes  SHORT TERM GOALS: Target date: 09/16/22  Will return to daily compliance with HEP Baseline: asked that he focus on 3 PRI exercises Goal status:met  LONG  TERM GOALS: Target date: POC date  Able to sleep without limitation by thigh pain Baseline: throbbing makes it difficult to sleep Goal status: INITIAL  2.  Able to return to pickle ball Baseline: unable due to pain at eval Goal status: INITIAL  3.  GHJ ER strength to improve by 15% bilaterally  Baseline: see chart Goal status: INITIAL    PLAN: PT FREQUENCY: 1-2x/week  PT DURATION: 6 weeks  PLANNED INTERVENTIONS: Therapeutic exercises, Therapeutic activity, Neuromuscular re-education, Balance training, Patient/Family education, Self Care, Joint mobilization, Aquatic Therapy, Dry Needling, Electrical stimulation, Spinal mobilization, Cryotherapy, Moist heat, Taping, Traction, Ionotophoresis 63m/ml Dexamethasone, Manual therapy, and Re-evaluation  PLAN FOR NEXT SESSION: DN PRN   Christiane Sistare C. Greenly Rarick PT, DPT 10/24/22 12:05 PM

## 2022-10-26 ENCOUNTER — Ambulatory Visit (HOSPITAL_BASED_OUTPATIENT_CLINIC_OR_DEPARTMENT_OTHER): Payer: BC Managed Care – PPO | Admitting: Physical Therapy

## 2022-10-26 ENCOUNTER — Encounter (HOSPITAL_BASED_OUTPATIENT_CLINIC_OR_DEPARTMENT_OTHER): Payer: Self-pay | Admitting: Physical Therapy

## 2022-10-26 DIAGNOSIS — M79605 Pain in left leg: Secondary | ICD-10-CM

## 2022-10-26 DIAGNOSIS — G8929 Other chronic pain: Secondary | ICD-10-CM

## 2022-10-26 DIAGNOSIS — M25511 Pain in right shoulder: Secondary | ICD-10-CM | POA: Diagnosis not present

## 2022-10-26 NOTE — Therapy (Signed)
OUTPATIENT PHYSICAL THERAPY SHOULDER TREATMENT   Patient Name: Keith Montoya MRN: [id number] DOB:[birth year], 46 y.o., male Today's Date: 10/26/2022   PT End of Session - 10/26/22 1433     Visit Number 13    Number of Visits 19    Date for PT Re-Evaluation 11/10/22    Authorization Type [employer]BS 30 VL    PT Start Time 1432    PT Stop Time 1508    PT Time Calculation (min) 36 min    Activity Tolerance Patient tolerated treatment well    Behavior During Therapy [facility] for tasks assessed/performed                   History reviewed. No pertinent past medical history. History reviewed. No pertinent surgical history. There are no problems to display for this patient.   REFERRING PROVIDER: Laroy Apple, MD  REFERRING DIAG:  M25.511 (ICD-10-CM) - Right shoulder pain  M67.813 (ICD-10-CM) - Other specified disorders of tendon, right shoulder  M54.2 (ICD-10-CM) - Cervicalgia  M47.896 (ICD-10-CM) - Other spondylosis, lumbar region    THERAPY DIAG:  Chronic right shoulder pain  Pain in left leg  Rationale for Evaluation and Treatment Rehabilitation  ONSET DATE: chronic  SUBJECTIVE:                                                                                                                                                                                      SUBJECTIVE STATEMENT: No backsliding. Wasn't really sore after last visit.   PERTINENT HISTORY: Rt elbow surgery  PAIN:  Are you having pain? Yes: NPRS scale: 3-4/10 Pain location: post Rt shoulder; Left, lateral thigh Pain description: tight Aggravating factors: trying to sleep, tennis Relieving factors: DN helped somewht this time  PRECAUTIONS: None  WEIGHT BEARING RESTRICTIONS No  FALLS:  Has patient fallen in last 6 months? No  PLOF: Independent  PATIENT GOALS decrease pain, back to sports/fitness  OBJECTIVE:   POSTURE: Lateral tracking Lt patella in standing Superior Lt sacral quadrant  anteriorly rotated on oblique axis  UPPER EXTREMITY ROM:   Gross hypermobility throughout body  EXTREMITY MMT:  MMT (lb) Right eval Left eval Rt/Lt 11/30  Shoulder flexion     Shoulder extension     Shoulder abduction     Shoulder adduction     Shoulder internal rotation     Shoulder external rotation 23 22.3 27.2 / 23.1  Knee extension 57.9 59.2 64.6 / 65.2       (Blank rows = not tested)   JOINT MOBILITY TESTING:  hypermobility  PALPATION:  Spasm in Rt infraspinatus, lats, subscap; VMO, VL   TODAY'S TREATMENT:   Treatment  10/26/22:  Standing AROM IR/ER at 90 abd Rt UE D2 flx/ext- flexion across body, ext to 90 abd/er, 2lb Rt UE D1 with 2lb Ktape- anterior deltoid & pec minor in shortened position Supine D1 & D2 with trunk slgihtly elevated by table, 2lb Swing motion with 2lb low to high   Treatment                            10/24/22:  Prone shoulder flexion from arms on table overhead Prone heel squeeze with knees flexed Prone Rt shoulder/left leg lift Prone W   Treatment                            10/12/22:  PRI all 4 belly lift walk x3 rounds Horiz abd red tband with scapular control ER eccentric with red tband from 90/90, tactile support required- anterior to elbow, post to GHJ, retraction to scapula  ER posture without resist and with isometric hold- mirror for VC     PATIENT EDUCATION: Education details: Anatomy of condition, POC, HEP, exercise form/rationale Person educated: Patient Education method: Explanation and Verbal cues Education comprehension: verbalized understanding and needs further education   HOME EXERCISE PROGRAM: Previous HEP- [id number] PRI: Rt Sidelying clams- feet pressed into wall for LE 90/90, ball bw ankles and added adduction of Rt knee PRI: long seated supported press down with Right iliacus and psoas PRI all 4 belly lift walk Resting stance with left foot slightly  back  ASSESSMENT:  CLINICAL IMPRESSION:  Exercises reduced tightness in Rt infraspinatus without manual therapy- ktape helpful to decrease anterior translation of humoral head.     OBJECTIVE IMPAIRMENTS decreased activity tolerance, decreased strength, increased muscle spasms, impaired UE functional use, improper body mechanics, postural dysfunction, and pain.   ACTIVITY LIMITATIONS carrying, lifting, standing, squatting, sleeping, stairs, and locomotion level  PARTICIPATION LIMITATIONS: driving, community activity, occupation, and yard work  PERSONAL FACTORS Time since onset of injury/illness/exacerbation and 1 comorbidity: general hypermobility  are also affecting patient's functional outcome.     GOALS: Goals reviewed with patient? Yes  SHORT TERM GOALS: Target date: 09/16/22  Will return to daily compliance with HEP Baseline: asked that he focus on 3 PRI exercises Goal status:met   LONG TERM GOALS: Target date: POC date  Able to sleep without limitation by thigh pain Baseline: throbbing makes it difficult to sleep Goal status: INITIAL  2.  Able to return to pickle ball Baseline: unable due to pain at eval Goal status: INITIAL  3.  GHJ ER strength to improve by 15% bilaterally  Baseline: see chart Goal status: INITIAL    PLAN: PT FREQUENCY: 1-2x/week  PT DURATION: 6 weeks  PLANNED INTERVENTIONS: Therapeutic exercises, Therapeutic activity, Neuromuscular re-education, Balance training, Patient/Family education, Self Care, Joint mobilization, Aquatic Therapy, Dry Needling, Electrical stimulation, Spinal mobilization, Cryotherapy, Moist heat, Taping, Traction, Ionotophoresis 4mg/ml Dexamethasone, Manual therapy, and Re-evaluation  PLAN FOR NEXT SESSION: DN PRN   Jessica C. Hightower PT, DPT 10/26/22 3:09 PM 

## 2022-10-30 ENCOUNTER — Encounter (HOSPITAL_BASED_OUTPATIENT_CLINIC_OR_DEPARTMENT_OTHER): Payer: Self-pay | Admitting: Physical Therapy

## 2022-10-30 ENCOUNTER — Ambulatory Visit (HOSPITAL_BASED_OUTPATIENT_CLINIC_OR_DEPARTMENT_OTHER): Payer: BC Managed Care – PPO | Admitting: Physical Therapy

## 2022-10-30 DIAGNOSIS — M79605 Pain in left leg: Secondary | ICD-10-CM

## 2022-10-30 DIAGNOSIS — G8929 Other chronic pain: Secondary | ICD-10-CM

## 2022-10-30 DIAGNOSIS — M25511 Pain in right shoulder: Secondary | ICD-10-CM | POA: Diagnosis not present

## 2022-10-30 NOTE — Therapy (Signed)
OUTPATIENT PHYSICAL THERAPY SHOULDER TREATMENT   Patient Name: Keith Montoya MRN: [id number] DOB:December 25, 1975, 46 y.o., male Today's Date: 10/30/2022   PT End of Session - 10/30/22 1517     Visit Number 14    Number of Visits 19    Date for PT Re-Evaluation 11/10/22    Authorization Type [employer] 30 VL    PT Start Time 1515    PT Stop Time 1557    PT Time Calculation (min) 42 min    Activity Tolerance Patient tolerated treatment well    Behavior During Therapy [facility] for tasks assessed/performed                   History reviewed. No pertinent past medical history. History reviewed. No pertinent surgical history. There are no problems to display for this patient.   REFERRING PROVIDER: Laroy Apple, MD  REFERRING DIAG:  M25.511 (ICD-10-CM) - Right shoulder pain  M67.813 (ICD-10-CM) - Other specified disorders of tendon, right shoulder  M54.2 (ICD-10-CM) - Cervicalgia  M47.896 (ICD-10-CM) - Other spondylosis, lumbar region    THERAPY DIAG:  Chronic right shoulder pain  Pain in left leg  Rationale for Evaluation and Treatment Rehabilitation  ONSET DATE: chronic  SUBJECTIVE:                                                                                                                                                                                      SUBJECTIVE STATEMENT: Was ok playing pickle ball for the first 1-1.5 hr on thurs and then started feeling the leg as I fatigued. Woke up ok on Friday. Lots of yard work on sat and pain as I fatigued but was fine on Sunday. Keith Montoya was a good reminder.   PERTINENT HISTORY: Rt elbow surgery  PAIN:  Are you having pain? Yes: NPRS scale: 0 at rest, 5 palpating infraspinatus/10 Pain location: post Rt shoulder Pain description: tight Aggravating factors:  tennis, pickle ball Relieving factors: DN, exercises, pillow on anterior shoulder when laying prone   PRECAUTIONS: None  WEIGHT BEARING RESTRICTIONS No  FALLS:   Has patient fallen in last 6 months? No  PLOF: Independent  PATIENT GOALS decrease pain, back to sports/fitness  OBJECTIVE:   POSTURE: Lateral tracking Lt patella in standing Superior Lt sacral quadrant anteriorly rotated on oblique axis  UPPER EXTREMITY ROM:   Gross hypermobility throughout body  EXTREMITY MMT:  MMT (lb) Right eval Left eval Rt/Lt 11/30  Shoulder flexion     Shoulder extension     Shoulder abduction     Shoulder adduction     Shoulder internal rotation     Shoulder external rotation 23 22.3 27.2 /  23.1  Knee extension 57.9 59.2 64.6 / 65.2       (Blank rows = not tested)   JOINT MOBILITY TESTING:  hypermobility  PALPATION:  Spasm in Rt infraspinatus, lats, subscap; VMO, VL   TODAY'S TREATMENT:   Treatment                            21/18/23:  Standing D2 & D2 flexion/ext with kettle bell and resistance from cable machine overhead- added squat reach & trunk rotation IASTM distal Lt HS Supine HS & ITB stretch PRI all 4 belly lift walk Prone right shoulder flexion   Treatment                            10/26/22:  Standing AROM IR/ER at 90 abd Rt UE D2 flx/ext- flexion across body, ext to 90 abd/er, 2lb Rt UE D1 with 2lb Ktape- anterior deltoid & pec minor in shortened position Supine D1 & D2 with trunk slgihtly elevated by table, 2lb Swing motion with 2lb low to high   Treatment                            10/24/22:  Prone shoulder flexion from arms on table overhead Prone heel squeeze with knees flexed Prone Rt shoulder/left leg lift Prone W     PATIENT EDUCATION: Education details: Geophysicist/field seismologist of condition, POC, HEP, exercise form/rationale Person educated: Patient Education method: Explanation and Verbal cues Education comprehension: verbalized understanding and needs further education   HOME EXERCISE PROGRAM: Previous HEP- [id number] PRI: Rt Sidelying clams- feet pressed into wall for LE 90/90, ball bw ankles and added  adduction of Rt knee PRI: long seated supported press down with Right iliacus and psoas PRI all 4 belly lift walk Resting stance with left foot slightly back  ASSESSMENT:  CLINICAL IMPRESSION:  Increased range of motion and trunk rotation and diagonal motions with weight and resistance today while focusing on eccentric strengthening.  When placed in endrange flexion in prone on the table required tactile cueing and assistance to perform proper scapular retraction versus hyperextension through the lumbar spine.    OBJECTIVE IMPAIRMENTS decreased activity tolerance, decreased strength, increased muscle spasms, impaired UE functional use, improper body mechanics, postural dysfunction, and pain.   ACTIVITY LIMITATIONS carrying, lifting, standing, squatting, sleeping, stairs, and locomotion level  PARTICIPATION LIMITATIONS: driving, community activity, occupation, and yard work  PERSONAL FACTORS Time since onset of injury/illness/exacerbation and 1 comorbidity: general hypermobility  are also affecting patient's functional outcome.     GOALS: Goals reviewed with patient? Yes  SHORT TERM GOALS: Target date: 09/16/22  Will return to daily compliance with HEP Baseline: asked that he focus on 3 PRI exercises Goal status:met   LONG TERM GOALS: Target date: POC date  Able to sleep without limitation by thigh pain Baseline: throbbing makes it difficult to sleep Goal status: INITIAL  2.  Able to return to pickle ball Baseline: unable due to pain at eval Goal status: INITIAL  3.  GHJ ER strength to improve by 15% bilaterally  Baseline: see chart Goal status: INITIAL    PLAN: PT FREQUENCY: 1-2x/week  PT DURATION: 6 weeks  PLANNED INTERVENTIONS: Therapeutic exercises, Therapeutic activity, Neuromuscular re-education, Balance training, Patient/Family education, Self Care, Joint mobilization, Aquatic Therapy, Dry Needling, Electrical stimulation, Spinal mobilization, Cryotherapy,  Moist heat, Taping, Traction, Ionotophoresis 83m/ml Dexamethasone,  Manual therapy, and Re-evaluation  PLAN FOR NEXT SESSION: DN PRN, extended plan of care through the end of February.  Work on prone shoulder flexion applying posterior to anterior pressure on the left pelvis as needed for stability   Cariana Karge C. Kailena Lubas PT, DPT 10/30/22 3:58 PM

## 2022-11-01 ENCOUNTER — Encounter (HOSPITAL_BASED_OUTPATIENT_CLINIC_OR_DEPARTMENT_OTHER): Payer: Self-pay | Admitting: Physical Therapy

## 2022-11-07 ENCOUNTER — Encounter (HOSPITAL_BASED_OUTPATIENT_CLINIC_OR_DEPARTMENT_OTHER): Payer: Self-pay | Admitting: Physical Therapy

## 2022-11-07 ENCOUNTER — Ambulatory Visit (HOSPITAL_BASED_OUTPATIENT_CLINIC_OR_DEPARTMENT_OTHER): Payer: BC Managed Care – PPO | Admitting: Physical Therapy

## 2022-11-07 DIAGNOSIS — G8929 Other chronic pain: Secondary | ICD-10-CM

## 2022-11-07 DIAGNOSIS — M545 Low back pain, unspecified: Secondary | ICD-10-CM

## 2022-11-07 DIAGNOSIS — M25511 Pain in right shoulder: Secondary | ICD-10-CM | POA: Diagnosis not present

## 2022-11-07 DIAGNOSIS — M79605 Pain in left leg: Secondary | ICD-10-CM

## 2022-11-07 NOTE — Therapy (Signed)
OUTPATIENT PHYSICAL THERAPY SHOULDER TREATMENT   Patient Name: Keith Montoya MRN: [id number] DOB:1976/05/12, 46 y.o., male Today's Date: 11/07/2022   PT End of Session - 11/07/22 1016     Visit Number 15    Number of Visits --    Date for PT Re-Evaluation 02/02/23    Authorization Type BCBS 30 VL    PT Start Time 1015    PT Stop Time 1102    PT Time Calculation (min) 47 min    Activity Tolerance Patient tolerated treatment well    Behavior During Therapy [facility] for tasks assessed/performed                   History reviewed. No pertinent past medical history. History reviewed. No pertinent surgical history. There are no problems to display for this patient.   REFERRING PROVIDER: Laroy Apple, MD  REFERRING DIAG:  M25.511 (ICD-10-CM) - Right shoulder pain  M67.813 (ICD-10-CM) - Other specified disorders of tendon, right shoulder  M54.2 (ICD-10-CM) - Cervicalgia  M47.896 (ICD-10-CM) - Other spondylosis, lumbar region    THERAPY DIAG:  Chronic right shoulder pain  Pain in left leg  Chronic left-sided low back pain, unspecified whether sciatica present  Rationale for Evaluation and Treatment Rehabilitation  ONSET DATE: chronic  SUBJECTIVE:                                                                                                                                                                                      SUBJECTIVE STATEMENT: Skiing did not affect arm, the evening was the only time I had some left leg soreness but was fine the next day. A little tightness in shoulder and upper thigh but nothing annoying. Some tightness in lower back using it for stability. I feel like I have a hard time executing exercises independently and probably would not feel good if I did something athletci.   PERTINENT HISTORY: Rt elbow surgery  PAIN:  Are you having pain? Yes: NPRS scale: 0 at rest, 5 palpating infraspinatus/10 Pain location: post Rt shoulder Pain  description: tight Aggravating factors:  tennis, pickle ball Relieving factors: DN, exercises, pillow on anterior shoulder when laying prone   PRECAUTIONS: None  WEIGHT BEARING RESTRICTIONS No  FALLS:  Has patient fallen in last 6 months? No  PLOF: Independent  PATIENT GOALS decrease pain, back to sports/fitness  OBJECTIVE:   POSTURE: Lateral tracking Lt patella in standing Superior Lt sacral quadrant anteriorly rotated on oblique axis  UPPER EXTREMITY ROM:   Gross hypermobility throughout body  EXTREMITY MMT:  MMT (lb) Right eval Left eval Rt/Lt 11/30  Shoulder flexion     Shoulder extension  Shoulder abduction     Shoulder adduction     Shoulder internal rotation     Shoulder external rotation 23 22.3 27.2 / 23.1  Knee extension 57.9 59.2 64.6 / 65.2       (Blank rows = not tested)   JOINT MOBILITY TESTING:  hypermobility  PALPATION:  Spasm in Rt infraspinatus, lats, subscap; VMO, VL   TODAY'S TREATMENT:  Treatment                            11/07/22:  Pickle ball swing resisted yellow tband On floor- lower body rotation with legs over physioball Bosu knee ups with rotation Rebounder throw and pause Trigger Point Dry Needling, Manual Therapy Treatment:  Initial or subsequent education regarding Trigger Point Dry Needling: Subsequent Did patient give consent to treatment with Trigger Point Dry Needling: Yes TPDN with skilled palpation and monitoring followed by STM to the following muscles: Lt VL, VMO proximal 1/3  Prone core activation- cues to lfit ribs rather than tilt pelvis    Treatment                            21/18/23:  Standing D2 & D2 flexion/ext with kettle bell and resistance from cable machine overhead- added squat reach & trunk rotation IASTM distal Lt HS Supine HS & ITB stretch PRI all 4 belly lift walk Prone right shoulder flexion   Treatment                            10/26/22:  Standing AROM IR/ER at 90 abd Rt UE D2  flx/ext- flexion across body, ext to 90 abd/er, 2lb Rt UE D1 with 2lb Ktape- anterior deltoid & pec minor in shortened position Supine D1 & D2 with trunk slgihtly elevated by table, 2lb Swing motion with 2lb low to high   Treatment                            10/24/22:  Prone shoulder flexion from arms on table overhead Prone heel squeeze with knees flexed Prone Rt shoulder/left leg lift Prone W     PATIENT EDUCATION: Education details: Geophysicist/field seismologist of condition, POC, HEP, exercise form/rationale Person educated: Patient Education method: Explanation and Verbal cues Education comprehension: verbalized understanding and needs further education   HOME EXERCISE PROGRAM: Previous HEP- [id number] PRI: Rt Sidelying clams- feet pressed into wall for LE 90/90, ball bw ankles and added adduction of Rt knee PRI: long seated supported press down with Right iliacus and psoas PRI all 4 belly lift walk Resting stance with left foot slightly back  ASSESSMENT:  CLINICAL IMPRESSION:  Limited ability to rotate lower body to the left due to pulling on the right side. Increased speed demand on control through shoulder with yellow tband and pt demo improved control overall but still required cuing for corrections. Limited CKC stability through shoulders  & dominance through hip flexors when asking for activation of core on bosu knee lift.     OBJECTIVE IMPAIRMENTS decreased activity tolerance, decreased strength, increased muscle spasms, impaired UE functional use, improper body mechanics, postural dysfunction, and pain.   ACTIVITY LIMITATIONS carrying, lifting, standing, squatting, sleeping, stairs, and locomotion level  PARTICIPATION LIMITATIONS: driving, community activity, occupation, and yard work  PERSONAL FACTORS Time since onset of injury/illness/exacerbation and 1 comorbidity: general  hypermobility  are also affecting patient's functional outcome.     GOALS: Goals reviewed with patient?  Yes  SHORT TERM GOALS: Target date: 09/16/22  Will return to daily compliance with HEP Baseline: asked that he focus on 3 PRI exercises Goal status:met   LONG TERM GOALS: Target date: POC date  Able to sleep without limitation by thigh pain Baseline: throbbing makes it difficult to sleep Goal status: INITIAL  2.  Able to return to pickle ball Baseline: unable due to pain at eval Goal status: INITIAL  3.  GHJ ER strength to improve by 15% bilaterally  Baseline: see chart Goal status: INITIAL    PLAN: PT FREQUENCY: 1-2x/week  PT DURATION: 6 weeks  PLANNED INTERVENTIONS: Therapeutic exercises, Therapeutic activity, Neuromuscular re-education, Balance training, Patient/Family education, Self Care, Joint mobilization, Aquatic Therapy, Dry Needling, Electrical stimulation, Spinal mobilization, Cryotherapy, Moist heat, Taping, Traction, Ionotophoresis 73m/ml Dexamethasone, Manual therapy, and Re-evaluation  PLAN FOR NEXT SESSION: DN PRN. Work on prone shoulder flexion applying posterior to anterior pressure on the left pelvis as needed for stability   Ameria Sanjurjo C. Lillias Difrancesco PT, DPT 11/07/22 2:25 PM

## 2022-11-09 ENCOUNTER — Ambulatory Visit (HOSPITAL_BASED_OUTPATIENT_CLINIC_OR_DEPARTMENT_OTHER): Payer: BC Managed Care – PPO | Admitting: Physical Therapy

## 2022-11-09 ENCOUNTER — Encounter (HOSPITAL_BASED_OUTPATIENT_CLINIC_OR_DEPARTMENT_OTHER): Payer: Self-pay | Admitting: Physical Therapy

## 2022-11-09 DIAGNOSIS — M79605 Pain in left leg: Secondary | ICD-10-CM

## 2022-11-09 DIAGNOSIS — M25511 Pain in right shoulder: Secondary | ICD-10-CM | POA: Diagnosis not present

## 2022-11-09 DIAGNOSIS — G8929 Other chronic pain: Secondary | ICD-10-CM

## 2022-11-09 NOTE — Therapy (Signed)
OUTPATIENT PHYSICAL THERAPY SHOULDER TREATMENT   Patient Name: Keith Montoya MRN: [id number] DOB:Nov 10, 1976, 46 y.o., male Today's Date: 11/09/2022   PT End of Session - 11/09/22 1255     Visit Number 16    Date for PT Re-Evaluation 02/02/23    Authorization Type [employer]BS 30 VL    PT Start Time 1255    PT Stop Time 1341    PT Time Calculation (min) 46 min    Activity Tolerance Patient tolerated treatment well    Behavior During Therapy [facility] for tasks assessed/performed                    History reviewed. No pertinent past medical history. History reviewed. No pertinent surgical history. There are no problems to display for this patient.   REFERRING PROVIDER: Laroy Apple, MD  REFERRING DIAG:  M25.511 (ICD-10-CM) - Right shoulder pain  M67.813 (ICD-10-CM) - Other specified disorders of tendon, right shoulder  M54.2 (ICD-10-CM) - Cervicalgia  M47.896 (ICD-10-CM) - Other spondylosis, lumbar region    THERAPY DIAG:  Chronic right shoulder pain  Pain in left leg  Chronic left-sided low back pain, unspecified whether sciatica present  Rationale for Evaluation and Treatment Rehabilitation  ONSET DATE: chronic  SUBJECTIVE:                                                                                                                                                                                      SUBJECTIVE STATEMENT: Some tightness in shoulder but not bad.   PERTINENT HISTORY: Rt elbow surgery  PAIN:  Are you having pain? Yes: NPRS scale: 0 at rest, 5 palpating infraspinatus/10 Pain location: post Rt shoulder Pain description: tight Aggravating factors:  tennis, pickle ball Relieving factors: DN, exercises, pillow on anterior shoulder when laying prone   PRECAUTIONS: None  WEIGHT BEARING RESTRICTIONS No  FALLS:  Has patient fallen in last 6 months? No  PLOF: Independent  PATIENT GOALS decrease pain, back to sports/fitness  OBJECTIVE:    POSTURE: Lateral tracking Lt patella in standing Superior Lt sacral quadrant anteriorly rotated on oblique axis  UPPER EXTREMITY ROM:   Gross hypermobility throughout body  EXTREMITY MMT:  MMT (lb) Right eval Left eval Rt/Lt 11/30  Shoulder flexion     Shoulder extension     Shoulder abduction     Shoulder adduction     Shoulder internal rotation     Shoulder external rotation 23 22.3 27.2 / 23.1  Knee extension 57.9 59.2 64.6 / 65.2       (Blank rows = not tested)   JOINT MOBILITY TESTING:  hypermobility  PALPATION:  Spasm in Rt infraspinatus, lats,  subscap; VMO, VL   TODAY'S TREATMENT:  Treatment                            11/09/22:  Legs over physioball LTR STM to Rt lumbar paraspinals- hypertrophy from t11-L3 with trigger point at L3 Boat pose with shoulder flexion prog to UE diagonals Legs crossed with UE diag 1lb Yellow tband resisted high to low tennis serve Plank with Rt arm reach and Lt oblique activation   Treatment                            11/07/22:  Pickle ball swing resisted yellow tband On floor- lower body rotation with legs over physioball Bosu knee ups with rotation Rebounder throw and pause Trigger Point Dry Needling, Manual Therapy Treatment:  Initial or subsequent education regarding Trigger Point Dry Needling: Subsequent Did patient give consent to treatment with Trigger Point Dry Needling: Yes TPDN with skilled palpation and monitoring followed by STM to the following muscles: Lt VL, VMO proximal 1/3  Prone core activation- cues to lfit ribs rather than tilt pelvis    Treatment                            21/18/23:  Standing D2 & D2 flexion/ext with kettle bell and resistance from cable machine overhead- added squat reach & trunk rotation IASTM distal Lt HS Supine HS & ITB stretch PRI all 4 belly lift walk Prone right shoulder flexion      PATIENT EDUCATION: Education details: Anatomy of condition, POC, HEP, exercise  form/rationale Person educated: Patient Education method: Explanation and Verbal cues Education comprehension: verbalized understanding and needs further education   HOME EXERCISE PROGRAM: Previous HEP- [id number] PRI: Rt Sidelying clams- feet pressed into wall for LE 90/90, ball bw ankles and added adduction of Rt knee PRI: long seated supported press down with Right iliacus and psoas PRI all 4 belly lift walk Resting stance with left foot slightly back  ASSESSMENT:  CLINICAL IMPRESSION:  Tactile cues for elbow progression from shoulder extension. Lt obliques decreased activation noted along diagonal pattern. Pt did not require DN to reduce trigger points in thigh or shoulder today.     OBJECTIVE IMPAIRMENTS decreased activity tolerance, decreased strength, increased muscle spasms, impaired UE functional use, improper body mechanics, postural dysfunction, and pain.   ACTIVITY LIMITATIONS carrying, lifting, standing, squatting, sleeping, stairs, and locomotion level  PARTICIPATION LIMITATIONS: driving, community activity, occupation, and yard work  PERSONAL FACTORS Time since onset of injury/illness/exacerbation and 1 comorbidity: general hypermobility  are also affecting patient's functional outcome.     GOALS: Goals reviewed with patient? Yes  SHORT TERM GOALS: Target date: 09/16/22  Will return to daily compliance with HEP Baseline: asked that he focus on 3 PRI exercises Goal status:met   LONG TERM GOALS: Target date: POC date  Able to sleep without limitation by thigh pain Baseline: throbbing makes it difficult to sleep Goal status: INITIAL  2.  Able to return to pickle ball Baseline: unable due to pain at eval Goal status: INITIAL  3.  GHJ ER strength to improve by 15% bilaterally  Baseline: see chart Goal status: INITIAL    PLAN: PT FREQUENCY: 1-2x/week  PT DURATION: 6 weeks  PLANNED INTERVENTIONS: Therapeutic exercises, Therapeutic activity,  Neuromuscular re-education, Balance training, Patient/Family education, Self Care, Joint mobilization, Aquatic Therapy, Dry Needling,  Electrical stimulation, Spinal mobilization, Cryotherapy, Moist heat, Taping, Traction, Ionotophoresis 69m/ml Dexamethasone, Manual therapy, and Re-evaluation  PLAN FOR NEXT SESSION: DN PRN. Work on prone shoulder flexion applying posterior to anterior pressure on the left pelvis as needed for stability   Pola Furno C. Kennon Encinas PT, DPT 11/09/22 1:44 PM

## 2022-11-24 ENCOUNTER — Ambulatory Visit (HOSPITAL_BASED_OUTPATIENT_CLINIC_OR_DEPARTMENT_OTHER): Payer: BC Managed Care – PPO | Attending: Physical Medicine and Rehabilitation | Admitting: Physical Therapy

## 2022-11-24 ENCOUNTER — Encounter (HOSPITAL_BASED_OUTPATIENT_CLINIC_OR_DEPARTMENT_OTHER): Payer: Self-pay | Admitting: Physical Therapy

## 2022-11-24 DIAGNOSIS — M79605 Pain in left leg: Secondary | ICD-10-CM | POA: Insufficient documentation

## 2022-11-24 DIAGNOSIS — M545 Low back pain, unspecified: Secondary | ICD-10-CM | POA: Insufficient documentation

## 2022-11-24 DIAGNOSIS — M25511 Pain in right shoulder: Secondary | ICD-10-CM | POA: Insufficient documentation

## 2022-11-24 DIAGNOSIS — G8929 Other chronic pain: Secondary | ICD-10-CM | POA: Diagnosis present

## 2022-11-24 NOTE — Therapy (Signed)
OUTPATIENT PHYSICAL THERAPY SHOULDER TREATMENT   Patient Name: Keith Montoya MRN: [id number] DOB:[birth year], 47 y.o., male Today's Date: 11/24/2022   PT End of Session - 11/24/22 1145     Visit Number 17    Date for PT Re-Evaluation 02/02/23    Authorization Type [employer] 30 VL    PT Start Time 1145    PT Stop Time 1225    PT Time Calculation (min) 40 min    Activity Tolerance Patient tolerated treatment well    Behavior During Therapy [name] for tasks assessed/performed                    History reviewed. No pertinent past medical history. History reviewed. No pertinent surgical history. There are no problems to display for this patient.   REFERRING PROVIDER: Laroy Apple, MD  REFERRING DIAG:  M25.511 (ICD-10-CM) - Right shoulder pain  M67.813 (ICD-10-CM) - Other specified disorders of tendon, right shoulder  M54.2 (ICD-10-CM) - Cervicalgia  M47.896 (ICD-10-CM) - Other spondylosis, lumbar region    THERAPY DIAG:  Chronic right shoulder pain  Pain in left leg  Chronic left-sided low back pain, unspecified whether sciatica present  Rationale for Evaluation and Treatment Rehabilitation  ONSET DATE: chronic  SUBJECTIVE:                                                                                                                                                                                      SUBJECTIVE STATEMENT: I played pickle ball last Sunday and held up ok. Started to feel Lt leg as I got tired but it was not there the next day. I have not done my exercises this week and feeling general tightness through cervical-thoracic region.   PERTINENT HISTORY: Rt elbow surgery  PAIN:  Are you having pain? Yes: NPRS scale: 0/10 Pain location: post Rt shoulder Pain description: tight/stuck Aggravating factors:  tennis, pickle ball Relieving factors: DN, exercises, pillow on anterior shoulder when laying prone   PRECAUTIONS: None  WEIGHT BEARING  RESTRICTIONS No  FALLS:  Has patient fallen in last 6 months? No  PLOF: Independent  PATIENT GOALS decrease pain, back to sports/fitness  OBJECTIVE:   POSTURE: Lateral tracking Lt patella in standing Superior Lt sacral quadrant anteriorly rotated on oblique axis  UPPER EXTREMITY ROM:   Gross hypermobility throughout body  EXTREMITY MMT:  MMT (lb) Right eval Left eval Rt/Lt 11/30  Shoulder flexion     Shoulder extension     Shoulder abduction     Shoulder adduction     Shoulder internal rotation     Shoulder external rotation 23 22.3 27.2 / 23.1  Knee extension 57.9  59.2 64.6 / 65.2       (Blank rows = not tested)   JOINT MOBILITY TESTING:  hypermobility  PALPATION:  Spasm in Rt infraspinatus, lats, subscap; VMO, VL   TODAY'S TREATMENT:  Treatment                            11/24/22:  Trigger Point Dry Needling, Manual Therapy Treatment:  Initial or subsequent education regarding Trigger Point Dry Needling: Subsequent Did patient give consent to treatment with Trigger Point Dry Needling: Yes TPDN with skilled palpation and monitoring followed by STM to the following muscles: bill upper traps & levator  Gross rib and thoracic ER and PA mobs; STM to lower thoracic paraspinals Plank- elbow/knees Boat pose- added ball behind lower back, added shoulder flexion & UE D1, D2 Knee/hand planks with alt shoulder flexion, horiz abd   Treatment                            11/09/22:  Legs over physioball LTR STM to Rt lumbar paraspinals- hypertrophy from t11-L3 with trigger point at L3 Boat pose with shoulder flexion prog to UE diagonals Legs crossed with UE diag 1lb Yellow tband resisted high to low tennis serve Plank with Rt arm reach and Lt oblique activation   Treatment                            11/07/22:  Pickle ball swing resisted yellow tband On floor- lower body rotation with legs over physioball Bosu knee ups with rotation Rebounder throw and  pause Trigger Point Dry Needling, Manual Therapy Treatment:  Initial or subsequent education regarding Trigger Point Dry Needling: Subsequent Did patient give consent to treatment with Trigger Point Dry Needling: Yes TPDN with skilled palpation and monitoring followed by STM to the following muscles: Lt VL, VMO proximal 1/3  Prone core activation- cues to lfit ribs rather than tilt pelvis    PATIENT EDUCATION: Education details: Anatomy of condition, POC, HEP, exercise form/rationale Person educated: Patient Education method: Explanation and Verbal cues Education comprehension: verbalized understanding and needs further education   HOME EXERCISE PROGRAM: Previous HEP- [id number] PRI: Rt Sidelying clams- feet pressed into wall for LE 90/90, ball bw ankles and added adduction of Rt knee PRI: long seated supported press down with Right iliacus and psoas PRI all 4 belly lift walk Resting stance with left foot slightly back  ASSESSMENT:  CLINICAL IMPRESSION:  Tightness across bil upper traps and mid thoracic region indicating good midline use of postural musculature. Able to improve core use in boat pose to add UE motions with use of ball behind back. Significant difficulty in UE CKC control with obliques.      OBJECTIVE IMPAIRMENTS decreased activity tolerance, decreased strength, increased muscle spasms, impaired UE functional use, improper body mechanics, postural dysfunction, and pain.   ACTIVITY LIMITATIONS carrying, lifting, standing, squatting, sleeping, stairs, and locomotion level  PARTICIPATION LIMITATIONS: driving, community activity, occupation, and yard work  PERSONAL FACTORS Time since onset of injury/illness/exacerbation and 1 comorbidity: general hypermobility  are also affecting patient's functional outcome.     GOALS: Goals reviewed with patient? Yes  SHORT TERM GOALS: Target date: 09/16/22  Will return to daily compliance with HEP Baseline: asked that he  focus on 3 PRI exercises Goal status:met   LONG TERM GOALS: Target date: POC date  Able  to sleep without limitation by thigh pain Baseline: throbbing makes it difficult to sleep Goal status: INITIAL  2.  Able to return to pickle ball Baseline: unable due to pain at eval Goal status: INITIAL  3.  GHJ ER strength to improve by 15% bilaterally  Baseline: see chart Goal status: INITIAL    PLAN: PT FREQUENCY: 1-2x/week  PT DURATION: 6 weeks  PLANNED INTERVENTIONS: Therapeutic exercises, Therapeutic activity, Neuromuscular re-education, Balance training, Patient/Family education, Self Care, Joint mobilization, Aquatic Therapy, Dry Needling, Electrical stimulation, Spinal mobilization, Cryotherapy, Moist heat, Taping, Traction, Ionotophoresis 4mg /ml Dexamethasone, Manual therapy, and Re-evaluation  PLAN FOR NEXT SESSION: DN PRN. Work on prone shoulder flexion applying posterior to anterior pressure on the left pelvis as needed for stability   Quintarius Ferns C. Lilo Wallington PT, DPT 11/24/22 1:06 PM

## 2022-12-01 ENCOUNTER — Encounter (HOSPITAL_BASED_OUTPATIENT_CLINIC_OR_DEPARTMENT_OTHER): Payer: BC Managed Care – PPO | Admitting: Physical Therapy

## 2022-12-04 ENCOUNTER — Ambulatory Visit (HOSPITAL_BASED_OUTPATIENT_CLINIC_OR_DEPARTMENT_OTHER): Payer: BC Managed Care – PPO | Admitting: Physical Therapy

## 2022-12-11 ENCOUNTER — Ambulatory Visit (HOSPITAL_BASED_OUTPATIENT_CLINIC_OR_DEPARTMENT_OTHER): Payer: BC Managed Care – PPO | Admitting: Physical Therapy

## 2022-12-11 ENCOUNTER — Encounter (HOSPITAL_BASED_OUTPATIENT_CLINIC_OR_DEPARTMENT_OTHER): Payer: Self-pay | Admitting: Physical Therapy

## 2022-12-11 DIAGNOSIS — M79605 Pain in left leg: Secondary | ICD-10-CM

## 2022-12-11 DIAGNOSIS — M545 Low back pain, unspecified: Secondary | ICD-10-CM

## 2022-12-11 DIAGNOSIS — G8929 Other chronic pain: Secondary | ICD-10-CM

## 2022-12-11 DIAGNOSIS — M25511 Pain in right shoulder: Secondary | ICD-10-CM | POA: Diagnosis not present

## 2022-12-11 NOTE — Therapy (Signed)
OUTPATIENT PHYSICAL THERAPY SHOULDER TREATMENT   Patient Name: Zebulon Gantt MRN: [id number] DOB:1976/07/11, 47 y.o., male Today's Date: 12/11/2022   PT End of Session - 12/11/22 1555     Visit Number 18    Date for PT Re-Evaluation 02/02/23    Authorization Type BCBS 30 VL    PT Start Time 1555    PT Stop Time 1642    PT Time Calculation (min) 47 min    Activity Tolerance Patient tolerated treatment well    Behavior During Therapy [facility] for tasks assessed/performed                    History reviewed. No pertinent past medical history. History reviewed. No pertinent surgical history. There are no problems to display for this patient.   REFERRING PROVIDER: Laroy Apple, MD  REFERRING DIAG:  M25.511 (ICD-10-CM) - Right shoulder pain  M67.813 (ICD-10-CM) - Other specified disorders of tendon, right shoulder  M54.2 (ICD-10-CM) - Cervicalgia  M47.896 (ICD-10-CM) - Other spondylosis, lumbar region    THERAPY DIAG:  Chronic right shoulder pain  Pain in left leg  Chronic left-sided low back pain, unspecified whether sciatica present  Rationale for Evaluation and Treatment Rehabilitation  ONSET DATE: chronic  SUBJECTIVE:                                                                                                                                                                                      SUBJECTIVE STATEMENT: Traveled 2x and was sick. Did a lot of walking with only 1 incidence of Lt leg/hip pain that went away. Feel bunched up on Rt lower back.   PERTINENT HISTORY: Rt elbow surgery  PAIN:  Are you having pain? Yes: NPRS scale: 0/10 Pain location: post Rt shoulder Pain description: tight/stuck Aggravating factors:  tennis, pickle ball Relieving factors: DN, exercises, pillow on anterior shoulder when laying prone   PRECAUTIONS: None  WEIGHT BEARING RESTRICTIONS No  FALLS:  Has patient fallen in last 6 months? No  PLOF:  Independent  PATIENT GOALS decrease pain, back to sports/fitness  OBJECTIVE:   POSTURE: Lateral tracking Lt patella in standing Superior Lt sacral quadrant anteriorly rotated on oblique axis  UPPER EXTREMITY ROM:   Gross hypermobility throughout body  EXTREMITY MMT:  MMT (lb) Right eval Left eval Rt/Lt 11/30  Shoulder flexion     Shoulder extension     Shoulder abduction     Shoulder adduction     Shoulder internal rotation     Shoulder external rotation 23 22.3 27.2 / 23.1  Knee extension 57.9 59.2 64.6 / 65.2       (Blank rows = not  tested)   JOINT MOBILITY TESTING:  hypermobility  PALPATION:  Spasm in Rt infraspinatus, lats, subscap; VMO, VL   TODAY'S TREATMENT:  Treatment                            12/11/22:  MANUAL prone STM to Rt throacic paraspinals, QL; thoracic & rib mobs with prone distraction of Rt lumbar spine Prone horiz abd W Alt shoulder flexion from child pose Primal push up- first trA activation Boat pose with shoulder flexion- bil & alternating Seated alt horiz abd with trunk rotation- focusing on core activation to rotate rather than shoulder extension Ball roll up wall in ER reaching OH Body blade at 90/90 abd ER & OH Bent over Y body blade   Treatment                            11/24/22:  Trigger Point Dry Needling, Manual Therapy Treatment:  Initial or subsequent education regarding Trigger Point Dry Needling: Subsequent Did patient give consent to treatment with Trigger Point Dry Needling: Yes TPDN with skilled palpation and monitoring followed by STM to the following muscles: bill upper traps & levator  Gross rib and thoracic ER and PA mobs; STM to lower thoracic paraspinals Plank- elbow/knees Boat pose- added ball behind lower back, added shoulder flexion & UE D1, D2 Knee/hand planks with alt shoulder flexion, horiz abd   Treatment                            11/09/22:  Legs over physioball LTR STM to Rt lumbar paraspinals-  hypertrophy from t11-L3 with trigger point at L3 Boat pose with shoulder flexion prog to UE diagonals Legs crossed with UE diag 1lb Yellow tband resisted high to low tennis serve Plank with Rt arm reach and Lt oblique activation    PATIENT EDUCATION: Education details: Teacher, music of condition, POC, HEP, exercise form/rationale Person educated: Patient Education method: Explanation and Verbal cues Education comprehension: verbalized understanding and needs further education   HOME EXERCISE PROGRAM: Previous HEP- [id number] PRI: Rt Sidelying clams- feet pressed into wall for LE 90/90, ball bw ankles and added adduction of Rt knee PRI: long seated supported press down with Right iliacus and psoas PRI all 4 belly lift walk Resting stance with left foot slightly back  ASSESSMENT:  CLINICAL IMPRESSION:  Added shoulder flexion and trunk rotation with maintained retraction. Boat pose moved to feet on floor at hip flexors kicked in as soon as trunk rotation was added.   OBJECTIVE IMPAIRMENTS decreased activity tolerance, decreased strength, increased muscle spasms, impaired UE functional use, improper body mechanics, postural dysfunction, and pain.   ACTIVITY LIMITATIONS carrying, lifting, standing, squatting, sleeping, stairs, and locomotion level  PARTICIPATION LIMITATIONS: driving, community activity, occupation, and yard work  PERSONAL FACTORS Time since onset of injury/illness/exacerbation and 1 comorbidity: general hypermobility  are also affecting patient's functional outcome.     GOALS: Goals reviewed with patient? Yes  SHORT TERM GOALS: Target date: 09/16/22  Will return to daily compliance with HEP Baseline: asked that he focus on 3 PRI exercises Goal status:met   LONG TERM GOALS: Target date: POC date  Able to sleep without limitation by thigh pain Baseline: sleeping is improving, will be able to reassess when I start playing again Goal status: ongoing 1/29   2.   Able to return to pickle  ball Baseline: unable due to pain at eval Goal status: INITIAL  3.  GHJ ER strength to improve by 15% bilaterally  Baseline: see chart Goal status: INITIAL    PLAN: PT FREQUENCY: 1-2x/week  PT DURATION: 6 weeks  PLANNED INTERVENTIONS: Therapeutic exercises, Therapeutic activity, Neuromuscular re-education, Balance training, Patient/Family education, Self Care, Joint mobilization, Aquatic Therapy, Dry Needling, Electrical stimulation, Spinal mobilization, Cryotherapy, Moist heat, Taping, Traction, Ionotophoresis 4mg /ml Dexamethasone, Manual therapy, and Re-evaluation  PLAN FOR NEXT SESSION: DN PRN. Work on prone shoulder flexion applying posterior to anterior pressure on the left pelvis as needed for stability   Zanyla Klebba C. Ayinde Swim PT, DPT 12/11/22 5:35 PM

## 2022-12-14 ENCOUNTER — Encounter (HOSPITAL_BASED_OUTPATIENT_CLINIC_OR_DEPARTMENT_OTHER): Payer: BC Managed Care – PPO | Admitting: Physical Therapy

## 2022-12-18 ENCOUNTER — Encounter (HOSPITAL_BASED_OUTPATIENT_CLINIC_OR_DEPARTMENT_OTHER): Payer: Self-pay | Admitting: Physical Therapy

## 2022-12-18 ENCOUNTER — Ambulatory Visit (HOSPITAL_BASED_OUTPATIENT_CLINIC_OR_DEPARTMENT_OTHER): Payer: BC Managed Care – PPO | Attending: Physical Medicine and Rehabilitation | Admitting: Physical Therapy

## 2022-12-18 DIAGNOSIS — G8929 Other chronic pain: Secondary | ICD-10-CM | POA: Insufficient documentation

## 2022-12-18 DIAGNOSIS — M79605 Pain in left leg: Secondary | ICD-10-CM | POA: Diagnosis present

## 2022-12-18 DIAGNOSIS — M25511 Pain in right shoulder: Secondary | ICD-10-CM | POA: Insufficient documentation

## 2022-12-18 DIAGNOSIS — M545 Low back pain, unspecified: Secondary | ICD-10-CM | POA: Insufficient documentation

## 2022-12-18 NOTE — Therapy (Signed)
OUTPATIENT PHYSICAL THERAPY SHOULDER TREATMENT   Patient Name: Keith Montoya MRN: [id number] DOB:[birth year], 47 y.o., male Today's Date: 12/18/2022   PT End of Session - 12/18/22 1558     Visit Number 19    Date for PT Re-Evaluation 02/02/23    Authorization Type BCBS 30 VL    PT Start Time 1558    PT Stop Time 1640    PT Time Calculation (min) 42 min    Activity Tolerance Patient tolerated treatment well    Behavior During Therapy [facility] for tasks assessed/performed                     History reviewed. No pertinent past medical history. History reviewed. No pertinent surgical history. There are no problems to display for this patient.   REFERRING PROVIDER: Laroy Apple, MD  REFERRING DIAG:  M25.511 (ICD-10-CM) - Right shoulder pain  M67.813 (ICD-10-CM) - Other specified disorders of tendon, right shoulder  M54.2 (ICD-10-CM) - Cervicalgia  M47.896 (ICD-10-CM) - Other spondylosis, lumbar region    THERAPY DIAG:  Chronic right shoulder pain  Pain in left leg  Chronic left-sided low back pain, unspecified whether sciatica present  Rationale for Evaluation and Treatment Rehabilitation  ONSET DATE: chronic  SUBJECTIVE:                                                                                                                                                                                      SUBJECTIVE STATEMENT: Did not get to play so not particularly active.   PERTINENT HISTORY: Rt elbow surgery  PAIN:  Are you having pain? Yes: NPRS scale: 0/10 Pain location: post Rt shoulder Pain description: tight/stuck Aggravating factors:  tennis, pickle ball Relieving factors: DN, exercises, pillow on anterior shoulder when laying prone   PRECAUTIONS: None  WEIGHT BEARING RESTRICTIONS No  FALLS:  Has patient fallen in last 6 months? No  PLOF: Independent  PATIENT GOALS decrease pain, back to sports/fitness  OBJECTIVE:   POSTURE: Lateral  tracking Lt patella in standing Superior Lt sacral quadrant anteriorly rotated on oblique axis  UPPER EXTREMITY ROM:   Gross hypermobility throughout body  EXTREMITY MMT:  MMT (lb) Right eval Left eval Rt/Lt 11/30  Shoulder flexion     Shoulder extension     Shoulder abduction     Shoulder adduction     Shoulder internal rotation     Shoulder external rotation 23 22.3 27.2 / 23.1  Knee extension 57.9 59.2 64.6 / 65.2       (Blank rows = not tested)   JOINT MOBILITY TESTING:  hypermobility  PALPATION:  Spasm in Rt infraspinatus, lats, subscap; VMO,  VL   TODAY'S TREATMENT:  Treatment                            2/5:  Theract: pickleball/tennis motions in gym C-sit with alt UE flexion, horiz add across opp UE- cues for trunk rotation, alt UE horiz abd PRI- all 4 belly lift walk   Treatment                            12/11/22:  MANUAL prone STM to Rt throacic paraspinals, QL; thoracic & rib mobs with prone distraction of Rt lumbar spine Prone horiz abd W Alt shoulder flexion from child pose Primal push up- first trA activation Boat pose with shoulder flexion- bil & alternating Seated alt horiz abd with trunk rotation- focusing on core activation to rotate rather than shoulder extension Ball roll up wall in ER reaching OH Body blade at 90/90 abd ER & OH Bent over Y body blade   Treatment                            11/24/22:  Trigger Point Dry Needling, Manual Therapy Treatment:  Initial or subsequent education regarding Trigger Point Dry Needling: Subsequent Did patient give consent to treatment with Trigger Point Dry Needling: Yes TPDN with skilled palpation and monitoring followed by STM to the following muscles: bill upper traps & levator  Gross rib and thoracic ER and PA mobs; STM to lower thoracic paraspinals Plank- elbow/knees Boat pose- added ball behind lower back, added shoulder flexion & UE D1, D2 Knee/hand planks with alt shoulder flexion, horiz  abd   PATIENT EDUCATION: Education details: Geophysicist/field seismologist of condition, POC, HEP, exercise form/rationale Person educated: Patient Education method: Explanation and Verbal cues Education comprehension: verbalized understanding and needs further education   HOME EXERCISE PROGRAM: Previous HEP- [id number] PRI: Rt Sidelying clams- feet pressed into wall for LE 90/90, ball bw ankles and added adduction of Rt knee PRI: long seated supported press down with Right iliacus and psoas PRI all 4 belly lift walk Resting stance with left foot slightly back  ASSESSMENT:  CLINICAL IMPRESSION:  Notable that difficulty in rotating trunk and use abdominal musculature for power creation still present.  Went to the gym and utilized pickleball paddle and ball to create a more realistic setting and challenged the use of core versus hypermobility at the shoulders.  Patient reported that anterior shoulder pain did decrease as we practiced, but did still feel very minimal left hip/IT band pain as result of compensation of lack of core use.  Did tolerate more repetitions of C sit with core activation today without addition of hip flexors.  Did not require dry needling at the end of the visit today but will continue to monitor for need.  OBJECTIVE IMPAIRMENTS decreased activity tolerance, decreased strength, increased muscle spasms, impaired UE functional use, improper body mechanics, postural dysfunction, and pain.   ACTIVITY LIMITATIONS carrying, lifting, standing, squatting, sleeping, stairs, and locomotion level  PARTICIPATION LIMITATIONS: driving, community activity, occupation, and yard work  PERSONAL FACTORS Time since onset of injury/illness/exacerbation and 1 comorbidity: general hypermobility  are also affecting patient's functional outcome.     GOALS: Goals reviewed with patient? Yes  SHORT TERM GOALS: Target date: 09/16/22  Will return to daily compliance with HEP Baseline: asked that he focus on 3  PRI exercises Goal status:met  LONG TERM GOALS: Target date: POC date  Able to sleep without limitation by thigh pain Baseline: sleeping is improving, will be able to reassess when I start playing again Goal status: ongoing 1/29   2.  Able to return to pickle ball Baseline: unable due to pain at eval Goal status: INITIAL  3.  GHJ ER strength to improve by 15% bilaterally  Baseline: see chart Goal status: INITIAL    PLAN: PT FREQUENCY: 1-2x/week  PT DURATION: 6 weeks  PLANNED INTERVENTIONS: Therapeutic exercises, Therapeutic activity, Neuromuscular re-education, Balance training, Patient/Family education, Self Care, Joint mobilization, Aquatic Therapy, Dry Needling, Electrical stimulation, Spinal mobilization, Cryotherapy, Moist heat, Taping, Traction, Ionotophoresis 4mg /ml Dexamethasone, Manual therapy, and Re-evaluation  PLAN FOR NEXT SESSION: DN PRN. Work on prone shoulder flexion applying posterior to anterior pressure on the left pelvis as needed for stability   Urvi Imes C. Maday Guarino PT, DPT 12/18/22 4:41 PM

## 2022-12-25 ENCOUNTER — Ambulatory Visit (HOSPITAL_BASED_OUTPATIENT_CLINIC_OR_DEPARTMENT_OTHER): Payer: BC Managed Care – PPO | Admitting: Physical Therapy

## 2022-12-25 ENCOUNTER — Encounter (HOSPITAL_BASED_OUTPATIENT_CLINIC_OR_DEPARTMENT_OTHER): Payer: Self-pay | Admitting: Physical Therapy

## 2022-12-25 DIAGNOSIS — M79605 Pain in left leg: Secondary | ICD-10-CM

## 2022-12-25 DIAGNOSIS — M25511 Pain in right shoulder: Secondary | ICD-10-CM | POA: Diagnosis not present

## 2022-12-25 DIAGNOSIS — M545 Low back pain, unspecified: Secondary | ICD-10-CM

## 2022-12-25 DIAGNOSIS — G8929 Other chronic pain: Secondary | ICD-10-CM

## 2022-12-25 NOTE — Therapy (Signed)
OUTPATIENT PHYSICAL THERAPY SHOULDER TREATMENT   Patient Name: Keith Montoya MRN: [id number] DOB:May 13, 1976, 47 y.o., male Today's Date: 12/25/2022   PT End of Session - 12/25/22 1601     Visit Number 20    Date for PT Re-Evaluation 02/02/23    Authorization Type [employer]BS 30 VL    PT Start Time 1600    PT Stop Time 1640    PT Time Calculation (min) 40 min    Activity Tolerance Patient tolerated treatment well    Behavior During Therapy [facility] for tasks assessed/performed                     History reviewed. No pertinent past medical history. History reviewed. No pertinent surgical history. There are no problems to display for this patient.   REFERRING PROVIDER: Laroy Apple, MD  REFERRING DIAG:  M25.511 (ICD-10-CM) - Right shoulder pain  M67.813 (ICD-10-CM) - Other specified disorders of tendon, right shoulder  M54.2 (ICD-10-CM) - Cervicalgia  M47.896 (ICD-10-CM) - Other spondylosis, lumbar region    THERAPY DIAG:  Chronic right shoulder pain  Pain in left leg  Chronic left-sided low back pain, unspecified whether sciatica present  Rationale for Evaluation and Treatment Rehabilitation  ONSET DATE: chronic  SUBJECTIVE:                                                                                                                                                                                      SUBJECTIVE STATEMENT: Was a little sore but no lingering things. I think I am playing tennis for the first time today.  A lot of sitting in the car this weekend. The spot now that is the onset is (pointing) to Rt upper lumbar paraspinals.   PERTINENT HISTORY: Rt elbow surgery  PAIN:  Are you having pain? Yes: NPRS scale: 0/10 Pain location: post Rt shoulder Pain description: tight/stuck Aggravating factors:  tennis, pickle ball Relieving factors: DN, exercises, pillow on anterior shoulder when laying prone   PRECAUTIONS: None  WEIGHT BEARING RESTRICTIONS  No  FALLS:  Has patient fallen in last 6 months? No  PLOF: Independent  PATIENT GOALS decrease pain, back to sports/fitness  OBJECTIVE:   POSTURE: Lateral tracking Lt patella in standing Superior Lt sacral quadrant anteriorly rotated on oblique axis  UPPER EXTREMITY ROM:   Gross hypermobility throughout body  EXTREMITY MMT:  MMT (lb) Right eval Left eval Rt/Lt 11/30  Shoulder flexion     Shoulder extension     Shoulder abduction     Shoulder adduction     Shoulder internal rotation     Shoulder external rotation 23 22.3 27.2 / 23.1  Knee  extension 57.9 59.2 64.6 / 65.2       (Blank rows = not tested)   JOINT MOBILITY TESTING:  hypermobility  PALPATION:  Spasm in Rt infraspinatus, lats, subscap; VMO, VL   TODAY'S TREATMENT:    Treatment                            2/12:  Trigger Point Dry Needling, Manual Therapy Treatment:  Initial or subsequent education regarding Trigger Point Dry Needling: Subsequent Did patient give consent to treatment with Trigger Point Dry Needling: Yes TPDN with skilled palpation and monitoring followed by STM to the following muscles: Rt thoracic paraspinals over lower ribs  Chid pose Down dog Long sitting roll up and back with arms overhead- with anchored purple handle band from weight room Tennis serve & ecc return anchored by band- Rt foot at [employer]   Treatment                            2/5:  Theract: pickleball/tennis motions in gym C-sit with alt UE flexion, horiz add across opp UE- cues for trunk rotation, alt UE horiz abd PRI- all 4 belly lift walk   Treatment                            12/11/22:  MANUAL prone STM to Rt throacic paraspinals, QL; thoracic & rib mobs with prone distraction of Rt lumbar spine Prone horiz abd W Alt shoulder flexion from child pose Primal push up- first trA activation Boat pose with shoulder flexion- bil & alternating Seated alt horiz abd with trunk rotation- focusing on core  activation to rotate rather than shoulder extension Ball roll up wall in ER reaching OH Body blade at 90/90 abd ER & OH Bent over Y body blade   PATIENT EDUCATION: Education details: Geophysicist/field seismologist of condition, POC, HEP, exercise form/rationale Person educated: Patient Education method: Explanation and Verbal cues Education comprehension: verbalized understanding and needs further education   HOME EXERCISE PROGRAM: Previous HEP- [id number] PRI: Rt Sidelying clams- feet pressed into wall for LE 90/90, ball bw ankles and added adduction of Rt knee PRI: long seated supported press down with Right iliacus and psoas PRI all 4 belly lift walk Resting stance with left foot slightly back  ASSESSMENT:  CLINICAL IMPRESSION:  Concordant pain released with TPDN. Cont to challenge long axis, sport-specific activities with band which pt was able to do without incr in pain. Did add band to roll ups as he began feeling post right shoulder pain without assistance.   OBJECTIVE IMPAIRMENTS decreased activity tolerance, decreased strength, increased muscle spasms, impaired UE functional use, improper body mechanics, postural dysfunction, and pain.   ACTIVITY LIMITATIONS carrying, lifting, standing, squatting, sleeping, stairs, and locomotion level  PARTICIPATION LIMITATIONS: driving, community activity, occupation, and yard work  PERSONAL FACTORS Time since onset of injury/illness/exacerbation and 1 comorbidity: general hypermobility  are also affecting patient's functional outcome.     GOALS: Goals reviewed with patient? Yes  SHORT TERM GOALS: Target date: 09/16/22  Will return to daily compliance with HEP Baseline: asked that he focus on 3 PRI exercises Goal status:met   LONG TERM GOALS: Target date: POC date  Able to sleep without limitation by thigh pain Baseline: sleeping is improving, will be able to reassess when I start playing again Goal status: ongoing 1/29   2.  Able to return to  pickle ball Baseline: unable due to pain at eval Goal status: INITIAL  3.  GHJ ER strength to improve by 15% bilaterally  Baseline: see chart Goal status: INITIAL    PLAN: PT FREQUENCY: 1-2x/week  PT DURATION: 6 weeks  PLANNED INTERVENTIONS: Therapeutic exercises, Therapeutic activity, Neuromuscular re-education, Balance training, Patient/Family education, Self Care, Joint mobilization, Aquatic Therapy, Dry Needling, Electrical stimulation, Spinal mobilization, Cryotherapy, Moist heat, Taping, Traction, Ionotophoresis 45m/ml Dexamethasone, Manual therapy, and Re-evaluation  PLAN FOR NEXT SESSION: DN PRN. Upright balance with rotation, core.    Vincient Vanaman C. Arben Packman PT, DPT 12/25/22 5:01 PM

## 2023-01-01 ENCOUNTER — Ambulatory Visit (HOSPITAL_BASED_OUTPATIENT_CLINIC_OR_DEPARTMENT_OTHER): Payer: BC Managed Care – PPO | Admitting: Physical Therapy

## 2023-01-01 ENCOUNTER — Encounter (HOSPITAL_BASED_OUTPATIENT_CLINIC_OR_DEPARTMENT_OTHER): Payer: Self-pay | Admitting: Physical Therapy

## 2023-01-01 DIAGNOSIS — M79605 Pain in left leg: Secondary | ICD-10-CM

## 2023-01-01 DIAGNOSIS — M25511 Pain in right shoulder: Secondary | ICD-10-CM | POA: Diagnosis not present

## 2023-01-01 DIAGNOSIS — G8929 Other chronic pain: Secondary | ICD-10-CM

## 2023-01-01 NOTE — Therapy (Signed)
OUTPATIENT PHYSICAL THERAPY SHOULDER TREATMENT   Patient Name: Keith Montoya MRN: [id number] DOB:19-Dec-1975, 47 y.o., male Today's Date: 01/01/2023   PT End of Session - 01/01/23 1602     Visit Number 21    Date for PT Re-Evaluation 02/02/23    Authorization Type BCBS 30 VL    PT Start Time 1601    PT Stop Time 1644    PT Time Calculation (min) 43 min    Activity Tolerance Patient tolerated treatment well    Behavior During Therapy [facility] for tasks assessed/performed                      History reviewed. No pertinent past medical history. History reviewed. No pertinent surgical history. There are no problems to display for this patient.   REFERRING PROVIDER: Laroy Apple, MD  REFERRING DIAG:  M25.511 (ICD-10-CM) - Right shoulder pain  M67.813 (ICD-10-CM) - Other specified disorders of tendon, right shoulder  M54.2 (ICD-10-CM) - Cervicalgia  M47.896 (ICD-10-CM) - Other spondylosis, lumbar region    THERAPY DIAG:  Chronic right shoulder pain  Pain in left leg  Chronic left-sided low back pain, unspecified whether sciatica present  Rationale for Evaluation and Treatment Rehabilitation  ONSET DATE: chronic  SUBJECTIVE:                                                                                                                                                                                      SUBJECTIVE STATEMENT: Played pickle ball this weekend- after about 1.5 hr began feeling discomfort in Lt inner thigh- which feels fine today. Also put in a ceiling fan over the weekend and residual pain in Rt lower/thoracic. Also residual along Rt post deltoid. Did the stretches/exercises and it gradually got better through the day.    PERTINENT HISTORY: Rt elbow surgery  PAIN:  Are you having pain? Yes: NPRS scale: 0/10 Pain location: post Rt shoulder Pain description: tight/stuck Aggravating factors:  tennis, pickle ball Relieving factors: DN,  exercises, pillow on anterior shoulder when laying prone   PRECAUTIONS: None  WEIGHT BEARING RESTRICTIONS No  FALLS:  Has patient fallen in last 6 months? No  PLOF: Independent  PATIENT GOALS decrease pain, back to sports/fitness  OBJECTIVE:   POSTURE: Lateral tracking Lt patella in standing Superior Lt sacral quadrant anteriorly rotated on oblique axis  UPPER EXTREMITY ROM:   Gross hypermobility throughout body  EXTREMITY MMT:  MMT (lb) Right eval Left eval Rt/Lt 11/30  Shoulder flexion     Shoulder extension     Shoulder abduction     Shoulder adduction     Shoulder internal rotation  Shoulder external rotation 23 22.3 27.2 / 23.1  Knee extension 57.9 59.2 64.6 / 65.2       (Blank rows = not tested)   JOINT MOBILITY TESTING:  hypermobility  PALPATION:  Spasm in Rt infraspinatus, lats, subscap; VMO, VL   TODAY'S TREATMENT:    Treatment                            01/01/23:  Trigger Point Dry Needling, Manual Therapy Treatment:  Initial or subsequent education regarding Trigger Point Dry Needling: Subsequent Did patient give consent to treatment with Trigger Point Dry Needling: Yes TPDN with skilled palpation and monitoring followed by STM to the following muscles: Rt L1-3 parapsinals; Rt infraspinatus  Seated figure 4 with trunk rotation Door pull off rhomboid stretch Traction hang Trx stretch for hamstrings Roller to lumbar spine   Treatment                            2/12:  Trigger Point Dry Needling, Manual Therapy Treatment:  Initial or subsequent education regarding Trigger Point Dry Needling: Subsequent Did patient give consent to treatment with Trigger Point Dry Needling: Yes TPDN with skilled palpation and monitoring followed by STM to the following muscles: Rt thoracic paraspinals over lower ribs  Chid pose Down dog Long sitting roll up and back with arms overhead- with anchored purple handle band from weight room Tennis serve & ecc  return anchored by band- Rt foot at [employer]   Treatment                            2/5:  Theract: pickleball/tennis motions in gym C-sit with alt UE flexion, horiz add across opp UE- cues for trunk rotation, alt UE horiz abd PRI- all 4 belly lift walk     PATIENT EDUCATION: Education details: Anatomy of condition, POC, HEP, exercise form/rationale Person educated: Patient Education method: Explanation and Verbal cues Education comprehension: verbalized understanding and needs further education   HOME EXERCISE PROGRAM: Previous HEP- [id number] PRI: Rt Sidelying clams- feet pressed into wall for LE 90/90, ball bw ankles and added adduction of Rt knee PRI: long seated supported press down with Right iliacus and psoas PRI all 4 belly lift walk Resting stance with left foot slightly back  ASSESSMENT:  CLINICAL IMPRESSION:  Significant difficulty finding stretch through the lumbar spine due to hypermobility of shoulders and significant tightness in lower extremities.  Able to resolve with dry needling and rolling.  Shoulder pain is similar to prior but is not shifting into the lower extremity at this point.  Encouraged him to go try tennis service about 5 and a time with stretches and use of abdominal for power prior to playing in a game.  I do think that the sensations he was feeling today is due to soreness from not having played pickle ball in a while and will need to continue to challenge endurance.  OBJECTIVE IMPAIRMENTS decreased activity tolerance, decreased strength, increased muscle spasms, impaired UE functional use, improper body mechanics, postural dysfunction, and pain.   ACTIVITY LIMITATIONS carrying, lifting, standing, squatting, sleeping, stairs, and locomotion level  PARTICIPATION LIMITATIONS: driving, community activity, occupation, and yard work  PERSONAL FACTORS Time since onset of injury/illness/exacerbation and 1 comorbidity: general hypermobility  are also  affecting patient's functional outcome.     GOALS: Goals reviewed with patient? Yes  SHORT TERM GOALS: Target date: 09/16/22  Will return to daily compliance with HEP Baseline: asked that he focus on 3 PRI exercises Goal status:met   LONG TERM GOALS: Target date: POC date  Able to sleep without limitation by thigh pain Baseline: sleeping is improving, will be able to reassess when I start playing again Goal status: ongoing 1/29   2.  Able to return to pickle ball Baseline: unable due to pain at eval Goal status: INITIAL  3.  GHJ ER strength to improve by 15% bilaterally  Baseline: see chart Goal status: INITIAL    PLAN: PT FREQUENCY: 1-2x/week  PT DURATION: 6 weeks  PLANNED INTERVENTIONS: Therapeutic exercises, Therapeutic activity, Neuromuscular re-education, Balance training, Patient/Family education, Self Care, Joint mobilization, Aquatic Therapy, Dry Needling, Electrical stimulation, Spinal mobilization, Cryotherapy, Moist heat, Taping, Traction, Ionotophoresis 40m/ml Dexamethasone, Manual therapy, and Re-evaluation  PLAN FOR NEXT SESSION: DN PRN. Upright balance with rotation, core.    Gaytha Raybourn C. Nansi Birmingham PT, DPT 01/01/23 4:47 PM

## 2023-01-08 ENCOUNTER — Ambulatory Visit (HOSPITAL_BASED_OUTPATIENT_CLINIC_OR_DEPARTMENT_OTHER): Payer: BC Managed Care – PPO | Admitting: Physical Therapy

## 2023-01-08 ENCOUNTER — Encounter (HOSPITAL_BASED_OUTPATIENT_CLINIC_OR_DEPARTMENT_OTHER): Payer: Self-pay | Admitting: Physical Therapy

## 2023-01-08 DIAGNOSIS — G8929 Other chronic pain: Secondary | ICD-10-CM

## 2023-01-08 DIAGNOSIS — M25511 Pain in right shoulder: Secondary | ICD-10-CM | POA: Diagnosis not present

## 2023-01-08 DIAGNOSIS — M79605 Pain in left leg: Secondary | ICD-10-CM

## 2023-01-08 NOTE — Therapy (Signed)
OUTPATIENT PHYSICAL THERAPY SHOULDER TREATMENT   Patient Name: Keith Montoya MRN: [id number] DOB:1976-09-11, 47 y.o., male Today's Date: 01/08/2023   PT End of Session - 01/08/23 1602     Visit Number 22    Date for PT Re-Evaluation 02/02/23    Authorization Type BCBS 30 VL    PT Start Time 1600    PT Stop Time 1646    PT Time Calculation (min) 46 min    Activity Tolerance Patient tolerated treatment well    Behavior During Therapy [facility] for tasks assessed/performed                      History reviewed. No pertinent past medical history. History reviewed. No pertinent surgical history. There are no problems to display for this patient.   REFERRING PROVIDER: Laroy Apple, MD  REFERRING DIAG:  M25.511 (ICD-10-CM) - Right shoulder pain  M67.813 (ICD-10-CM) - Other specified disorders of tendon, right shoulder  M54.2 (ICD-10-CM) - Cervicalgia  M47.896 (ICD-10-CM) - Other spondylosis, lumbar region    THERAPY DIAG:  Chronic right shoulder pain  Pain in left leg  Rationale for Evaluation and Treatment Rehabilitation  ONSET DATE: chronic  SUBJECTIVE:                                                                                                                                                                                      SUBJECTIVE STATEMENT: Played singles tennis. About 55-60 min started feeling some discomfort into Lt ant thigh. Hips were sore and tight spot in right back is there. Right shoulder feels like it is hanging off- very unstable today.     PERTINENT HISTORY: Rt elbow surgery  PAIN:  Are you having pain? Yes: NPRS scale: 0/10 Pain location: post Rt shoulder Pain description: tight/stuck Aggravating factors:  tennis, pickle ball Relieving factors: DN, exercises, pillow on anterior shoulder when laying prone   PRECAUTIONS: None  WEIGHT BEARING RESTRICTIONS No  FALLS:  Has patient fallen in last 6 months? No  PLOF:  Independent  PATIENT GOALS decrease pain, back to sports/fitness  OBJECTIVE:   POSTURE: Lateral tracking Lt patella in standing Superior Lt sacral quadrant anteriorly rotated on oblique axis  UPPER EXTREMITY ROM:   Gross hypermobility throughout body  EXTREMITY MMT:  MMT (lb) Right eval Left eval Rt/Lt 11/30  Shoulder flexion     Shoulder extension     Shoulder abduction     Shoulder adduction     Shoulder internal rotation     Shoulder external rotation 23 22.3 27.2 / 23.1  Knee extension 57.9 59.2 64.6 / 65.2       (Blank rows =  not tested)   JOINT MOBILITY TESTING:  hypermobility  PALPATION:  Spasm in Rt infraspinatus, lats, subscap; VMO, VL   TODAY'S TREATMENT:   Treatment                            2/26:  Prone manual mobilization to Rt ribs and STM to periscapular musculature. OH arnold press and hold 5lb IASTM anterior deltoid Repeat arnold presses  OH farmer carries 5lb- cues for core and scap retraction/depression PRI- all 4 belly lift walk    Treatment                            01/01/23:  Trigger Point Dry Needling, Manual Therapy Treatment:  Initial or subsequent education regarding Trigger Point Dry Needling: Subsequent Did patient give consent to treatment with Trigger Point Dry Needling: Yes TPDN with skilled palpation and monitoring followed by STM to the following muscles: Rt L1-3 parapsinals; Rt infraspinatus  Seated figure 4 with trunk rotation Door pull off rhomboid stretch Traction hang Trx stretch for hamstrings Roller to lumbar spine   Treatment                            2/12:  Trigger Point Dry Needling, Manual Therapy Treatment:  Initial or subsequent education regarding Trigger Point Dry Needling: Subsequent Did patient give consent to treatment with Trigger Point Dry Needling: Yes TPDN with skilled palpation and monitoring followed by STM to the following muscles: Rt thoracic paraspinals over lower ribs  Chid pose Down  dog Long sitting roll up and back with arms overhead- with anchored purple handle band from weight room Tennis serve & ecc return anchored by band- Rt foot at [employer]   Treatment                            2/5:  Theract: pickleball/tennis motions in gym C-sit with alt UE flexion, horiz add across opp UE- cues for trunk rotation, alt UE horiz abd PRI- all 4 belly lift walk     PATIENT EDUCATION: Education details: Anatomy of condition, POC, HEP, exercise form/rationale Person educated: Patient Education method: Explanation and Verbal cues Education comprehension: verbalized understanding and needs further education   HOME EXERCISE PROGRAM: Previous HEP- [id number] PRI: Rt Sidelying clams- feet pressed into wall for LE 90/90, ball bw ankles and added adduction of Rt knee PRI: long seated supported press down with Right iliacus and psoas PRI all 4 belly lift walk Resting stance with left foot slightly back  ASSESSMENT:  CLINICAL IMPRESSION:  Resolved pain in abd and ER following IASTM.  Signs and symptoms consistent with soreness following increase in activity demands on shoulder.  Right mid thoracic rib internally rotated and addressed with manual therapy reduced sensation of laxity through shoulder.  Able to tolerate larger range stability holds and able to verbalize sensation of avoiding shoulder hike.  Will continue to benefit from skilled PT to address glenohumeral joint instability and biomechanical chain inputs and reactions to return to prior level of function without risk of further injury.  OBJECTIVE IMPAIRMENTS decreased activity tolerance, decreased strength, increased muscle spasms, impaired UE functional use, improper body mechanics, postural dysfunction, and pain.   ACTIVITY LIMITATIONS carrying, lifting, standing, squatting, sleeping, stairs, and locomotion level  PARTICIPATION LIMITATIONS: driving, community activity, occupation, and yard work  PERSONAL FACTORS  Time since onset of injury/illness/exacerbation and 1 comorbidity: general hypermobility  are also affecting patient's functional outcome.     GOALS: Goals reviewed with patient? Yes  SHORT TERM GOALS: Target date: 09/16/22  Will return to daily compliance with HEP Baseline: asked that he focus on 3 PRI exercises Goal status:met   LONG TERM GOALS: Target date: POC date  Able to sleep without limitation by thigh pain Baseline: sleeping is improving, will be able to reassess when I start playing again Goal status: ongoing 1/29   2.  Able to return to pickle ball Baseline: unable due to pain at eval Goal status: INITIAL  3.  GHJ ER strength to improve by 15% bilaterally  Baseline: see chart Goal status: INITIAL    PLAN: PT FREQUENCY: 1-2x/week  PT DURATION: 6 weeks  PLANNED INTERVENTIONS: Therapeutic exercises, Therapeutic activity, Neuromuscular re-education, Balance training, Patient/Family education, Self Care, Joint mobilization, Aquatic Therapy, Dry Needling, Electrical stimulation, Spinal mobilization, Cryotherapy, Moist heat, Taping, Traction, Ionotophoresis '4mg'$ /ml Dexamethasone, Manual therapy, and Re-evaluation  PLAN FOR NEXT SESSION: DN PRN. Upright balance with rotation, core.    Micaiah Remillard C. Angelin Cutrone PT, DPT 01/08/23 4:55 PM

## 2023-01-18 ENCOUNTER — Ambulatory Visit (HOSPITAL_BASED_OUTPATIENT_CLINIC_OR_DEPARTMENT_OTHER): Payer: BC Managed Care – PPO | Attending: Physical Medicine and Rehabilitation | Admitting: Physical Therapy

## 2023-01-18 ENCOUNTER — Encounter (HOSPITAL_BASED_OUTPATIENT_CLINIC_OR_DEPARTMENT_OTHER): Payer: Self-pay | Admitting: Physical Therapy

## 2023-01-18 DIAGNOSIS — M545 Low back pain, unspecified: Secondary | ICD-10-CM | POA: Diagnosis present

## 2023-01-18 DIAGNOSIS — M5459 Other low back pain: Secondary | ICD-10-CM | POA: Diagnosis present

## 2023-01-18 DIAGNOSIS — G8929 Other chronic pain: Secondary | ICD-10-CM | POA: Diagnosis present

## 2023-01-18 DIAGNOSIS — M79605 Pain in left leg: Secondary | ICD-10-CM | POA: Insufficient documentation

## 2023-01-18 DIAGNOSIS — M25511 Pain in right shoulder: Secondary | ICD-10-CM | POA: Diagnosis not present

## 2023-01-18 DIAGNOSIS — M542 Cervicalgia: Secondary | ICD-10-CM | POA: Diagnosis present

## 2023-01-18 NOTE — Therapy (Signed)
OUTPATIENT PHYSICAL THERAPY SHOULDER TREATMENT   Patient Name: Keith Montoya MRN: [id number] DOB:14-May-1976, 47 y.o., male Today's Date: 01/18/2023   PT End of Session - 01/18/23 1220     Visit Number 23    Date for PT Re-Evaluation 02/02/23    Authorization Type [employer]BS 30 VL    PT Start Time 1218    PT Stop Time 1256    PT Time Calculation (min) 38 min    Activity Tolerance Patient tolerated treatment well    Behavior During Therapy [facility] for tasks assessed/performed                      History reviewed. No pertinent past medical history. History reviewed. No pertinent surgical history. There are no problems to display for this patient.   REFERRING PROVIDER: Laroy Apple, MD  REFERRING DIAG:  M25.511 (ICD-10-CM) - Right shoulder pain  M67.813 (ICD-10-CM) - Other specified disorders of tendon, right shoulder  M54.2 (ICD-10-CM) - Cervicalgia  M47.896 (ICD-10-CM) - Other spondylosis, lumbar region    THERAPY DIAG:  Chronic right shoulder pain  Pain in left leg  Chronic left-sided low back pain, unspecified whether sciatica present  Other low back pain  Cervicalgia  Rationale for Evaluation and Treatment Rehabilitation  ONSET DATE: chronic  SUBJECTIVE:                                                                                                                                                                                      SUBJECTIVE STATEMENT: Tues/Wed/Thur after last appt felt not good shoulder wise. Like the arm was not connected and had a dull ache. Nothing in the thigh. Shoulder is back to normal today. Have not done much physically for the week but plan to do so today and this weekend.     PERTINENT HISTORY: Rt elbow surgery  PAIN:  Are you having pain? Yes: NPRS scale: 0/10 Pain location: post Rt shoulder Pain description: tight/stuck Aggravating factors:  tennis, pickle ball Relieving factors: DN, exercises, pillow on anterior  shoulder when laying prone   PRECAUTIONS: None  WEIGHT BEARING RESTRICTIONS No  FALLS:  Has patient fallen in last 6 months? No  PLOF: Independent  PATIENT GOALS decrease pain, back to sports/fitness  OBJECTIVE:   POSTURE: Lateral tracking Lt patella in standing Superior Lt sacral quadrant anteriorly rotated on oblique axis  UPPER EXTREMITY ROM:   Gross hypermobility throughout body  EXTREMITY MMT:  MMT (lb) Right eval Left eval Rt/Lt 11/30  Shoulder flexion     Shoulder extension     Shoulder abduction     Shoulder adduction     Shoulder internal rotation  Shoulder external rotation 23 22.3 27.2 / 23.1  Knee extension 57.9 59.2 64.6 / 65.2       (Blank rows = not tested)   JOINT MOBILITY TESTING:  hypermobility  PALPATION:  Spasm in Rt infraspinatus, lats, subscap; VMO, VL   TODAY'S TREATMENT:   Treatment                            01/18/23:  MANUAL: prone rib mobility, thoracic PA, Rt thoraco lumbar paraspinal STM Prone shoulder flexion thumbs up Prone flexion to goal post palms down Half kneel & yellow tband Ktape- deltoid support Plank with alt shoulder reach   Treatment                            2/26:  Prone manual mobilization to Rt ribs and STM to periscapular musculature. OH arnold press and hold 5lb IASTM anterior deltoid Repeat arnold presses  OH farmer carries 5lb- cues for core and scap retraction/depression PRI- all 4 belly lift walk    Treatment                            01/01/23:  Trigger Point Dry Needling, Manual Therapy Treatment:  Initial or subsequent education regarding Trigger Point Dry Needling: Subsequent Did patient give consent to treatment with Trigger Point Dry Needling: Yes TPDN with skilled palpation and monitoring followed by STM to the following muscles: Rt L1-3 parapsinals; Rt infraspinatus  Seated figure 4 with trunk rotation Door pull off rhomboid stretch Traction hang Trx stretch for hamstrings Roller  to lumbar spine     PATIENT EDUCATION: Education details: Geophysicist/field seismologist of condition, POC, HEP, exercise form/rationale Person educated: Patient Education method: Explanation and Verbal cues Education comprehension: verbalized understanding and needs further education   HOME EXERCISE PROGRAM: Previous HEP- [id number] PRI: Rt Sidelying clams- feet pressed into wall for LE 90/90, ball bw ankles and added adduction of Rt knee PRI: long seated supported press down with Right iliacus and psoas PRI all 4 belly lift walk Resting stance with left foot slightly back  ASSESSMENT:  CLINICAL IMPRESSION:  Held OH weight manipulation exercises today due to pain that resulted last time. Rt mid-thoracic rib limited mobility but resolves quickly with manual therapy at this point. Due to long work days and poor ergonomics in home office, rt lumbar paraspinals were hypertrophied. Altered Y with resistance to being on both knees with slight post lean to improve core engagement and reduce compensatory patterns through hip. Significant difficulty using obliques for midline stability in planks.   OBJECTIVE IMPAIRMENTS decreased activity tolerance, decreased strength, increased muscle spasms, impaired UE functional use, improper body mechanics, postural dysfunction, and pain.   ACTIVITY LIMITATIONS carrying, lifting, standing, squatting, sleeping, stairs, and locomotion level  PARTICIPATION LIMITATIONS: driving, community activity, occupation, and yard work  PERSONAL FACTORS Time since onset of injury/illness/exacerbation and 1 comorbidity: general hypermobility  are also affecting patient's functional outcome.     GOALS: Goals reviewed with patient? Yes  SHORT TERM GOALS: Target date: 09/16/22  Will return to daily compliance with HEP Baseline: asked that he focus on 3 PRI exercises Goal status:met   LONG TERM GOALS: Target date: POC date  Able to sleep without limitation by thigh pain Baseline:  sleeping is improving, will be able to reassess when I start playing again Goal status: ongoing 1/29   2.  Able to return to pickle ball Baseline: unable due to pain at eval Goal status: INITIAL  3.  GHJ ER strength to improve by 15% bilaterally  Baseline: see chart Goal status: INITIAL    PLAN: PT FREQUENCY: 1-2x/week  PT DURATION: 6 weeks  PLANNED INTERVENTIONS: Therapeutic exercises, Therapeutic activity, Neuromuscular re-education, Balance training, Patient/Family education, Self Care, Joint mobilization, Aquatic Therapy, Dry Needling, Electrical stimulation, Spinal mobilization, Cryotherapy, Moist heat, Taping, Traction, Ionotophoresis '4mg'$ /ml Dexamethasone, Manual therapy, and Re-evaluation  PLAN FOR NEXT SESSION: DN PRN. Upright balance with rotation, core.    Moshe Wenger C. Anneke Cundy PT, DPT 01/18/23 1:35 PM

## 2023-01-26 ENCOUNTER — Ambulatory Visit (HOSPITAL_BASED_OUTPATIENT_CLINIC_OR_DEPARTMENT_OTHER): Payer: BC Managed Care – PPO | Admitting: Physical Therapy

## 2023-01-26 ENCOUNTER — Encounter (HOSPITAL_BASED_OUTPATIENT_CLINIC_OR_DEPARTMENT_OTHER): Payer: Self-pay | Admitting: Physical Therapy

## 2023-01-26 DIAGNOSIS — M25511 Pain in right shoulder: Secondary | ICD-10-CM | POA: Diagnosis not present

## 2023-01-26 DIAGNOSIS — G8929 Other chronic pain: Secondary | ICD-10-CM

## 2023-01-26 DIAGNOSIS — M79605 Pain in left leg: Secondary | ICD-10-CM

## 2023-01-26 NOTE — Therapy (Unsigned)
OUTPATIENT PHYSICAL THERAPY SHOULDER TREATMENT   Patient Name: Keith Montoya MRN: [id number] DOB:17-Feb-1976, 47 y.o., male Today's Date: 01/18/2023   PT End of Session - 01/18/23 1220     Visit Number 23    Date for PT Re-Evaluation 02/02/23    Authorization Type BCBS 30 VL    PT Start Time 1218    PT Stop Time 1256    PT Time Calculation (min) 38 min    Activity Tolerance Patient tolerated treatment well    Behavior During Therapy [facility] for tasks assessed/performed                      History reviewed. No pertinent past medical history. History reviewed. No pertinent surgical history. There are no problems to display for this patient.   REFERRING PROVIDER: Laroy Apple, MD  REFERRING DIAG:  M25.511 (ICD-10-CM) - Right shoulder pain  M67.813 (ICD-10-CM) - Other specified disorders of tendon, right shoulder  M54.2 (ICD-10-CM) - Cervicalgia  M47.896 (ICD-10-CM) - Other spondylosis, lumbar region    THERAPY DIAG:  Chronic right shoulder pain  Pain in left leg  Chronic left-sided low back pain, unspecified whether sciatica present  Other low back pain  Cervicalgia  Rationale for Evaluation and Treatment Rehabilitation  ONSET DATE: chronic  SUBJECTIVE:                                                                                                                                                                                      SUBJECTIVE STATEMENT: First time serving in tennis yesterday- not match play, more controlled. Played pickle ball after. The back is tight but not locked. Lower/deep spot of shoulder is there but not terrible. Started to feel fatigued after about 1.5 of pickle ball but pain never went to hips. Planning to play tennis tomorrow. Tape was very helpful.     PERTINENT HISTORY: Rt elbow surgery  PAIN:  Are you having pain? Yes: NPRS scale: 0/10 Pain location: post Rt shoulder Pain description: tight/stuck Aggravating  factors:  tennis, pickle ball Relieving factors: DN, exercises, pillow on anterior shoulder when laying prone   PRECAUTIONS: None  WEIGHT BEARING RESTRICTIONS No  FALLS:  Has patient fallen in last 6 months? No  PLOF: Independent  PATIENT GOALS decrease pain, back to sports/fitness  OBJECTIVE:   POSTURE: Lateral tracking Lt patella in standing Superior Lt sacral quadrant anteriorly rotated on oblique axis  UPPER EXTREMITY ROM:   Gross hypermobility throughout body  EXTREMITY MMT:  MMT (lb) Right eval Left eval Rt/Lt 11/30  Shoulder flexion     Shoulder extension     Shoulder abduction  Shoulder adduction     Shoulder internal rotation     Shoulder external rotation 23 22.3 27.2 / 23.1  Knee extension 57.9 59.2 64.6 / 65.2       (Blank rows = not tested)   JOINT MOBILITY TESTING:  hypermobility  PALPATION:  Spasm in Rt infraspinatus, lats, subscap; VMO, VL   TODAY'S TREATMENT:   Treatment                            3/15:  Ktape- deltoid   Treatment                            01/18/23:  MANUAL: prone rib mobility, thoracic PA, Rt thoraco lumbar paraspinal STM Prone shoulder flexion thumbs up Prone flexion to goal post palms down Half kneel & yellow tband Ktape- deltoid support Plank with alt shoulder reach   Treatment                            2/26:  Prone manual mobilization to Rt ribs and STM to periscapular musculature. OH arnold press and hold 5lb IASTM anterior deltoid Repeat arnold presses  OH farmer carries 5lb- cues for core and scap retraction/depression PRI- all 4 belly lift walk    Treatment                            01/01/23:  Trigger Point Dry Needling, Manual Therapy Treatment:  Initial or subsequent education regarding Trigger Point Dry Needling: Subsequent Did patient give consent to treatment with Trigger Point Dry Needling: Yes TPDN with skilled palpation and monitoring followed by STM to the following muscles: Rt L1-3  parapsinals; Rt infraspinatus  Seated figure 4 with trunk rotation Door pull off rhomboid stretch Traction hang Trx stretch for hamstrings Roller to lumbar spine     PATIENT EDUCATION: Education details: Geophysicist/field seismologist of condition, POC, HEP, exercise form/rationale Person educated: Patient Education method: Explanation and Verbal cues Education comprehension: verbalized understanding and needs further education   HOME EXERCISE PROGRAM: Previous HEP- [id number] PRI: Rt Sidelying clams- feet pressed into wall for LE 90/90, ball bw ankles and added adduction of Rt knee PRI: long seated supported press down with Right iliacus and psoas PRI all 4 belly lift walk Resting stance with left foot slightly back  ASSESSMENT:  CLINICAL IMPRESSION:  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 ***  OBJECTIVE IMPAIRMENTS decreased activity tolerance, decreased strength, increased muscle spasms, impaired UE functional use, improper body mechanics, postural dysfunction, and pain.   ACTIVITY LIMITATIONS carrying, lifting, standing, squatting, sleeping, stairs, and locomotion level  PARTICIPATION LIMITATIONS: driving, community activity, occupation, and yard work  PERSONAL FACTORS Time since onset of injury/illness/exacerbation and 1 comorbidity: general hypermobility  are also affecting patient's functional outcome.     GOALS: Goals reviewed with patient? Yes  SHORT TERM GOALS: Target date: 09/16/22  Will return to daily compliance with HEP Baseline: asked that he focus on 3 PRI exercises Goal status:met   LONG TERM GOALS: Target date: POC date  Able to sleep without limitation by thigh pain Baseline: sleeping is improving, will be able to reassess when I start playing again Goal status: ongoing 1/29   2.  Able to return to pickle ball Baseline: unable due to pain at eval Goal status: INITIAL  3.  GHJ ER strength to improve  by 15% bilaterally  Baseline: see chart Goal status:  INITIAL    PLAN: PT FREQUENCY: 1-2x/week  PT DURATION: 6 weeks  PLANNED INTERVENTIONS: Therapeutic exercises, Therapeutic activity, Neuromuscular re-education, Balance training, Patient/Family education, Self Care, Joint mobilization, Aquatic Therapy, Dry Needling, Electrical stimulation, Spinal mobilization, Cryotherapy, Moist heat, Taping, Traction, Ionotophoresis 4mg /ml Dexamethasone, Manual therapy, and Re-evaluation  PLAN FOR NEXT SESSION: DN PRN. Upright balance with rotation, core.    Britini Garcilazo C. Clara Smolen PT, DPT 01/18/23 1:35 PM

## 2023-01-30 ENCOUNTER — Ambulatory Visit (HOSPITAL_BASED_OUTPATIENT_CLINIC_OR_DEPARTMENT_OTHER): Payer: BC Managed Care – PPO | Admitting: Physical Therapy

## 2023-01-30 ENCOUNTER — Encounter (HOSPITAL_BASED_OUTPATIENT_CLINIC_OR_DEPARTMENT_OTHER): Payer: Self-pay | Admitting: Physical Therapy

## 2023-01-30 DIAGNOSIS — M79605 Pain in left leg: Secondary | ICD-10-CM

## 2023-01-30 DIAGNOSIS — G8929 Other chronic pain: Secondary | ICD-10-CM

## 2023-01-30 DIAGNOSIS — M25511 Pain in right shoulder: Secondary | ICD-10-CM | POA: Diagnosis not present

## 2023-01-30 NOTE — Therapy (Signed)
OUTPATIENT PHYSICAL THERAPY SHOULDER TREATMENT   Patient Name: Melba Visaya MRN: [id number] DOB:05-13-1976, 47 y.o., male Today's Date: 01/30/2023   PT End of Session - 01/30/23 0930     Visit Number 25    Date for PT Re-Evaluation 02/02/23    Authorization Type BCBS 30 VL    PT Start Time 0930    PT Stop Time 1010    PT Time Calculation (min) 40 min    Activity Tolerance Patient tolerated treatment well    Behavior During Therapy [facility] for tasks assessed/performed               History reviewed. No pertinent past medical history. History reviewed. No pertinent surgical history. There are no problems to display for this patient.   REFERRING PROVIDER: Laroy Apple, MD  REFERRING DIAG:  M25.511 (ICD-10-CM) - Right shoulder pain  M67.813 (ICD-10-CM) - Other specified disorders of tendon, right shoulder  M54.2 (ICD-10-CM) - Cervicalgia  M47.896 (ICD-10-CM) - Other spondylosis, lumbar region    THERAPY DIAG:  Chronic right shoulder pain  Pain in left leg  Rationale for Evaluation and Treatment Rehabilitation  ONSET DATE: chronic  SUBJECTIVE:                                                                                                                                                                                      SUBJECTIVE STATEMENT: Played tennis for the first time. Played 2 sets and a tie breaker. Body felt pretty good. By the end I was ready to stop. Almost coming down into triceps more than shoulder is about all of the residual. No back issues. Wore tape and sleeve. Feels that the tape is very helpful.    PERTINENT HISTORY: Rt elbow surgery  PAIN:  Are you having pain? Yes: NPRS scale: 0/10 Pain location: post Rt shoulder Pain description: tight/stuck Aggravating factors:  tennis, pickle ball Relieving factors: DN, exercises, pillow on anterior shoulder when laying prone   PRECAUTIONS: None  WEIGHT BEARING RESTRICTIONS No  FALLS:  Has  patient fallen in last 6 months? No  PLOF: Independent  PATIENT GOALS decrease pain, back to sports/fitness  OBJECTIVE:   POSTURE: Lateral tracking Lt patella in standing Superior Lt sacral quadrant anteriorly rotated on oblique axis  UPPER EXTREMITY ROM:   Gross hypermobility throughout body  EXTREMITY MMT:  MMT (lb) Right eval Left eval Rt/Lt 11/30  Shoulder flexion     Shoulder extension     Shoulder abduction     Shoulder adduction     Shoulder internal rotation     Shoulder external rotation 23 22.3 27.2 / 23.1  Knee extension 57.9 59.2 64.6 /  65.2       (Blank rows = not tested)   JOINT MOBILITY TESTING:  hypermobility  PALPATION:  Spasm in Rt infraspinatus, lats, subscap; VMO, VL   TODAY'S TREATMENT:   Treatment                            01/30/23:  Trigger Point Dry Needling, Manual Therapy Treatment:  Initial or subsequent education regarding Trigger Point Dry Needling: Subsequent Did patient give consent to treatment with Trigger Point Dry Needling: Yes TPDN with skilled palpation and monitoring followed by STM to the following muscles: Rt post deltoid, teres, infraspinatus  Long sitting round back with 2lb held [facility]- added round back with tennis set motion and pull across Hooklying dead bug ext Rt UE/LLE- protract/retract through shoulder Quadruped thoracic rotation- avoiding excessive horiz abd   Treatment                            3/15:  Ktape- deltoid ER/IR at 90 concentric, eccentric Tennis reach/serve concentric/ecc with hand weight Trigger Point Dry Needling, Manual Therapy Treatment:  Initial or subsequent education regarding Trigger Point Dry Needling: Subsequent Did patient give consent to treatment with Trigger Point Dry Needling: Yes TPDN with skilled palpation and monitoring followed by STM to the following muscles: Rt infraspinatus, lats, teres min/maj     Treatment                            01/18/23:  MANUAL: prone rib mobility,  thoracic PA, Rt thoraco lumbar paraspinal STM Prone shoulder flexion thumbs up Prone flexion to goal post palms down Half kneel & yellow tband Ktape- deltoid support Plank with alt shoulder reach    PATIENT EDUCATION: Education details: Geophysicist/field seismologist of condition, POC, HEP, exercise form/rationale Person educated: Patient Education method: Explanation and Verbal cues Education comprehension: verbalized understanding and needs further education   HOME EXERCISE PROGRAM: Previous HEP- [id number] PRI: Rt Sidelying clams- feet pressed into wall for LE 90/90, ball bw ankles and added adduction of Rt knee PRI: long seated supported press down with Right iliacus and psoas PRI all 4 belly lift walk Resting stance with left foot slightly back  ASSESSMENT:  CLINICAL IMPRESSION:  Continued to progress stabilization challenges in large ranges of motion. Qped rotation for horiz abd against gravity with tactile cues required for trunk rotation. Traveling this week, he is out of town next week and I am out of town the following week. Will call if he needs DN for spasm in the mean time.   OBJECTIVE IMPAIRMENTS decreased activity tolerance, decreased strength, increased muscle spasms, impaired UE functional use, improper body mechanics, postural dysfunction, and pain.   ACTIVITY LIMITATIONS carrying, lifting, standing, squatting, sleeping, stairs, and locomotion level  PARTICIPATION LIMITATIONS: driving, community activity, occupation, and yard work  PERSONAL FACTORS Time since onset of injury/illness/exacerbation and 1 comorbidity: general hypermobility  are also affecting patient's functional outcome.     GOALS: Goals reviewed with patient? Yes  SHORT TERM GOALS: Target date: 09/16/22  Will return to daily compliance with HEP Baseline: asked that he focus on 3 PRI exercises Goal status:met   LONG TERM GOALS: Target date: POC date  Able to sleep without limitation by thigh pain Baseline:  sleeping is improving, will be able to reassess when I start playing again Goal status: ongoing 1/29   2.  Able to return to pickle ball Baseline: unable due to pain at eval Goal status: INITIAL  3.  GHJ ER strength to improve by 15% bilaterally  Baseline: see chart Goal status: INITIAL    PLAN: PT FREQUENCY: 1-2x/week  PT DURATION: 6 weeks  PLANNED INTERVENTIONS: Therapeutic exercises, Therapeutic activity, Neuromuscular re-education, Balance training, Patient/Family education, Self Care, Joint mobilization, Aquatic Therapy, Dry Needling, Electrical stimulation, Spinal mobilization, Cryotherapy, Moist heat, Taping, Traction, Ionotophoresis 4mg /ml Dexamethasone, Manual therapy, and Re-evaluation  PLAN FOR NEXT SESSION: DN PRN. Upright balance with rotation, core.    Nikko Quast C. Zniyah Midkiff PT, DPT 01/30/23 10:12 AM

## 2023-02-19 ENCOUNTER — Encounter (HOSPITAL_BASED_OUTPATIENT_CLINIC_OR_DEPARTMENT_OTHER): Payer: Self-pay | Admitting: Physical Therapy

## 2023-02-19 ENCOUNTER — Ambulatory Visit (HOSPITAL_BASED_OUTPATIENT_CLINIC_OR_DEPARTMENT_OTHER): Payer: BC Managed Care – PPO | Attending: Physical Medicine and Rehabilitation | Admitting: Physical Therapy

## 2023-02-19 DIAGNOSIS — G8929 Other chronic pain: Secondary | ICD-10-CM | POA: Diagnosis present

## 2023-02-19 DIAGNOSIS — M25511 Pain in right shoulder: Secondary | ICD-10-CM | POA: Diagnosis not present

## 2023-02-19 DIAGNOSIS — M545 Low back pain, unspecified: Secondary | ICD-10-CM | POA: Insufficient documentation

## 2023-02-19 DIAGNOSIS — M79605 Pain in left leg: Secondary | ICD-10-CM | POA: Insufficient documentation

## 2023-02-19 NOTE — Therapy (Signed)
OUTPATIENT PHYSICAL THERAPY SHOULDER TREATMENT   Patient Name: Lorenso QuarryMichael Manuelito MRN: [id number] DOB:02-13-1976, 47 y.o., male Today's Date: 02/19/2023   PT End of Session - 02/19/23 1301     Visit Number 26    Date for PT Re-Evaluation 04/13/23    Authorization Type BCBS 30 VL    PT Start Time 1300    PT Stop Time 1340    PT Time Calculation (min) 40 min    Activity Tolerance Patient tolerated treatment well    Behavior [facility] for tasks assessed/performed                History reviewed. No pertinent past medical history. History reviewed. No pertinent surgical history. There are no problems to display for this patient.   REFERRING PROVIDER: Romero BellingIbazebo, [place], MD  REFERRING DIAG:  M25.511 (ICD-10-CM) - Right shoulder pain  M67.813 (ICD-10-CM) - Other specified disorders of tendon, right shoulder  M54.2 (ICD-10-CM) - Cervicalgia  M47.896 (ICD-10-CM) - Other spondylosis, lumbar region    THERAPY DIAG:  Chronic right shoulder pain  Pain in left leg  Chronic left-sided low back pain, unspecified whether sciatica present  Rationale for Evaluation and Treatment Rehabilitation  ONSET DATE: chronic  SUBJECTIVE:                                                                                                                                                                                      SUBJECTIVE STATEMENT: Have been very busy and played tennis, pickle ball, mowed lawn. Post shoulder not as tender, feeling more post deltoid and triceps. Upper trap on right side is pretty tender. The fatigue that I felt was through lower back and hips. I never felt it in the left hip.    PERTINENT HISTORY: Rt elbow surgery  PAIN:  Are you having pain? Yes: NPRS scale: 0/10 Pain location: post Rt shoulder Pain description: tight/stuck Aggravating factors:  tennis, pickle ball Relieving factors: DN, exercises, pillow on anterior shoulder when laying prone    PRECAUTIONS: None  WEIGHT BEARING RESTRICTIONS No  FALLS:  Has patient fallen in last 6 months? No  PLOF: Independent  PATIENT GOALS decrease pain, back to sports/fitness  OBJECTIVE:   POSTURE: Lateral tracking Lt patella in standing Superior Lt sacral quadrant anteriorly rotated on oblique axis  UPPER EXTREMITY ROM:   Gross hypermobility throughout body  EXTREMITY MMT:  MMT (lb) Right eval Left eval Rt/Lt  Rt/Lt 4/8  Shoulder flexion      Shoulder extension      Shoulder abduction      Shoulder adduction      Shoulder internal rotation      Shoulder  external rotation 23 22.3 27.2 / 23.1 27.4/23.9  Knee extension 57.9 59.2 64.6 / 65.2         (Blank rows = not tested)   JOINT MOBILITY TESTING:  hypermobility  PALPATION:  Spasm in Rt infraspinatus, lats, subscap; VMO, VL   TODAY'S TREATMENT:   Treatment                            02/19/23:  Trigger Point Dry Needling, Manual Therapy Treatment:  Initial or subsequent education regarding Trigger Point Dry Needling: Subsequent Did patient give consent to treatment with Trigger Point Dry Needling: Yes TPDN with skilled palpation and monitoring followed by STM to the following muscles: Rt upper trap, infraspinatus, teres, lats, post deltoid  Rt first rib mob, STM Rt lower thoracic/lumbar paraspinals & QL PRI all 4 belly lift walk Long sitting roll back Quadruped thoracic rotation- avoiding excessive horiz abd   Treatment                            01/30/23:  Trigger Point Dry Needling, Manual Therapy Treatment:  Initial or subsequent education regarding Trigger Point Dry Needling: Subsequent Did patient give consent to treatment with Trigger Point Dry Needling: Yes TPDN with skilled palpation and monitoring followed by STM to the following muscles: Rt post deltoid, teres, infraspinatus  Long sitting round back with 2lb held [facility]- added round back with tennis set motion and pull across Hooklying dead bug ext  Rt UE/LLE- protract/retract through shoulder Quadruped thoracic rotation- avoiding excessive horiz abd   Treatment                            3/15:  Ktape- deltoid ER/IR at 90 concentric, eccentric Tennis reach/serve concentric/ecc with hand weight Trigger Point Dry Needling, Manual Therapy Treatment:  Initial or subsequent education regarding Trigger Point Dry Needling: Subsequent Did patient give consent to treatment with Trigger Point Dry Needling: Yes TPDN with skilled palpation and monitoring followed by STM to the following muscles: Rt infraspinatus, lats, teres min/maj     PATIENT EDUCATION: Education details: Anatomy of condition, POC, HEP, exercise form/rationale Person educated: Patient Education method: Explanation and Verbal cues Education comprehension: verbalized understanding and needs further education   HOME EXERCISE PROGRAM: Previous HEP- [id number] PRI: Rt Sidelying clams- feet pressed into wall for LE 90/90, ball bw ankles and added adduction of Rt knee PRI: long seated supported press down with Right iliacus and psoas PRI all 4 belly lift walk Resting stance with left foot slightly back  ASSESSMENT:  CLINICAL IMPRESSION:  Twitch response in proximal musculature reduced tightness through Rt triceps- no DN to triceps and no tightness/twitch noted with DN to post deltoid. Improved awareness of posture but does benefit from tactile cues in motions allowing freedom of horiz abd motion.   OBJECTIVE IMPAIRMENTS decreased activity tolerance, decreased strength, increased muscle spasms, impaired UE functional use, improper body mechanics, postural dysfunction, and pain.   ACTIVITY LIMITATIONS carrying, lifting, standing, squatting, sleeping, stairs, and locomotion level  PARTICIPATION LIMITATIONS: driving, community activity, occupation, and yard work  PERSONAL FACTORS Time since onset of injury/illness/exacerbation and 1 comorbidity: general hypermobility  are  also affecting patient's functional outcome.     GOALS: Goals reviewed with patient? Yes  SHORT TERM GOALS: Target date: 09/16/22  Will return to daily compliance with HEP Baseline: asked that he focus  on 3 PRI exercises Goal status:met   LONG TERM GOALS: Target date: POC date  Able to sleep without limitation by thigh pain Baseline: sleeping is improving, will be able to reassess when I start playing again Goal status: ongoing 1/29   2.  Able to return to pickle ball Baseline: was able to go back to playing since last visit but limited tolerance Goal status: ongoing  3.  GHJ ER strength to improve by 15% bilaterally  Baseline: see chart Goal status: ongoing    PLAN: PT FREQUENCY: 1-2x/week  PT DURATION: through May  PLANNED INTERVENTIONS: Therapeutic exercises, Therapeutic activity, Neuromuscular re-education, Balance training, Patient/Family education, Self Care, Joint mobilization, Aquatic Therapy, Dry Needling, Electrical stimulation, Spinal mobilization, Cryotherapy, Moist heat, Taping, Traction, Ionotophoresis 4mg /ml Dexamethasone, Manual therapy, and Re-evaluation  PLAN FOR NEXT SESSION: DN PRN. Upright balance with rotation, core.   Thaddus Mcdowell C. Maezie Justin PT, DPT 02/19/23 1:44 PM

## 2023-02-22 ENCOUNTER — Encounter: Payer: Self-pay | Admitting: Sports Medicine

## 2023-02-22 ENCOUNTER — Ambulatory Visit: Payer: BC Managed Care – PPO | Admitting: Sports Medicine

## 2023-02-22 ENCOUNTER — Other Ambulatory Visit: Payer: Self-pay

## 2023-02-22 DIAGNOSIS — G8929 Other chronic pain: Secondary | ICD-10-CM | POA: Diagnosis not present

## 2023-02-22 DIAGNOSIS — M79601 Pain in right arm: Secondary | ICD-10-CM | POA: Diagnosis not present

## 2023-02-22 DIAGNOSIS — M25511 Pain in right shoulder: Secondary | ICD-10-CM | POA: Diagnosis not present

## 2023-02-22 NOTE — Progress Notes (Signed)
Right shoulder pain for years now. Has done a trial of PT on/off for 2 years. States the pain/motion is better but not 100% Still has weakness in hands/ tingling in the 4th and 5th digit in right hand  Denies any OTC medication regularly

## 2023-02-22 NOTE — Progress Notes (Signed)
Keith Montoya - 47 y.o. male MRN [id number]  Date of birth: October 17, 1976  Office Visit Note: Visit Date: 02/22/2023 PCP: Pcp, No Referred by: Donalee Citrin, MD  Subjective: Chief Complaint  Patient presents with   Right Shoulder - Pain   HPI: Keith Montoya is a pleasant 47 y.o. male who presents today for chronic right shoulder pain.   He has had years of posterior and radiating shoulder pain on the right. He is RHD. Long time baseball pitcher. Now plays tennis and pickleball. Pain fine at rest, but worse with serving and overhead activity.   Surgical Hx Hx of bone spur removed in RUE (AC/shoulder?) about 20 years ago. Hx of cubital tunnel syndrome in college (surgery with nerve transposition which relieved symptoms). Still occassionally has some weakness and N/T in 4th/5th digit).    Prior Work-up:  - MRI shoulder 12/2016: Normal RTC, s/p AC debridement - NCS/EMG 2020: ? Reportedly normal - MRA 10/2020: Mild supraspinatus tendinosis w/articular surface fraying; no labral tear, mild AC OA  Has had 26 visits to physical therapy at [facility]). Has made progress with this as well as dry needling and has learned how to somewhat control the pain and get through activity, but still remains. He has not had any recent injections into the shoulder region. Does not take any medication for the pain.    Pertinent ROS were reviewed with the patient and found to be negative unless otherwise specified above in HPI.   Assessment & Plan: Visit Diagnoses:  1. Chronic right shoulder pain   2. Right arm pain    Plan: Discussed with Keith Montoya the possible etiology of his chronic posterior and radiating shoulder pain.  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  He has had 2 MRIs EMG/nerve conduction study which were reportedly normal.  His posterior shoulder pain is vague, but he has made good strides with physical therapy, but hoping to  get over the hump.  We discussed all treatment options, proceeded with hopefully a diagnostic and therapeutic shoulder injection into the quadrilateral space.  If this is truly this diagnosis, would expect to have at least 50% improvement over the coming 1-2 weeks. He may ice as well as take over-the-counter anti-inflammatories simply for postinjection pain. In terms of PT, I would like him to continue focusing on rotator cuff and scapular therapy. He will rest with activity modification x 48 hrs, but then following may return to activity and see what type of pain improvement he has. I would like to see how he does over the next 3-4 weeks and present for re-evaluation.  Additional treatment considerations: Repeating QS-injection, trial of GHJ injection (1-time), repeating cervical MRI although very low suspicion giving benign one in 2017 and reportedly normal EMG/NCS.  Follow-up: Return for F/u in about 3-4 weeks (can notify me via MyChart as well).   Meds & Orders: No orders of the defined types were placed in this encounter.   Orders Placed This Encounter  Procedures   Large Joint Inj   [place] Extrem Up Right Ltd     Procedures: Large Joint Inj: R glenohumeral on 02/22/2023 11:53 PM Indications: pain and diagnostic evaluation Details: 22 G 3.5 in needle, ultrasound-guided posterior approach Medications: 2 mL lidocaine 1 %; 2 mL bupivacaine 0.25 %; 40 mg methylPREDNISolone acetate 40 MG/ML Outcome: tolerated well, no immediate complications  US-guided posterior shoulder joint injection, quadrilateral space of right shoulder After discussion on risks/benefits/indications,  informed verbal consent was obtained. A timeout was then performed. The patient was positioned seated on examination table. The patient's shoulder was prepped with betadine and multiple alcohol swabs and utilizing ultrasound guidance, the patient's posterior shoulder joint and quadrilateral space was identified on ultrasound,  careful note of the axillary nerve medially and PCHA laterally was identified. Using ultrasound guidance a 22-gauge, 3.5 inch needle with a mixture of 2:2:1 cc's lidocaine:bupivicaine:depomedrol was directed from a superior to inferior direction via in-plane technique into the shoulder with visualization of appropriate spread of injectate just medial to the axillary nerve. Patient tolerated the procedure well without immediate complications.      Procedure, treatment alternatives, risks and benefits explained, specific risks discussed. Consent was given by the patient. Immediately prior to procedure a time out was called to verify the correct patient, procedure, equipment, support staff and site/side marked as required. Patient was prepped and draped in the usual sterile fashion.          Clinical History: No specialty comments available.  He reports that he has never smoked. He has never used smokeless tobacco. No results for input(s): "HGBA1C", "LABURIC" in the last 8760 hours.  Objective:   Vital Signs: There were no vitals taken for this visit.  Physical Exam  Gen: Well-appearing, in no acute distress; non-toxic CV: Regular Rate. Well-perfused. Warm.  Resp: Breathing unlabored on room air; no wheezing. Psych: Fluid speech in conversation; appropriate affect; normal thought process Neuro: Sensation intact throughout. No gross coordination deficits.   Ortho Exam - Right shoulder: There is a mild TTP with deep palpation within the posterior axillary fold.  No AC joint TTP, negative Tinel's at the suprascapular notch, scalenes and coracoid process.  Range of motion is well-preserved with full range active and passive range of motion in all directions.  5/5 strength testing of the rotator cuff.  Negative provocative rotator cuff testing.  There is some reproduction of pain with Hornblower's and resisted external rotation at 90 degrees of flexion.  Imaging:  US Extrem Up Right  Ltd Limited musculoskeletal ultrasound of the right upper extremity, right  shoulder was performed today.  Examination of the glenohumeral joint shows  no cortical irregularity of the humeral head, intact joint space without  effusion.  There is a small hyperechoic calcification near the posterior  inferior labrum just off the acetabulum.  Evaluation of the spinoglenoid  notch demonstrates no cyst or space-occupying lesion.  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   Axillary nerve identified as well as the posterior circumflex humeral  artery, noted with Doppler use.  There is no paralabral cyst or  space-occupying lesion here.   Narrative & Impression  CLINICAL DATA:  Right shoulder and neck pain for 10 months. No known injury. History of right shoulder surgery 8 years ago.   EXAM: MRI OF THE RIGHT SHOULDER WITHOUT CONTRAST   TECHNIQUE: Multiplanar, multisequence MR imaging of the shoulder was performed. No intravenous contrast was administered.   COMPARISON:  None.   FINDINGS: Rotator cuff:  Intact and normal in appearance.   Muscles:  Normal in appearance without atrophy or focal lesion.   Biceps long head:  Intact and normal in appearance.   Acromioclavicular Joint: Mild degenerative change. The undersurface of the joint has been debrided. Type III acromion. No subacromial/subdeltoid bursal fluid.   Glenohumeral Joint: Negative.   Labrum:  Intact.  Bones:  No fracture or worrisome lesion.   Other: None.   IMPRESSION: Intact and normal appearing rotator cuff.   Status post debridement of the acromioclavicular joint without complicating feature.     Electronically Signed   By: Drusilla Kanner M.D.   On: 12/18/2016 11:35    Past Medical/Family/Surgical/Social History: Medications & Allergies reviewed per EMR, new medications  updated. There are no problems to display for this patient.  History reviewed. No pertinent past medical history. History reviewed. No pertinent family history. History reviewed. No pertinent surgical history. Social History   Occupational History   Not on file  Tobacco Use   Smoking status: Never   Smokeless tobacco: Never  Substance and Sexual Activity   Alcohol use: Not on file   Drug use: Not on file   Sexual activity: Not on file

## 2023-02-23 MED ORDER — BUPIVACAINE HCL 0.25 % IJ SOLN
2.0000 mL | INTRAMUSCULAR | Status: AC | PRN
  Administered 2023-02-22: 2 mL via INTRA_ARTICULAR

## 2023-02-23 MED ORDER — LIDOCAINE HCL 1 % IJ SOLN
2.0000 mL | INTRAMUSCULAR | Status: AC | PRN
  Administered 2023-02-22: 2 mL

## 2023-02-23 MED ORDER — METHYLPREDNISOLONE ACETATE 40 MG/ML IJ SUSP
40.0000 mg | INTRAMUSCULAR | Status: AC | PRN
  Administered 2023-02-22: 40 mg via INTRA_ARTICULAR

## 2023-02-28 ENCOUNTER — Ambulatory Visit (HOSPITAL_BASED_OUTPATIENT_CLINIC_OR_DEPARTMENT_OTHER): Payer: BC Managed Care – PPO | Admitting: Physical Therapy

## 2023-02-28 ENCOUNTER — Encounter (HOSPITAL_BASED_OUTPATIENT_CLINIC_OR_DEPARTMENT_OTHER): Payer: Self-pay | Admitting: Physical Therapy

## 2023-02-28 DIAGNOSIS — G8929 Other chronic pain: Secondary | ICD-10-CM

## 2023-02-28 DIAGNOSIS — M79605 Pain in left leg: Secondary | ICD-10-CM

## 2023-02-28 DIAGNOSIS — M25511 Pain in right shoulder: Secondary | ICD-10-CM | POA: Diagnosis not present

## 2023-02-28 DIAGNOSIS — M545 Low back pain, unspecified: Secondary | ICD-10-CM

## 2023-02-28 NOTE — Therapy (Signed)
OUTPATIENT PHYSICAL THERAPY SHOULDER TREATMENT   Patient Name: Keith Montoya MRN: [id number] DOB:August 16, 1976, 47 y.o., male Today's Date: 02/28/2023   PT End of Session - 02/28/23 1151     Visit Number 27    Date for PT Re-Evaluation 04/13/23    Authorization Type BCBS 30 VL    PT Start Time 1150    PT Stop Time 1229    PT Time Calculation (min) 39 min    Activity Tolerance Patient tolerated treatment well    Behavior During Therapy [facility] for tasks assessed/performed                History reviewed. No pertinent past medical history. History reviewed. No pertinent surgical history. There are no problems to display for this patient.   REFERRING PROVIDER: Romero Belling, MD  REFERRING DIAG:  M25.511 (ICD-10-CM) - Right shoulder pain  M67.813 (ICD-10-CM) - Other specified disorders of tendon, right shoulder  M54.2 (ICD-10-CM) - Cervicalgia  M47.896 (ICD-10-CM) - Other spondylosis, lumbar region    THERAPY DIAG:  Chronic right shoulder pain  Pain in left leg  Chronic left-sided low back pain, unspecified whether sciatica present  Rationale for Evaluation and Treatment Rehabilitation  ONSET DATE: chronic  SUBJECTIVE:                                                                                                                                                                                      SUBJECTIVE STATEMENT: No changes the first couple of days but then I went out and hit and it went well. By Monday my arm felt really loose and like my finger tips were touching the ground. I noticed that I can make a tighter fist. Played tennis last night, by the end of the match had a harder time with grip and dexterity. Right now there is a type of sensation on the dorsal aspect of the hand but I feel strong. Low back and hips area is very sore and tight.    PERTINENT HISTORY: Rt elbow surgery  PAIN:  Are you having pain? Yes: NPRS scale: 0/10 Pain location: post Rt  shoulder Pain description: tight/stuck Aggravating factors:  tennis, pickle ball Relieving factors: DN, exercises, pillow on anterior shoulder when laying prone   PRECAUTIONS: None  WEIGHT BEARING RESTRICTIONS No  FALLS:  Has patient fallen in last 6 months? No  PLOF: Independent  PATIENT GOALS decrease pain, back to sports/fitness  OBJECTIVE:   POSTURE: Lateral tracking Lt patella in standing Superior Lt sacral quadrant anteriorly rotated on oblique axis  UPPER EXTREMITY ROM:   Gross hypermobility throughout body  EXTREMITY MMT:  MMT (lb) Right eval Left eval Rt/Lt  Rt/Lt 4/8 Rt/Lt 4/17  Shoulder flexion       Shoulder extension       Shoulder abduction       Shoulder adduction       Shoulder internal rotation       Shoulder external rotation 23 22.3 27.2 / 23.1 27.4/23.9 27.2/27.9  Knee extension 57.9 59.2 64.6 / 65.2    Grip strength     85/80  (Blank rows = not tested)   JOINT MOBILITY TESTING:  hypermobility  PALPATION:  Spasm in Rt infraspinatus, lats, subscap; VMO, VL   TODAY'S TREATMENT:   Treatment                            02/28/23:  Trigger Point Dry Needling, Manual Therapy Treatment:  Initial or subsequent education regarding Trigger Point Dry Needling: Subsequent Did patient give consent to treatment with Trigger Point Dry Needling: Yes TPDN with skilled palpation and monitoring followed by STM to the following muscles: Rt lumbar paraspinals, Rt infraspinatus, teres, subscap  Ktape for deltoid support- education for wife to apply at home 90/90 ER/IR required PT support Discussion regarding injection outcome and resulting effects   Treatment                            02/19/23:  Trigger Point Dry Needling, Manual Therapy Treatment:  Initial or subsequent education regarding Trigger Point Dry Needling: Subsequent Did patient give consent to treatment with Trigger Point Dry Needling: Yes TPDN with skilled palpation and monitoring followed  by STM to the following muscles: Rt upper trap, infraspinatus, teres, lats, post deltoid  Rt first rib mob, STM Rt lower thoracic/lumbar paraspinals & QL PRI all 4 belly lift walk Long sitting roll back Quadruped thoracic rotation- avoiding excessive horiz abd   Treatment                            01/30/23:  Trigger Point Dry Needling, Manual Therapy Treatment:  Initial or subsequent education regarding Trigger Point Dry Needling: Subsequent Did patient give consent to treatment with Trigger Point Dry Needling: Yes TPDN with skilled palpation and monitoring followed by STM to the following muscles: Rt post deltoid, teres, infraspinatus  Long sitting round back with 2lb held [facility]- added round back with tennis set motion and pull across Hooklying dead bug ext Rt UE/LLE- protract/retract through shoulder Quadruped thoracic rotation- avoiding excessive horiz abd   Treatment                            3/15:  Ktape- deltoid ER/IR at 90 concentric, eccentric Tennis reach/serve concentric/ecc with hand weight Trigger Point Dry Needling, Manual Therapy Treatment:  Initial or subsequent education regarding Trigger Point Dry Needling: Subsequent Did patient give consent to treatment with Trigger Point Dry Needling: Yes TPDN with skilled palpation and monitoring followed by STM to the following muscles: Rt infraspinatus, lats, teres min/maj     PATIENT EDUCATION: Education details: Anatomy of condition, POC, HEP, exercise form/rationale Person educated: Patient Education method: Explanation and Verbal cues Education comprehension: verbalized understanding and needs further education   HOME EXERCISE PROGRAM: Previous HEP- [id number] PRI: Rt Sidelying clams- feet pressed into wall for LE 90/90, ball bw ankles and added adduction of Rt knee PRI: long seated supported press down with Right iliacus and psoas  PRI all 4 belly lift walk Resting stance with left foot slightly  back  ASSESSMENT:  CLINICAL IMPRESSION:  Notable decrease in symptoms following quadrilateral space injection. 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 Had difficulty controlling scap retraciton and did require tactile and verbal cuing today. Will continue to stabilize RC and periscapular structures to meed long term functional goals.   OBJECTIVE IMPAIRMENTS decreased activity tolerance, decreased strength, increased muscle spasms, impaired UE functional use, improper body mechanics, postural dysfunction, and pain.   ACTIVITY LIMITATIONS carrying, lifting, standing, squatting, sleeping, stairs, and locomotion level  PARTICIPATION LIMITATIONS: driving, community activity, occupation, and yard work  PERSONAL FACTORS Time since onset of injury/illness/exacerbation and 1 comorbidity: general hypermobility  are also affecting patient's functional outcome.     GOALS: Goals reviewed with patient? Yes  SHORT TERM GOALS: Target date: 09/16/22  Will return to daily compliance with HEP Baseline: asked that he focus on 3 PRI exercises Goal status:met   LONG TERM GOALS: Target date: POC date  Able to sleep without limitation by thigh pain Baseline: sleeping is improving, will be able to reassess when I start playing again Goal status: ongoing 1/29   2.  Able to return to pickle ball Baseline: was able to go back to playing since last visit but limited tolerance Goal status: ongoing  3.  GHJ ER strength to improve by 15% bilaterally  Baseline: see chart Goal status: ongoing    PLAN: PT FREQUENCY: 1-2x/week  PT DURATION: through May  PLANNED INTERVENTIONS: Therapeutic exercises, Therapeutic activity, Neuromuscular re-education, Balance training, Patient/Family education, Self Care, Joint mobilization, Aquatic Therapy, Dry  Needling, Electrical stimulation, Spinal mobilization, Cryotherapy, Moist heat, Taping, Traction, Ionotophoresis 4mg /ml Dexamethasone, Manual therapy, and Re-evaluation  PLAN FOR NEXT SESSION: DN PRN. Upright balance with rotation, core.   Cheryln Balcom C. Tiberius Loftus PT, DPT 02/28/23 7:04 PM

## 2023-03-07 ENCOUNTER — Ambulatory Visit (HOSPITAL_BASED_OUTPATIENT_CLINIC_OR_DEPARTMENT_OTHER): Payer: BC Managed Care – PPO | Admitting: Physical Therapy

## 2023-03-07 ENCOUNTER — Encounter (HOSPITAL_BASED_OUTPATIENT_CLINIC_OR_DEPARTMENT_OTHER): Payer: Self-pay | Admitting: Physical Therapy

## 2023-03-07 DIAGNOSIS — M79605 Pain in left leg: Secondary | ICD-10-CM

## 2023-03-07 DIAGNOSIS — G8929 Other chronic pain: Secondary | ICD-10-CM

## 2023-03-07 DIAGNOSIS — M25511 Pain in right shoulder: Secondary | ICD-10-CM | POA: Diagnosis not present

## 2023-03-07 NOTE — Therapy (Signed)
OUTPATIENT PHYSICAL THERAPY SHOULDER TREATMENT   Patient Name: Keith Montoya MRN: [id number] DOB:1976-03-18, 47 y.o., male Today's Date: 03/07/2023   PT End of Session - 03/07/23 1104     Visit Number 28    Date for PT Re-Evaluation 04/13/23    Authorization Type BCBS 30 VL    PT Start Time 1103    PT Stop Time 1147    PT Time Calculation (min) 44 min    Activity Tolerance Patient tolerated treatment well    Behavior During Therapy [facility] for tasks assessed/performed                 History reviewed. No pertinent past medical history. History reviewed. No pertinent surgical history. There are no problems to display for this patient.   REFERRING PROVIDER: Romero Belling, MD  Madelyn Brunner, DO performing injections  REFERRING DIAG:  M25.511 (ICD-10-CM) - Right shoulder pain  M67.813 (ICD-10-CM) - Other specified disorders of tendon, right shoulder  M54.2 (ICD-10-CM) - Cervicalgia  M47.896 (ICD-10-CM) - Other spondylosis, lumbar region    THERAPY DIAG:  Chronic right shoulder pain  Pain in left leg  Chronic left-sided low back pain, unspecified whether sciatica present  Rationale for Evaluation and Treatment Rehabilitation  ONSET DATE: chronic  SUBJECTIVE:                                                                                                                                                                                      SUBJECTIVE STATEMENT: Played the last 2 nights without numbess/sensation changes. I feel like I have more mobility, confidence and acceleration above my head in serving. Getting some low back/SIJ tightness and discomfort resulting in fatigue setting in. Doing really well for the first hour or so but lose athleticism decreases with endurance challenges. SIJ soreness holding on to the next day. Using stretches and ice for maintenance. Day to day routine would be good to improve.    PERTINENT HISTORY: Rt elbow surgery  PAIN:  Are you  having pain? Yes: NPRS scale: 0/10 Pain location: post Rt shoulder Pain description: tight/stuck Aggravating factors:  tennis, pickle ball Relieving factors: DN, exercises, pillow on anterior shoulder when laying prone   PRECAUTIONS: None  WEIGHT BEARING RESTRICTIONS No  FALLS:  Has patient fallen in last 6 months? No  PLOF: Independent  PATIENT GOALS decrease pain, back to sports/fitness  OBJECTIVE:   POSTURE: Lateral tracking Lt patella in standing Superior Lt sacral quadrant anteriorly rotated on oblique axis  UPPER EXTREMITY ROM:   Gross hypermobility throughout body  EXTREMITY MMT:  MMT (lb) Right eval Left eval Rt/Lt  Rt/Lt 4/8 Rt/Lt 4/17  Shoulder flexion  Shoulder extension       Shoulder abduction       Shoulder adduction       Shoulder internal rotation       Shoulder external rotation 23 22.3 27.2 / 23.1 27.4/23.9 27.2/27.9  Knee extension 57.9 59.2 64.6 / 65.2    Grip strength     85/80  (Blank rows = not tested)   JOINT MOBILITY TESTING:  hypermobility  PALPATION:  Spasm in Rt infraspinatus, lats, subscap; VMO, VL   TODAY'S TREATMENT:   Treatment                            4/24:  Trigger Point Dry Needling, Manual Therapy Treatment:  Initial or subsequent education regarding Trigger Point Dry Needling: Subsequent Did patient give consent to treatment with Trigger Point Dry Needling: Yes TPDN with skilled palpation and monitoring followed by STM to the following muscles: Rt upper trap, infraspinatus, teres, lats; bil upper fibers of glut max lateral to SIJ.   Ktape for deltoid support All 4 belly lift walk Triceps ext green tband Green tband single arm lat pull down with opp UE OH Plank on knees, green tband single arm row   Treatment                            02/28/23:  Trigger Point Dry Needling, Manual Therapy Treatment:  Initial or subsequent education regarding Trigger Point Dry Needling: Subsequent Did patient give consent  to treatment with Trigger Point Dry Needling: Yes TPDN with skilled palpation and monitoring followed by STM to the following muscles: Rt lumbar paraspinals, Rt infraspinatus, teres, subscap  Ktape for deltoid support- education for wife to apply at home 90/90 ER/IR required PT support Discussion regarding injection outcome and resulting effects   Treatment                            02/19/23:  Trigger Point Dry Needling, Manual Therapy Treatment:  Initial or subsequent education regarding Trigger Point Dry Needling: Subsequent Did patient give consent to treatment with Trigger Point Dry Needling: Yes TPDN with skilled palpation and monitoring followed by STM to the following muscles: Rt upper trap, infraspinatus, teres, lats, post deltoid  Rt first rib mob, STM Rt lower thoracic/lumbar paraspinals & QL PRI all 4 belly lift walk Long sitting roll back Quadruped thoracic rotation- avoiding excessive horiz abd    PATIENT EDUCATION: Education details: Teacher, music of condition, POC, HEP, exercise form/rationale Person educated: Patient Education method: Explanation and Verbal cues Education comprehension: verbalized understanding and needs further education   HOME EXERCISE PROGRAM: Previous HEP- [id number] PRI: Rt Sidelying clams- feet pressed into wall for LE 90/90, ball bw ankles and added adduction of Rt knee PRI: long seated supported press down with Right iliacus and psoas PRI all 4 belly lift walk Resting stance with left foot slightly back  ASSESSMENT:  CLINICAL IMPRESSION:  Continued improvement in pain levels and activity confidence. Decreased grip strength with fatigue and tightness but resolved following DN today. F/u with Dr Shon Baton tomorrow and will continue with functional and strengthening exercises/postures. Notable decrease in CKC stability in qped and planks- will benefit from further core strengthening for proximal support.    OBJECTIVE IMPAIRMENTS decreased activity  tolerance, decreased strength, increased muscle spasms, impaired UE functional use, improper body mechanics, postural dysfunction, and pain.   ACTIVITY  LIMITATIONS carrying, lifting, standing, squatting, sleeping, stairs, and locomotion level  PARTICIPATION LIMITATIONS: driving, community activity, occupation, and yard work  PERSONAL FACTORS Time since onset of injury/illness/exacerbation and 1 comorbidity: general hypermobility  are also affecting patient's functional outcome.     GOALS: Goals reviewed with patient? Yes  SHORT TERM GOALS: Target date: 09/16/22  Will return to daily compliance with HEP Baseline: asked that he focus on 3 PRI exercises Goal status:met   LONG TERM GOALS: Target date: POC date  Able to sleep without limitation by thigh pain Baseline: sleeping is improving, will be able to reassess when I start playing again Goal status: ongoing 1/29   2.  Able to return to pickle ball Baseline: was able to go back to playing since last visit but limited tolerance Goal status: ongoing  3.  GHJ ER strength to improve by 15% bilaterally  Baseline: see chart Goal status: ongoing    PLAN: PT FREQUENCY: 1-2x/week  PT DURATION: through May  PLANNED INTERVENTIONS: Therapeutic exercises, Therapeutic activity, Neuromuscular re-education, Balance training, Patient/Family education, Self Care, Joint mobilization, Aquatic Therapy, Dry Needling, Electrical stimulation, Spinal mobilization, Cryotherapy, Moist heat, Taping, Traction, Ionotophoresis /ml Dexamethasone, Manual therapy, and Re-evaluation  PLAN FOR NEXT SESSION: DN PRN. Upright balance with rotation, core.   Bunny Kleist C. Jahmiya Guidotti PT, DPT 03/07/23 7:54 PM

## 2023-03-08 ENCOUNTER — Encounter: Payer: Self-pay | Admitting: Sports Medicine

## 2023-03-08 ENCOUNTER — Ambulatory Visit: Payer: BC Managed Care – PPO | Admitting: Sports Medicine

## 2023-03-08 DIAGNOSIS — M25511 Pain in right shoulder: Secondary | ICD-10-CM

## 2023-03-08 DIAGNOSIS — G8929 Other chronic pain: Secondary | ICD-10-CM

## 2023-03-08 NOTE — Progress Notes (Signed)
Doing better; injection did help States he is more mobile

## 2023-03-08 NOTE — Progress Notes (Signed)
Keith Montoya - 47 y.o. male MRN [id number]  Date of birth: 12-29-1975  Office Visit Note: Visit Date: 03/08/2023 PCP: Pcp, No Referred by: No ref. provider found  Subjective: Chief Complaint  Patient presents with   Right Shoulder - Follow-up   HPI: Keith Montoya is a pleasant 47 y.o. 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  Keith Montoya states that he had rather significant relief from the quadrilateral space injection that we performed 3 weeks ago.  He certainly had greater than 50% relief on a daily basis and even further improvement with activities with overhead activity and playing tennis/serving.  Noticed he was able to get the arm above the shoulder with more mobility and have slightly more velocity with this motion in his serve.  Does continue working with physical therapy, Orthoptist.  Prior Work-up:  - MRI shoulder 12/2016: Normal RTC, s/p AC debridement - NCS/EMG 2020: ? Reportedly normal - MRA 10/2020: Mild supraspinatus tendinosis w/articular surface fraying; no labral tear, mild AC OA  Pertinent ROS were reviewed with the patient and found to be negative unless otherwise specified above in HPI.   Assessment & Plan: Visit Diagnoses:  1. 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  He will continue with his formal physical therapy as well as home therapy.  Would like him to continue working on rotator cuff strengthening and stability as well as scapular stabilization.  Would like him to keep the posterior capsule stretched as well with wall/corner stretching, sleeper stretch, scapular retraction and other exercises per Loretha Brasil recommendations.  He will work on increasing his  muscle endurance with appropriate firing of the scapula and surrounding rotator cuff muscles.  We can perform infrequent injections into this area, although this is not something that would would need recurrent injections.  He will finish up his physical therapy sessions around June, if that time if he is having regression or is not noticing further improvement we may consider an additional quadrilateral space injection at that time, otherwise we will continue PT and his home therapy.  Follow-up: Return if symptoms worsen or fail to improve.   Meds & Orders: No orders of the defined types were placed in this encounter.  No orders of the defined types were placed in this encounter.    Procedures: No procedures performed      Clinical History: No specialty comments available.  He reports that he has never smoked. He has never used smokeless tobacco. No results for input(s): "HGBA1C", "LABURIC" in the last 8760 hours.  Objective:   Vital Signs: There were no vitals taken for this visit.  Physical Exam  Gen: Well-appearing, in no acute distress; non-toxic CV: Regular Rate. Well-perfused. Warm.  Resp: Breathing unlabored on room air; no wheezing. Psych: Fluid speech in conversation; appropriate affect; normal thought process Neuro: Sensation intact throughout. No gross coordination deficits.   Ortho Exam - Right shoulder: No significant bony TTP.  There is no real pain today with Hornblower's or resisted external rotation with the elbow at 90 degrees.  No true scapular dyskinesis, although the surrounding musculature of the medial border of the right scapular is less defined than the left side.  Very mild teres minor atrophy right greater than left but  still preserved muscle bulk.  Imaging: No results found.  Past Medical/Family/Surgical/Social History: Medications & Allergies reviewed per EMR, new medications updated. There are no problems to display for this patient.  No past medical  history on file. No family history on file. No past surgical history on file. Social History   Occupational History   Not on file  Tobacco Use   Smoking status: Never   Smokeless tobacco: Never  Substance and Sexual Activity   Alcohol use: Not on file   Drug use: Not on file   Sexual activity: Not on file

## 2023-03-14 ENCOUNTER — Ambulatory Visit (HOSPITAL_BASED_OUTPATIENT_CLINIC_OR_DEPARTMENT_OTHER): Payer: BC Managed Care – PPO | Attending: Physical Medicine and Rehabilitation | Admitting: Physical Therapy

## 2023-03-14 ENCOUNTER — Encounter (HOSPITAL_BASED_OUTPATIENT_CLINIC_OR_DEPARTMENT_OTHER): Payer: Self-pay | Admitting: Physical Therapy

## 2023-03-14 DIAGNOSIS — G8929 Other chronic pain: Secondary | ICD-10-CM | POA: Diagnosis present

## 2023-03-14 DIAGNOSIS — M25511 Pain in right shoulder: Secondary | ICD-10-CM | POA: Diagnosis present

## 2023-03-14 DIAGNOSIS — M79605 Pain in left leg: Secondary | ICD-10-CM | POA: Insufficient documentation

## 2023-03-14 DIAGNOSIS — M542 Cervicalgia: Secondary | ICD-10-CM | POA: Diagnosis present

## 2023-03-14 DIAGNOSIS — M5459 Other low back pain: Secondary | ICD-10-CM | POA: Insufficient documentation

## 2023-03-14 DIAGNOSIS — M545 Low back pain, unspecified: Secondary | ICD-10-CM | POA: Insufficient documentation

## 2023-03-14 NOTE — Therapy (Signed)
OUTPATIENT PHYSICAL THERAPY SHOULDER TREATMENT   Patient Name: Keith Montoya MRN: [id number] DOB:05/30/1976, 47 y.o., male Today's Date: 03/14/2023   PT End of Session - 03/14/23 1148     Visit Number 29    Date for PT Re-Evaluation 04/13/23    Authorization Type [employer] 30 VL    PT Start Time 1149    PT Stop Time 1229    PT Time Calculation (min) 40 min    Activity Tolerance Patient tolerated treatment well    Behavior During Therapy [facility] for tasks assessed/performed                 History reviewed. No pertinent past medical history. History reviewed. No pertinent surgical history. There are no problems to display for this patient.   REFERRING PROVIDER: Romero Belling, MD  Madelyn Brunner, DO performing injections  REFERRING DIAG:  M25.511 (ICD-10-CM) - Right shoulder pain  M67.813 (ICD-10-CM) - Other specified disorders of tendon, right shoulder  M54.2 (ICD-10-CM) - Cervicalgia  M47.896 (ICD-10-CM) - Other spondylosis, lumbar region    THERAPY DIAG:  Chronic right shoulder pain  Pain in left leg  Chronic left-sided low back pain, unspecified whether sciatica present  Rationale for Evaluation and Treatment Rehabilitation  ONSET DATE: chronic  SUBJECTIVE:                                                                                                                                                                                      SUBJECTIVE STATEMENT: Played tennis sat, hit Sunday for about an hour, played pickle ball Monday and tennis yesterday. Still wearing shoulder brace during sport and heavy activity such as yard work. The last 3 times I have lost the sesnaion in my hand, it has been on a tues after multiple days in a row of use. Noted that one incident of backhand, overhead hit created distal symptoms.    PERTINENT HISTORY: Rt elbow surgery  PAIN:  Are you having pain? Yes: NPRS scale: 0/10 Pain location: post Rt shoulder Pain description:  tight/stuck Aggravating factors:  tennis, pickle ball Relieving factors: DN, exercises, pillow on anterior shoulder when laying prone   PRECAUTIONS: None  WEIGHT BEARING RESTRICTIONS No  FALLS:  Has patient fallen in last 6 months? No  PLOF: Independent  PATIENT GOALS decrease pain, back to sports/fitness  OBJECTIVE:   POSTURE: Lateral tracking Lt patella in standing Superior Lt sacral quadrant anteriorly rotated on oblique axis  UPPER EXTREMITY ROM:   Gross hypermobility throughout body  EXTREMITY MMT:  MMT (lb) Right eval Left eval Rt/Lt  Rt/Lt 4/8 Rt/Lt 4/17  Shoulder flexion       Shoulder extension  Shoulder abduction       Shoulder adduction       Shoulder internal rotation       Shoulder external rotation 23 22.3 27.2 / 23.1 27.4/23.9 27.2/27.9  Knee extension 57.9 59.2 64.6 / 65.2    Grip strength     85/80  (Blank rows = not tested)   JOINT MOBILITY TESTING:  hypermobility  PALPATION:  Spasm in Rt infraspinatus, lats, subscap; VMO, VL   TODAY'S TREATMENT:   Treatment                            5/1:  Trigger Point Dry Needling, Manual Therapy Treatment:  Initial or subsequent education regarding Trigger Point Dry Needling: Subsequent Did patient give consent to treatment with Trigger Point Dry Needling: Yes TPDN with skilled palpation and monitoring followed by STM to the following muscles: Rt teres, infraspinatus  Prone rib mobs with cavitation on Lt upper thoracic region Educated on rolling for strain on post shoulder musculature Eccentric D1 flexion Ktape: deltoid bellies from distal to prximal insertion overlap   Treatment                            4/24:  Trigger Point Dry Needling, Manual Therapy Treatment:  Initial or subsequent education regarding Trigger Point Dry Needling: Subsequent Did patient give consent to treatment with Trigger Point Dry Needling: Yes TPDN with skilled palpation and monitoring followed by STM to the  following muscles: Rt upper trap, infraspinatus, teres, lats; bil upper fibers of glut max lateral to SIJ.   Ktape for deltoid support All 4 belly lift walk Triceps ext green tband Green tband single arm lat pull down with opp UE OH Plank on knees, green tband single arm row   Treatment                            02/28/23:  Trigger Point Dry Needling, Manual Therapy Treatment:  Initial or subsequent education regarding Trigger Point Dry Needling: Subsequent Did patient give consent to treatment with Trigger Point Dry Needling: Yes TPDN with skilled palpation and monitoring followed by STM to the following muscles: Rt lumbar paraspinals, Rt infraspinatus, teres, subscap  Ktape for deltoid support- education for wife to apply at home 90/90 ER/IR required PT support Discussion regarding injection outcome and resulting effects   Treatment                            02/19/23:  Trigger Point Dry Needling, Manual Therapy Treatment:  Initial or subsequent education regarding Trigger Point Dry Needling: Subsequent Did patient give consent to treatment with Trigger Point Dry Needling: Yes TPDN with skilled palpation and monitoring followed by STM to the following muscles: Rt upper trap, infraspinatus, teres, lats, post deltoid  Rt first rib mob, STM Rt lower thoracic/lumbar paraspinals & QL PRI all 4 belly lift walk Long sitting roll back Quadruped thoracic rotation- avoiding excessive horiz abd    PATIENT EDUCATION: Education details: Teacher, music of condition, POC, HEP, exercise form/rationale Person educated: Patient Education method: Explanation and Verbal cues Education comprehension: verbalized understanding and needs further education   HOME EXERCISE PROGRAM: Previous HEP- [id number] PRI: Rt Sidelying clams- feet pressed into wall for LE 90/90, ball bw ankles and added adduction of Rt knee PRI: long seated supported press down with  Right iliacus and psoas PRI all 4 belly lift  walk Resting stance with left foot slightly back  ASSESSMENT:  CLINICAL IMPRESSION:  More feeling in hand following treatment today. Tension placed on post musculture and neural trunk with add reach during tennis. Cues to avoid shoulder hike in eccentric motion as fatigue set in.  OBJECTIVE IMPAIRMENTS decreased activity tolerance, decreased strength, increased muscle spasms, impaired UE functional use, improper body mechanics, postural dysfunction, and pain.   ACTIVITY LIMITATIONS carrying, lifting, standing, squatting, sleeping, stairs, and locomotion level  PARTICIPATION LIMITATIONS: driving, community activity, occupation, and yard work  PERSONAL FACTORS Time since onset of injury/illness/exacerbation and 1 comorbidity: general hypermobility  are also affecting patient's functional outcome.     GOALS: Goals reviewed with patient? Yes  SHORT TERM GOALS: Target date: 09/16/22  Will return to daily compliance with HEP Baseline: asked that he focus on 3 PRI exercises Goal status:met   LONG TERM GOALS: Target date: POC date  Able to sleep without limitation by thigh pain Baseline: sleeping is improving, will be able to reassess when I start playing again Goal status: ongoing 1/29   2.  Able to return to pickle ball Baseline: was able to go back to playing since last visit but limited tolerance Goal status: ongoing  3.  GHJ ER strength to improve by 15% bilaterally  Baseline: see chart Goal status: ongoing    PLAN: PT FREQUENCY: 1-2x/week  PT DURATION: through May  PLANNED INTERVENTIONS: Therapeutic exercises, Therapeutic activity, Neuromuscular re-education, Balance training, Patient/Family education, Self Care, Joint mobilization, Aquatic Therapy, Dry Needling, Electrical stimulation, Spinal mobilization, Cryotherapy, Moist heat, Taping, Traction, Ionotophoresis 4mg /ml Dexamethasone, Manual therapy, and Re-evaluation  PLAN FOR NEXT SESSION: DN PRN. Upright balance  with rotation, core.   Paylin Hailu C. Emeree Mahler PT, DPT 03/14/23 12:31 PM

## 2023-03-15 ENCOUNTER — Encounter (HOSPITAL_BASED_OUTPATIENT_CLINIC_OR_DEPARTMENT_OTHER): Payer: BC Managed Care – PPO | Admitting: Physical Therapy

## 2023-03-21 ENCOUNTER — Ambulatory Visit (HOSPITAL_BASED_OUTPATIENT_CLINIC_OR_DEPARTMENT_OTHER): Payer: BC Managed Care – PPO | Admitting: Physical Therapy

## 2023-03-21 ENCOUNTER — Encounter (HOSPITAL_BASED_OUTPATIENT_CLINIC_OR_DEPARTMENT_OTHER): Payer: Self-pay | Admitting: Physical Therapy

## 2023-03-21 DIAGNOSIS — M545 Other chronic pain: Secondary | ICD-10-CM

## 2023-03-21 DIAGNOSIS — G8929 Other chronic pain: Secondary | ICD-10-CM

## 2023-03-21 DIAGNOSIS — M5459 Other low back pain: Secondary | ICD-10-CM

## 2023-03-21 DIAGNOSIS — M25511 Pain in right shoulder: Secondary | ICD-10-CM | POA: Diagnosis not present

## 2023-03-21 DIAGNOSIS — M79605 Pain in left leg: Secondary | ICD-10-CM

## 2023-03-21 NOTE — Therapy (Signed)
OUTPATIENT PHYSICAL THERAPY SHOULDER TREATMENT   Patient Name: Keith Montoya MRN: [id number] DOB:[birth year], 47 y.o., male Today's Date: 03/21/2023   PT End of Session - 03/21/23 1141     Visit Number 30    Date for PT Re-Evaluation 04/13/23    Authorization Type [employer] 30 VL    PT Start Time 1142    PT Stop Time 1227    PT Time Calculation (min) 45 min    Activity Tolerance Patient tolerated treatment well    Behavior During Therapy [facility] for tasks assessed/performed                 History reviewed. No pertinent past medical history. History reviewed. No pertinent surgical history. There are no problems to display for this patient.   REFERRING PROVIDER: Romero Belling, MD  Madelyn Brunner, DO performing injections  REFERRING DIAG:  M25.511 (ICD-10-CM) - Right shoulder pain  M67.813 (ICD-10-CM) - Other specified disorders of tendon, right shoulder  M54.2 (ICD-10-CM) - Cervicalgia  M47.896 (ICD-10-CM) - Other spondylosis, lumbar region    THERAPY DIAG:  Chronic right shoulder pain  Pain in left leg  Chronic left-sided low back pain, unspecified whether sciatica present  Other low back pain  Rationale for Evaluation and Treatment Rehabilitation  ONSET DATE: chronic  SUBJECTIVE:                                                                                                                                                                                      SUBJECTIVE STATEMENT: Match-wise was ok, no hand issue. I did not play over the weekend because of rain. Horiz abd and overhead add across midline feels tight in teres region. None of the hip issue, mainly just lower back and into bil hips feels some tightness/discomfort.    PERTINENT HISTORY: Rt elbow surgery  PAIN:  Are you having pain? Yes: NPRS scale: 0/10 Pain location: post Rt shoulder Pain description: tight/stuck Aggravating factors:  tennis, pickle ball Relieving factors: DN, exercises, pillow on  anterior shoulder when laying prone   PRECAUTIONS: None  WEIGHT BEARING RESTRICTIONS No  FALLS:  Has patient fallen in last 6 months? No  PLOF: Independent  PATIENT GOALS decrease pain, back to sports/fitness  OBJECTIVE:   POSTURE: Lateral tracking Lt patella in standing Superior Lt sacral quadrant anteriorly rotated on oblique axis  UPPER EXTREMITY ROM:   Gross hypermobility throughout body  EXTREMITY MMT:  MMT (lb) Right eval Left eval Rt/Lt  Rt/Lt 4/8 Rt/Lt 4/17 Rt 5/8  Shoulder flexion        Shoulder extension        Shoulder abduction  Shoulder adduction        Shoulder internal rotation        Shoulder external rotation 23 22.3 27.2 / 23.1 27.4/23.9 27.2/27.9   Knee extension 57.9 59.2 64.6 / 65.2     Grip strength     85/80 85 upright, 70 in slouch  (Blank rows = not tested)   JOINT MOBILITY TESTING:  hypermobility  PALPATION:  Spasm in Rt infraspinatus, lats, subscap; VMO, VL   TODAY'S TREATMENT:   Treatment                            5/8:  Trigger Point Dry Needling, Manual Therapy Treatment:  Initial or subsequent education regarding Trigger Point Dry Needling: Subsequent Did patient give consent to treatment with Trigger Point Dry Needling: Yes TPDN with skilled palpation and monitoring followed by STM to the following muscles: Lt upper traps, Rt infraspinatus, Rt teres, Rt lats  Prone Lt rib inf mobs, gross rib ER mob through thoracic spine.  Supine horiz abd/add 5lb kettle bell Supine shoulder flexion 5lb kettle bell Rt SL Lt UE horiz abd add to 90 5lb kettle bell, paused to perform isometric elevation & depression with shoulder at 90 horiz abd, resumed without weight All 4 belly lift walk- PRI   Treatment                            5/1:  Trigger Point Dry Needling, Manual Therapy Treatment:  Initial or subsequent education regarding Trigger Point Dry Needling: Subsequent Did patient give consent to treatment with Trigger Point  Dry Needling: Yes TPDN with skilled palpation and monitoring followed by STM to the following muscles: Rt teres, infraspinatus  Prone rib mobs with cavitation on Lt upper thoracic region Educated on rolling for strain on post shoulder musculature Eccentric D1 flexion Ktape: deltoid bellies from distal to prximal insertion overlap   Treatment                            4/24:  Trigger Point Dry Needling, Manual Therapy Treatment:  Initial or subsequent education regarding Trigger Point Dry Needling: Subsequent Did patient give consent to treatment with Trigger Point Dry Needling: Yes TPDN with skilled palpation and monitoring followed by STM to the following muscles: Rt upper trap, infraspinatus, teres, lats; bil upper fibers of glut max lateral to SIJ.   Ktape for deltoid support All 4 belly lift walk Triceps ext green tband Green tband single arm lat pull down with opp UE OH Plank on knees, green tband single arm row    PATIENT EDUCATION: Education details: Teacher, music of condition, POC, HEP, exercise form/rationale Person educated: Patient Education method: Explanation and Verbal cues Education comprehension: verbalized understanding and needs further education   HOME EXERCISE PROGRAM: Previous HEP- [id number] PRI: Rt Sidelying clams- feet pressed into wall for LE 90/90, ball bw ankles and added adduction of Rt knee PRI: long seated supported press down with Right iliacus and psoas PRI all 4 belly lift walk Resting stance with left foot slightly back  ASSESSMENT:  CLINICAL IMPRESSION:  Sidelying balance of head of humerus was difficult- tends to abd past neutral creating anterior shift of head of humerus and activation of upper traps for stability.  Extending plan of care with visits weekly to every other week in order to monitor progression of endurance through tennis as that  situation cannot be created in office.   OBJECTIVE IMPAIRMENTS decreased activity tolerance, decreased  strength, increased muscle spasms, impaired UE functional use, improper body mechanics, postural dysfunction, and pain.   ACTIVITY LIMITATIONS carrying, lifting, standing, squatting, sleeping, stairs, and locomotion level  PARTICIPATION LIMITATIONS: driving, community activity, occupation, and yard work  PERSONAL FACTORS Time since onset of injury/illness/exacerbation and 1 comorbidity: general hypermobility  are also affecting patient's functional outcome.     GOALS: Goals reviewed with patient? Yes  SHORT TERM GOALS: Target date: 09/16/22  Will return to daily compliance with HEP Baseline: asked that he focus on 3 PRI exercises Goal status:met   LONG TERM GOALS: Target date: POC date  Able to sleep without limitation by thigh pain Baseline: sleeping is improving, will be able to reassess when I start playing again Goal status: ongoing 1/29   2.  Able to return to pickle ball Baseline: was able to go back to playing since last visit but limited tolerance Goal status: ongoing  3.  GHJ ER strength to improve by 15% bilaterally  Baseline: see chart Goal status: ongoing    PLAN: PT FREQUENCY: 1-2x/week  PT DURATION: through May  PLANNED INTERVENTIONS: Therapeutic exercises, Therapeutic activity, Neuromuscular re-education, Balance training, Patient/Family education, Self Care, Joint mobilization, Aquatic Therapy, Dry Needling, Electrical stimulation, Spinal mobilization, Cryotherapy, Moist heat, Taping, Traction, Ionotophoresis 4mg /ml Dexamethasone, Manual therapy, and Re-evaluation  PLAN FOR NEXT SESSION: DN PRN. Upright balance with rotation, core.   Megan Hayduk C. Gertha Lichtenberg PT, DPT 03/21/23 1:55 PM

## 2023-03-22 ENCOUNTER — Encounter (HOSPITAL_BASED_OUTPATIENT_CLINIC_OR_DEPARTMENT_OTHER): Payer: BC Managed Care – PPO | Admitting: Physical Therapy

## 2023-03-27 ENCOUNTER — Encounter (HOSPITAL_BASED_OUTPATIENT_CLINIC_OR_DEPARTMENT_OTHER): Payer: BC Managed Care – PPO | Admitting: Physical Therapy

## 2023-03-29 ENCOUNTER — Encounter (HOSPITAL_BASED_OUTPATIENT_CLINIC_OR_DEPARTMENT_OTHER): Payer: BC Managed Care – PPO | Admitting: Physical Therapy

## 2023-04-04 ENCOUNTER — Ambulatory Visit (HOSPITAL_BASED_OUTPATIENT_CLINIC_OR_DEPARTMENT_OTHER): Payer: BC Managed Care – PPO | Admitting: Physical Therapy

## 2023-04-04 ENCOUNTER — Encounter (HOSPITAL_BASED_OUTPATIENT_CLINIC_OR_DEPARTMENT_OTHER): Payer: Self-pay | Admitting: Physical Therapy

## 2023-04-04 DIAGNOSIS — M542 Cervicalgia: Secondary | ICD-10-CM

## 2023-04-04 DIAGNOSIS — M5459 Other low back pain: Secondary | ICD-10-CM

## 2023-04-04 DIAGNOSIS — M79605 Pain in left leg: Secondary | ICD-10-CM

## 2023-04-04 DIAGNOSIS — G8929 Other chronic pain: Secondary | ICD-10-CM

## 2023-04-04 DIAGNOSIS — M25511 Pain in right shoulder: Secondary | ICD-10-CM | POA: Diagnosis not present

## 2023-04-04 NOTE — Therapy (Signed)
OUTPATIENT PHYSICAL THERAPY SHOULDER TREATMENT   Patient Name: Keith Montoya MRN: [id number] DOB:07-Sep-1976, 47 y.o., male Today's Date: 04/04/2023   PT End of Session - 04/04/23 1151     Visit Number 31    Date for PT Re-Evaluation 04/13/23    Authorization Type BCBS 30 VL    PT Start Time 1150    PT Stop Time 1230    PT Time Calculation (min) 40 min    Activity Tolerance Patient tolerated treatment well    Behavior During Therapy [facility] for tasks assessed/performed                 History reviewed. No pertinent past medical history. History reviewed. No pertinent surgical history. There are no problems to display for this patient.   REFERRING PROVIDER: Romero Belling, MD  Madelyn Brunner, DO performing injections  REFERRING DIAG:  M25.511 (ICD-10-CM) - Right shoulder pain  M67.813 (ICD-10-CM) - Other specified disorders of tendon, right shoulder  M54.2 (ICD-10-CM) - Cervicalgia  M47.896 (ICD-10-CM) - Other spondylosis, lumbar region    THERAPY DIAG:  Pain in left leg  Chronic right shoulder pain  Chronic left-sided low back pain, unspecified whether sciatica present  Other low back pain  Cervicalgia  Rationale for Evaluation and Treatment Rehabilitation  ONSET DATE: chronic  SUBJECTIVE:                                                                                                                                                                                      SUBJECTIVE STATEMENT: Played golf  Thursday and felt very sore for the next few days after with peak at sunday. The neck and post shoulder were very tight and a little ITB discomfort but not persistent. Played pickle ball Monday and was fine.    PERTINENT HISTORY: Rt elbow surgery  PAIN:  Are you having pain? Yes: NPRS scale: 0/10 Pain location: post Rt shoulder Pain description: tight/stuck Aggravating factors:  tennis, pickle ball Relieving factors: DN, exercises, pillow on anterior  shoulder when laying prone   PRECAUTIONS: None  WEIGHT BEARING RESTRICTIONS No  FALLS:  Has patient fallen in last 6 months? No  PLOF: Independent  PATIENT GOALS decrease pain, back to sports/fitness  OBJECTIVE:   POSTURE: Lateral tracking Lt patella in standing Superior Lt sacral quadrant anteriorly rotated on oblique axis  UPPER EXTREMITY ROM:   Gross hypermobility throughout body  EXTREMITY MMT:  MMT (lb) Right eval Left eval Rt/Lt  Rt/Lt 4/8 Rt/Lt 4/17 Rt 5/8  Shoulder flexion        Shoulder extension        Shoulder abduction  Shoulder adduction        Shoulder internal rotation        Shoulder external rotation 23 22.3 27.2 / 23.1 27.4/23.9 27.2/27.9   Knee extension 57.9 59.2 64.6 / 65.2     Grip strength     85/80 85 upright, 70 in slouch  (Blank rows = not tested)   JOINT MOBILITY TESTING:  hypermobility  PALPATION:  Spasm in Rt infraspinatus, lats, subscap; VMO, VL   TODAY'S TREATMENT:   Treatment                            5/22 :  Trigger Point Dry Needling, Manual Therapy Treatment:  Initial or subsequent education regarding Trigger Point Dry Needling: Subsequent Did patient give consent to treatment with Trigger Point Dry Needling: Yes TPDN with skilled palpation and monitoring followed by STM to the following muscles: lats, infraspinatus, teres, upper trap bil  Ktape- deltoid, educated wife on application Anchored IR/ER at 90 abd D1, D2 UE standing with 2lb   Treatment                            5/8:  Trigger Point Dry Needling, Manual Therapy Treatment:  Initial or subsequent education regarding Trigger Point Dry Needling: Subsequent Did patient give consent to treatment with Trigger Point Dry Needling: Yes TPDN with skilled palpation and monitoring followed by STM to the following muscles: Lt upper traps, Rt infraspinatus, Rt teres, Rt lats  Prone Lt rib inf mobs, gross rib ER mob through thoracic spine.  Supine horiz abd/add  5lb kettle bell Supine shoulder flexion 5lb kettle bell Rt SL Lt UE horiz abd add to 90 5lb kettle bell, paused to perform isometric elevation & depression with shoulder at 90 horiz abd, resumed without weight All 4 belly lift walk- PRI    PATIENT EDUCATION: Education details: Teacher, music of condition, POC, HEP, exercise form/rationale Person educated: Patient Education method: Explanation and Verbal cues Education comprehension: verbalized understanding and needs further education   HOME EXERCISE PROGRAM: Previous HEP- [id number] PRI: Rt Sidelying clams- feet pressed into wall for LE 90/90, ball bw ankles and added adduction of Rt knee PRI: long seated supported press down with Right iliacus and psoas PRI all 4 belly lift walk Resting stance with left foot slightly back  ASSESSMENT:  CLINICAL IMPRESSION:  Twitch response in teres and infraspinatus reduced concordant pain.  Bilateral upper trap significantly spasmed and reduced with trigger point dry needling.  Patient continues to lack trunk rotation necessary for a golf swing to avoid impingement of glenohumeral joint but reports he has done very well with pickleball starts and swings.  OBJECTIVE IMPAIRMENTS decreased activity tolerance, decreased strength, increased muscle spasms, impaired UE functional use, improper body mechanics, postural dysfunction, and pain.   ACTIVITY LIMITATIONS carrying, lifting, standing, squatting, sleeping, stairs, and locomotion level  PARTICIPATION LIMITATIONS: driving, community activity, occupation, and yard work  PERSONAL FACTORS Time since onset of injury/illness/exacerbation and 1 comorbidity: general hypermobility  are also affecting patient's functional outcome.     GOALS: Goals reviewed with patient? Yes  SHORT TERM GOALS: Target date: 09/16/22  Will return to daily compliance with HEP Baseline: asked that he focus on 3 PRI exercises Goal status:met   LONG TERM GOALS: Target date: POC  date  Able to sleep without limitation by thigh pain Baseline: sleeping is improving, will be able to reassess when I start  playing again Goal status: ongoing 1/29   2.  Able to return to pickle ball Baseline: was able to go back to playing since last visit but limited tolerance Goal status: ongoing  3.  GHJ ER strength to improve by 15% bilaterally  Baseline: see chart Goal status: ongoing    PLAN: PT FREQUENCY: 1-2x/week  PT DURATION: through May  PLANNED INTERVENTIONS: Therapeutic exercises, Therapeutic activity, Neuromuscular re-education, Balance training, Patient/Family education, Self Care, Joint mobilization, Aquatic Therapy, Dry Needling, Electrical stimulation, Spinal mobilization, Cryotherapy, Moist heat, Taping, Traction, Ionotophoresis 4mg /ml Dexamethasone, Manual therapy, and Re-evaluation  PLAN FOR NEXT SESSION: DN PRN. Upright balance with rotation, core.   Noura Purpura C. Annasofia Pohl PT, DPT 04/04/23 8:05 PM

## 2023-04-05 ENCOUNTER — Encounter (HOSPITAL_BASED_OUTPATIENT_CLINIC_OR_DEPARTMENT_OTHER): Payer: BC Managed Care – PPO | Admitting: Physical Therapy

## 2023-04-11 ENCOUNTER — Ambulatory Visit (HOSPITAL_BASED_OUTPATIENT_CLINIC_OR_DEPARTMENT_OTHER): Payer: BC Managed Care – PPO | Admitting: Physical Therapy

## 2023-04-11 ENCOUNTER — Encounter (HOSPITAL_BASED_OUTPATIENT_CLINIC_OR_DEPARTMENT_OTHER): Payer: Self-pay | Admitting: Physical Therapy

## 2023-04-11 DIAGNOSIS — M5459 Other low back pain: Secondary | ICD-10-CM

## 2023-04-11 DIAGNOSIS — M545 Low back pain, unspecified: Secondary | ICD-10-CM

## 2023-04-11 DIAGNOSIS — G8929 Other chronic pain: Secondary | ICD-10-CM

## 2023-04-11 DIAGNOSIS — M542 Cervicalgia: Secondary | ICD-10-CM

## 2023-04-11 DIAGNOSIS — M79605 Pain in left leg: Secondary | ICD-10-CM

## 2023-04-11 DIAGNOSIS — M25511 Pain in right shoulder: Secondary | ICD-10-CM | POA: Diagnosis not present

## 2023-04-11 NOTE — Addendum Note (Signed)
Addended by: Fredrich Romans on: 04/11/2023 02:21 PM   Modules accepted: Orders

## 2023-04-11 NOTE — Therapy (Addendum)
OUTPATIENT PHYSICAL THERAPY SHOULDER TREATMENT   Patient Name: Keith Montoya MRN: [id number] DOB:February 03, 1976, 47 y.o., male Today's Date: 04/11/2023   PT End of Session - 04/11/23 1141     Visit Number 32    Date for PT Re-Evaluation 05/11/23    Authorization Type [employer] 30 VL/year    PT Start Time 1141    PT Stop Time 1223    PT Time Calculation (min) 42 min    Activity Tolerance Patient tolerated treatment well    Behavior During Therapy [facility] for tasks assessed/performed                  History reviewed. No pertinent past medical history. History reviewed. No pertinent surgical history. There are no problems to display for this patient.   REFERRING PROVIDER: Romero Belling, MD  Madelyn Brunner, DO performing injections  REFERRING DIAG:  M25.511 (ICD-10-CM) - Right shoulder pain  M67.813 (ICD-10-CM) - Other specified disorders of tendon, right shoulder  M54.2 (ICD-10-CM) - Cervicalgia  M47.896 (ICD-10-CM) - Other spondylosis, lumbar region    THERAPY DIAG:  Pain in left leg  Chronic right shoulder pain  Chronic left-sided low back pain, unspecified whether sciatica present  Other low back pain  Cervicalgia  Rationale for Evaluation and Treatment Rehabilitation  ONSET DATE: chronic  SUBJECTIVE:                                                                                                                                                                                      SUBJECTIVE STATEMENT: Tightness after golf has not reletned and got into my left leg. I have been trying to stretch but am still really far from where I was. I can tell the shoulder feels tight and sitting forward.    PERTINENT HISTORY: Rt elbow surgery  PAIN:  Are you having pain? Yes: NPRS scale: worst I have felt all year/10 Pain location: post Rt shoulder Pain description: tight Aggravating factors:  tennis, pickle ball Relieving factors: DN, exercises, pillow on anterior  shoulder when laying prone   PRECAUTIONS: None  WEIGHT BEARING RESTRICTIONS No  FALLS:  Has patient fallen in last 6 months? No  PLOF: Independent  PATIENT GOALS decrease pain, back to sports/fitness  OBJECTIVE:   POSTURE: Lateral tracking Lt patella in standing Superior Lt sacral quadrant anteriorly rotated on oblique axis  UPPER EXTREMITY ROM:   Gross hypermobility throughout body  EXTREMITY MMT:  MMT (lb) Right eval Left eval Rt/Lt  Rt/Lt 4/8 Rt/Lt 4/17 Rt 5/8 Rt / Lt 5/29  Shoulder flexion         Shoulder extension         Shoulder  abduction         Shoulder adduction         Shoulder internal rotation         Shoulder external rotation 23 22.3 27.2 / 23.1 27.4/23.9 27.2/27.9  26.1/23.2  Knee extension 57.9 59.2 64.6 / 65.2    61.2/59.4  Grip strength     85/80 85 upright, 70 in slouch 80 in upright and slouch on Rt  (Blank rows = not tested)   JOINT MOBILITY TESTING:  hypermobility  PALPATION:  Spasm in Rt infraspinatus, lats, subscap; VMO, VL   TODAY'S TREATMENT:   Treatment                            5/29:  Trigger Point Dry Needling, Manual Therapy Treatment:  Initial or subsequent education regarding Trigger Point Dry Needling: Subsequent Did patient give consent to treatment with Trigger Point Dry Needling: Yes TPDN with skilled palpation and monitoring followed by STM to the following muscles: rt gluts, Rt lats, bil upper traps, rt levator, Rt rhomboids, Rt teres  Grip strength remained 80lb following DN today Ktape deltoid support, mcconnell tape for Rt upper trap inhibition Prone bil SIJ upper quadrant spring mobs, STM Lt piriformis, TFL, PA fem-acet mobs paired with hip rotation in prone All 4 belly lift walk. Child pose   Treatment                            5/22 :  Trigger Point Dry Needling, Manual Therapy Treatment:  Initial or subsequent education regarding Trigger Point Dry Needling: Subsequent Did patient give consent to  treatment with Trigger Point Dry Needling: Yes TPDN with skilled palpation and monitoring followed by STM to the following muscles: lats, infraspinatus, teres, upper trap bil  Ktape- deltoid, educated wife on application Anchored IR/ER at 90 abd D1, D2 UE standing with 2lb   Treatment                            5/8:  Trigger Point Dry Needling, Manual Therapy Treatment:  Initial or subsequent education regarding Trigger Point Dry Needling: Subsequent Did patient give consent to treatment with Trigger Point Dry Needling: Yes TPDN with skilled palpation and monitoring followed by STM to the following muscles: Lt upper traps, Rt infraspinatus, Rt teres, Rt lats  Prone Lt rib inf mobs, gross rib ER mob through thoracic spine.  Supine horiz abd/add 5lb kettle bell Supine shoulder flexion 5lb kettle bell Rt SL Lt UE horiz abd add to 90 5lb kettle bell, paused to perform isometric elevation & depression with shoulder at 90 horiz abd, resumed without weight All 4 belly lift walk- PRI    PATIENT EDUCATION: Education details: Teacher, music of condition, POC, HEP, exercise form/rationale Person educated: Patient Education method: Explanation and Verbal cues Education comprehension: verbalized understanding and needs further education   HOME EXERCISE PROGRAM: Previous HEP- [id number] PRI: Rt Sidelying clams- feet pressed into wall for LE 90/90, ball bw ankles and added adduction of Rt knee PRI: long seated supported press down with Right iliacus and psoas PRI all 4 belly lift walk Resting stance with left foot slightly back  ASSESSMENT:  CLINICAL IMPRESSION:  Maintaining strength in grip indicating there is not long term impingement in quad space. Able to reduce tightness and pain with manual therapy today and pt will work to train proper  form in exercises.   OBJECTIVE IMPAIRMENTS decreased activity tolerance, decreased strength, increased muscle spasms, impaired UE functional use, improper body  mechanics, postural dysfunction, and pain.   ACTIVITY LIMITATIONS carrying, lifting, standing, squatting, sleeping, stairs, and locomotion level  PARTICIPATION LIMITATIONS: driving, community activity, occupation, and yard work  PERSONAL FACTORS Time since onset of injury/illness/exacerbation and 1 comorbidity: general hypermobility  are also affecting patient's functional outcome.     GOALS: Goals reviewed with patient? Yes  SHORT TERM GOALS: Target date: 09/16/22  Will return to daily compliance with HEP Baseline: asked that he focus on 3 PRI exercises Goal status:met   LONG TERM GOALS: Target date: POC date  Able to sleep without limitation by thigh pain Baseline: sleeping is improving, will be able to reassess when I start playing again Goal status: ongoing 1/29   2.  Able to return to pickle ball Baseline: was able to go back to playing since last visit but limited tolerance Goal status: ongoing  3.  GHJ ER strength to improve by 15% bilaterally  Baseline: see chart Goal status: ongoing    PLAN: PT FREQUENCY: 1-2x/week  PT DURATION: through May  PLANNED INTERVENTIONS: Therapeutic exercises, Therapeutic activity, Neuromuscular re-education, Balance training, Patient/Family education, Self Care, Joint mobilization, Aquatic Therapy, Dry Needling, Electrical stimulation, Spinal mobilization, Cryotherapy, Moist heat, Taping, Traction, Ionotophoresis 4mg /ml Dexamethasone, Manual therapy, and Re-evaluation  PLAN FOR NEXT SESSION: DN PRN. Upright balance with rotation, core.   Dhanvin Szeto C. Ion Gonnella PT, DPT 04/11/23 2:08 PM

## 2023-04-12 ENCOUNTER — Encounter (HOSPITAL_BASED_OUTPATIENT_CLINIC_OR_DEPARTMENT_OTHER): Payer: BC Managed Care – PPO | Admitting: Physical Therapy

## 2023-04-20 ENCOUNTER — Encounter (HOSPITAL_BASED_OUTPATIENT_CLINIC_OR_DEPARTMENT_OTHER): Payer: Self-pay | Admitting: Physical Therapy

## 2023-04-20 ENCOUNTER — Ambulatory Visit (HOSPITAL_BASED_OUTPATIENT_CLINIC_OR_DEPARTMENT_OTHER): Payer: BC Managed Care – PPO | Attending: Physical Medicine and Rehabilitation | Admitting: Physical Therapy

## 2023-04-20 DIAGNOSIS — M5459 Other low back pain: Secondary | ICD-10-CM | POA: Insufficient documentation

## 2023-04-20 DIAGNOSIS — M542 Cervicalgia: Secondary | ICD-10-CM | POA: Insufficient documentation

## 2023-04-20 DIAGNOSIS — M545 Low back pain, unspecified: Secondary | ICD-10-CM | POA: Diagnosis present

## 2023-04-20 DIAGNOSIS — M79605 Pain in left leg: Secondary | ICD-10-CM | POA: Diagnosis present

## 2023-04-20 DIAGNOSIS — G8929 Other chronic pain: Secondary | ICD-10-CM | POA: Diagnosis present

## 2023-04-20 DIAGNOSIS — M25511 Pain in right shoulder: Secondary | ICD-10-CM | POA: Insufficient documentation

## 2023-04-20 NOTE — Therapy (Signed)
OUTPATIENT PHYSICAL THERAPY SHOULDER TREATMENT   Patient Name: Keith Montoya MRN: [id number] DOB:16-Feb-1976, 47 y.o., male Today's Date: 04/20/2023   PT End of Session - 04/20/23 1103     Visit Number 33    Date for PT Re-Evaluation 05/11/23    Authorization Type [employer] 30 VL/year    PT Start Time 1102    PT Stop Time 1144    PT Time Calculation (min) 42 min    Activity Tolerance Patient tolerated treatment well    Behavior During Therapy [facility] for tasks assessed/performed                  History reviewed. No pertinent past medical history. History reviewed. No pertinent surgical history. There are no problems to display for this patient.   REFERRING PROVIDER: Romero Belling, MD  Madelyn Brunner, DO performing injections  REFERRING DIAG:  M25.511 (ICD-10-CM) - Right shoulder pain  M67.813 (ICD-10-CM) - Other specified disorders of tendon, right shoulder  M54.2 (ICD-10-CM) - Cervicalgia  M47.896 (ICD-10-CM) - Other spondylosis, lumbar region    THERAPY DIAG:  Pain in left leg  Chronic right shoulder pain  Chronic left-sided low back pain, unspecified whether sciatica present  Rationale for Evaluation and Treatment Rehabilitation  ONSET DATE: chronic  SUBJECTIVE:                                                                                                                                                                                      SUBJECTIVE STATEMENT: Playing both days next weekend for pickle ball. Bil upper traps have stayed tight and right shoulder. The pre to post golf lower back tightness still won't go away. Lower back tightness brings in the left hip.    PERTINENT HISTORY: Rt elbow surgery 6/7 incr noted in right medial knee pain  PAIN:  Are you having pain? Yes: NPRS scale: not as bad but not great/10 Pain location: bil SIJ Pain description: tight Aggravating factors:  tennis, pickle ball Relieving factors: DN, exercises, pillow on  anterior shoulder when laying prone   PRECAUTIONS: None  WEIGHT BEARING RESTRICTIONS No  FALLS:  Has patient fallen in last 6 months? No  PLOF: Independent  PATIENT GOALS decrease pain, back to sports/fitness  OBJECTIVE:   POSTURE: Lateral tracking Lt patella in standing Superior Lt sacral quadrant anteriorly rotated on oblique axis  UPPER EXTREMITY ROM:   Gross hypermobility throughout body  EXTREMITY MMT:  MMT (lb) Right eval Left eval Rt/Lt  Rt/Lt 4/8 Rt/Lt 4/17 Rt 5/8 Rt / Lt 5/29  Shoulder flexion         Shoulder extension         Shoulder  abduction         Shoulder adduction         Shoulder internal rotation         Shoulder external rotation 23 22.3 27.2 / 23.1 27.4/23.9 27.2/27.9  26.1/23.2  Knee extension 57.9 59.2 64.6 / 65.2    61.2/59.4  Grip strength     85/80 85 upright, 70 in slouch 80 in upright and slouch on Rt  (Blank rows = not tested)   JOINT MOBILITY TESTING:  hypermobility  PALPATION:  Spasm in Rt infraspinatus, lats, subscap; VMO, VL   TODAY'S TREATMENT:   Treatment                            6/7:  Trigger Point Dry Needling, Manual Therapy Treatment:  Initial or subsequent education regarding Trigger Point Dry Needling: Subsequent Did patient give consent to treatment with Trigger Point Dry Needling: No TPDN with skilled palpation and monitoring followed by STM to the following muscles: Lt glut max/med/min; Rt lumbar paraspinals; Rt lats, infraspinatus; bil upper trap  PA and inferior spring mob to Rt upper sacral quadrant All 4 belly lift walk Child pose Hooklying figure 4 with LTR Happy baby pose Ktape rt shoulder deltoid/RC support   Treatment                            5/29:  Trigger Point Dry Needling, Manual Therapy Treatment:  Initial or subsequent education regarding Trigger Point Dry Needling: Subsequent Did patient give consent to treatment with Trigger Point Dry Needling: Yes TPDN with skilled palpation and  monitoring followed by STM to the following muscles: rt gluts, Rt lats, bil upper traps, rt levator, Rt rhomboids, Rt teres  Grip strength remained 80lb following DN today Ktape deltoid support, mcconnell tape for Rt upper trap inhibition Prone bil SIJ upper quadrant spring mobs, STM Lt piriformis, TFL, PA fem-acet mobs paired with hip rotation in prone All 4 belly lift walk. Child pose   Treatment                            5/22 :  Trigger Point Dry Needling, Manual Therapy Treatment:  Initial or subsequent education regarding Trigger Point Dry Needling: Subsequent Did patient give consent to treatment with Trigger Point Dry Needling: Yes TPDN with skilled palpation and monitoring followed by STM to the following muscles: lats, infraspinatus, teres, upper trap bil  Ktape- deltoid, educated wife on application Anchored IR/ER at 90 abd D1, D2 UE standing with 2lb     PATIENT EDUCATION: Education details: Teacher, music of condition, POC, HEP, exercise form/rationale Person educated: Patient Education method: Explanation and Verbal cues Education comprehension: verbalized understanding and needs further education   HOME EXERCISE PROGRAM: Previous HEP- [id number] PRI: Rt Sidelying clams- feet pressed into wall for LE 90/90, ball bw ankles and added adduction of Rt knee PRI: long seated supported press down with Right iliacus and psoas PRI all 4 belly lift walk Resting stance with left foot slightly back  ASSESSMENT:  CLINICAL IMPRESSION:  Rt SIJ limited mobility improved with manual therapy and released excessive low back tightness.   OBJECTIVE IMPAIRMENTS decreased activity tolerance, decreased strength, increased muscle spasms, impaired UE functional use, improper body mechanics, postural dysfunction, and pain.   ACTIVITY LIMITATIONS carrying, lifting, standing, squatting, sleeping, stairs, and locomotion level  PARTICIPATION LIMITATIONS: driving, community activity,  occupation,  and yard work  PERSONAL FACTORS Time since onset of injury/illness/exacerbation and 1 comorbidity: general hypermobility  are also affecting patient's functional outcome.     GOALS: Goals reviewed with patient? Yes  SHORT TERM GOALS: Target date: 09/16/22  Will return to daily compliance with HEP Baseline: asked that he focus on 3 PRI exercises Goal status:met   LONG TERM GOALS: Target date: POC date  Able to sleep without limitation by thigh pain Baseline: sleeping is improving, will be able to reassess when I start playing again Goal status: ongoing 1/29   2.  Able to return to pickle ball Baseline: was able to go back to playing since last visit but limited tolerance Goal status: ongoing  3.  GHJ ER strength to improve by 15% bilaterally  Baseline: see chart Goal status: ongoing    PLAN: PT FREQUENCY: 1-2x/week  PT DURATION: through May  PLANNED INTERVENTIONS: Therapeutic exercises, Therapeutic activity, Neuromuscular re-education, Balance training, Patient/Family education, Self Care, Joint mobilization, Aquatic Therapy, Dry Needling, Electrical stimulation, Spinal mobilization, Cryotherapy, Moist heat, Taping, Traction, Ionotophoresis 4mg /ml Dexamethasone, Manual therapy, and Re-evaluation  PLAN FOR NEXT SESSION: DN PRN. Upright balance with rotation, core.   Xavyer Steenson C. Maridel Pixler PT, DPT 04/20/23 11:46 AM

## 2023-04-23 ENCOUNTER — Ambulatory Visit (HOSPITAL_BASED_OUTPATIENT_CLINIC_OR_DEPARTMENT_OTHER): Payer: BC Managed Care – PPO | Admitting: Physical Therapy

## 2023-05-01 ENCOUNTER — Ambulatory Visit (HOSPITAL_BASED_OUTPATIENT_CLINIC_OR_DEPARTMENT_OTHER): Payer: BC Managed Care – PPO | Admitting: Physical Therapy

## 2023-05-01 ENCOUNTER — Encounter (HOSPITAL_BASED_OUTPATIENT_CLINIC_OR_DEPARTMENT_OTHER): Payer: Self-pay | Admitting: Physical Therapy

## 2023-05-01 DIAGNOSIS — M79605 Pain in left leg: Secondary | ICD-10-CM | POA: Diagnosis not present

## 2023-05-01 DIAGNOSIS — M5459 Other low back pain: Secondary | ICD-10-CM

## 2023-05-01 DIAGNOSIS — M542 Cervicalgia: Secondary | ICD-10-CM

## 2023-05-01 DIAGNOSIS — G8929 Other chronic pain: Secondary | ICD-10-CM

## 2023-05-01 NOTE — Therapy (Signed)
OUTPATIENT PHYSICAL THERAPY SHOULDER TREATMENT   Patient Name: Keith Montoya MRN: [id number] DOB:19-Jan-1976, 47 y.o., male Today's Date: 05/01/2023   PT End of Session - 05/01/23 1431     Visit Number 34    Date for PT Re-Evaluation 05/11/23    Authorization Type [employer] 30 VL/year    PT Start Time 1430    PT Stop Time 1512    PT Time Calculation (min) 42 min    Activity Tolerance Patient tolerated treatment well    Behavior During Therapy [facility] for tasks assessed/performed                   History reviewed. No pertinent past medical history. History reviewed. No pertinent surgical history. There are no problems to display for this patient.   REFERRING PROVIDER: Romero Belling, MD  Madelyn Brunner, DO performing injections  REFERRING DIAG:  M25.511 (ICD-10-CM) - Right shoulder pain  M67.813 (ICD-10-CM) - Other specified disorders of tendon, right shoulder  M54.2 (ICD-10-CM) - Cervicalgia  M47.896 (ICD-10-CM) - Other spondylosis, lumbar region    THERAPY DIAG:  Chronic right shoulder pain  Pain in left leg  Chronic left-sided low back pain, unspecified whether sciatica present  Other low back pain  Cervicalgia  Rationale for Evaluation and Treatment Rehabilitation  ONSET DATE: chronic  SUBJECTIVE:                                                                                                                                                                                      SUBJECTIVE STATEMENT: Was able to play 3 matches. Soreness in shoulder but no pain in hip. Continued ache in right medial knee- it popped yesterday and feels a bit better.    PERTINENT HISTORY: Rt elbow surgery 6/7 incr noted in right medial knee pain  PAIN:  Are you having pain? Yes: NPRS scale: not as bad but not great/10 Pain location: bil SIJ Pain description: tight Aggravating factors:  tennis, pickle ball Relieving factors: DN, exercises, pillow on anterior shoulder when  laying prone   PRECAUTIONS: None  WEIGHT BEARING RESTRICTIONS No  FALLS:  Has patient fallen in last 6 months? No  PLOF: Independent  PATIENT GOALS decrease pain, back to sports/fitness  OBJECTIVE:   POSTURE: Lateral tracking Lt patella in standing Superior Lt sacral quadrant anteriorly rotated on oblique axis  UPPER EXTREMITY ROM:   Gross hypermobility throughout body  EXTREMITY MMT:  MMT (lb) Right eval Left eval Rt/Lt  Rt/Lt 4/8 Rt/Lt 4/17 Rt 5/8 Rt / Lt 5/29 Rt/Lt 6/18  Shoulder flexion          Shoulder extension  Shoulder abduction          Shoulder adduction          Shoulder internal rotation          Shoulder external rotation 23 22.3 27.2 / 23.1 27.4/23.9 27.2/27.9  26.1/23.2   Knee extension 57.9 59.2 64.6 / 65.2    61.2/59.4   Grip strength     85/80 85 upright, 70 in slouch 80 in upright and slouch on Rt   (Blank rows = not tested)   JOINT MOBILITY TESTING:  hypermobility  PALPATION:  Spasm in Rt infraspinatus, lats, subscap; VMO, VL   TODAY'S TREATMENT:   Treatment                            6/18:  Trigger Point Dry Needling, Manual Therapy Treatment:  Initial or subsequent education regarding Trigger Point Dry Needling: Subsequent Did patient give consent to treatment with Trigger Point Dry Needling: Yes TPDN with skilled palpation and monitoring followed by STM to the following muscles: bil upper trap, Rt lats, infraspinatus, Rt pec minor/maj, supine Rt subscap Bil first rib depression, Rt upper sacral quadrant PA & inf spring mob; prone bil lumbar traction press by PT Ktape Rt deltoid support   Treatment                            6/7:  Trigger Point Dry Needling, Manual Therapy Treatment:  Initial or subsequent education regarding Trigger Point Dry Needling: Subsequent Did patient give consent to treatment with Trigger Point Dry Needling: No TPDN with skilled palpation and monitoring followed by STM to the following  muscles: Lt glut max/med/min; Rt lumbar paraspinals; Rt lats, infraspinatus; bil upper trap  PA and inferior spring mob to Rt upper sacral quadrant All 4 belly lift walk Child pose Hooklying figure 4 with LTR Happy baby pose Ktape rt shoulder deltoid/RC support   Treatment                            5/29:  Trigger Point Dry Needling, Manual Therapy Treatment:  Initial or subsequent education regarding Trigger Point Dry Needling: Subsequent Did patient give consent to treatment with Trigger Point Dry Needling: Yes TPDN with skilled palpation and monitoring followed by STM to the following muscles: rt gluts, Rt lats, bil upper traps, rt levator, Rt rhomboids, Rt teres  Grip strength remained 80lb following DN today Ktape deltoid support, mcconnell tape for Rt upper trap inhibition Prone bil SIJ upper quadrant spring mobs, STM Lt piriformis, TFL, PA fem-acet mobs paired with hip rotation in prone All 4 belly lift walk. Child pose    PATIENT EDUCATION: Education details: Anatomy of condition, POC, HEP, exercise form/rationale Person educated: Patient Education method: Explanation and Verbal cues Education comprehension: verbalized understanding and needs further education   HOME EXERCISE PROGRAM: Previous HEP- [id number] PRI: Rt Sidelying clams- feet pressed into wall for LE 90/90, ball bw ankles and added adduction of Rt knee PRI: long seated supported press down with Right iliacus and psoas PRI all 4 belly lift walk Resting stance with left foot slightly back  ASSESSMENT:  CLINICAL IMPRESSION:  Notable lack of mobility in Rt SIJ vs Lt- low back tightness reduced with spring mobs. Shoulder tightness decreased with motion. Will progress into more regular sport practice and will f/u in a couple of weeks for HEP advancement  and evaluation of mechanics in increased challenge.  OBJECTIVE IMPAIRMENTS decreased activity tolerance, decreased strength, increased muscle spasms, impaired  UE functional use, improper body mechanics, postural dysfunction, and pain.   ACTIVITY LIMITATIONS carrying, lifting, standing, squatting, sleeping, stairs, and locomotion level  PARTICIPATION LIMITATIONS: driving, community activity, occupation, and yard work  PERSONAL FACTORS Time since onset of injury/illness/exacerbation and 1 comorbidity: general hypermobility  are also affecting patient's functional outcome.     GOALS: Goals reviewed with patient? Yes  SHORT TERM GOALS: Target date: 09/16/22  Will return to daily compliance with HEP Baseline: asked that he focus on 3 PRI exercises Goal status:met   LONG TERM GOALS: Target date: POC date  Able to sleep without limitation by thigh pain Baseline: sleeping is improving, will be able to reassess when I start playing again Goal status: ongoing 1/29   2.  Able to return to pickle ball Baseline: was able to go back to playing since last visit but limited tolerance Goal status: ongoing  3.  GHJ ER strength to improve by 15% bilaterally  Baseline: see chart Goal status: ongoing    PLAN: PT FREQUENCY: 1-2x/week  PT DURATION: through May  PLANNED INTERVENTIONS: Therapeutic exercises, Therapeutic activity, Neuromuscular re-education, Balance training, Patient/Family education, Self Care, Joint mobilization, Aquatic Therapy, Dry Needling, Electrical stimulation, Spinal mobilization, Cryotherapy, Moist heat, Taping, Traction, Ionotophoresis 4mg /ml Dexamethasone, Manual therapy, and Re-evaluation  PLAN FOR NEXT SESSION: DN PRN. Upright balance with rotation, core.   Nollie Terlizzi C. Davide Risdon PT, DPT 05/01/23 3:16 PM

## 2023-05-03 ENCOUNTER — Encounter (HOSPITAL_BASED_OUTPATIENT_CLINIC_OR_DEPARTMENT_OTHER): Payer: Self-pay | Admitting: Physical Therapy

## 2023-05-15 ENCOUNTER — Ambulatory Visit (HOSPITAL_BASED_OUTPATIENT_CLINIC_OR_DEPARTMENT_OTHER): Payer: BC Managed Care – PPO | Admitting: Physical Therapy

## 2023-05-21 ENCOUNTER — Encounter (HOSPITAL_BASED_OUTPATIENT_CLINIC_OR_DEPARTMENT_OTHER): Payer: Self-pay | Admitting: Physical Therapy

## 2023-05-21 ENCOUNTER — Ambulatory Visit (HOSPITAL_BASED_OUTPATIENT_CLINIC_OR_DEPARTMENT_OTHER): Payer: BC Managed Care – PPO | Attending: Physical Medicine and Rehabilitation | Admitting: Physical Therapy

## 2023-05-21 DIAGNOSIS — M79605 Pain in left leg: Secondary | ICD-10-CM | POA: Insufficient documentation

## 2023-05-21 DIAGNOSIS — M25511 Pain in right shoulder: Secondary | ICD-10-CM | POA: Insufficient documentation

## 2023-05-21 DIAGNOSIS — G8929 Other chronic pain: Secondary | ICD-10-CM | POA: Insufficient documentation

## 2023-05-21 DIAGNOSIS — M545 Low back pain, unspecified: Secondary | ICD-10-CM | POA: Diagnosis present

## 2023-05-21 NOTE — Therapy (Signed)
OUTPATIENT PHYSICAL THERAPY SHOULDER TREATMENT   Patient Name: Keith Montoya MRN: [id number] DOB:June 13, 1976, 47 y.o., male Today's Date: 05/22/2023   PT End of Session - 05/21/23 1608     Visit Number 35    Date for PT Re-Evaluation 05/11/23    Authorization Type [employer] 30 VL/year    PT Start Time 1608    PT Stop Time 1642    PT Time Calculation (min) 34 min    Activity Tolerance Patient tolerated treatment well    Behavior During Therapy [facility] for tasks assessed/performed                   History reviewed. No pertinent past medical history. History reviewed. No pertinent surgical history. There are no problems to display for this patient.   REFERRING PROVIDER: Romero Belling, MD  Madelyn Brunner, DO performing injections  REFERRING DIAG:  M25.511 (ICD-10-CM) - Right shoulder pain  M67.813 (ICD-10-CM) - Other specified disorders of tendon, right shoulder  M54.2 (ICD-10-CM) - Cervicalgia  M47.896 (ICD-10-CM) - Other spondylosis, lumbar region    THERAPY DIAG:  Chronic right shoulder pain  Pain in left leg  Chronic left-sided low back pain, unspecified whether sciatica present  Rationale for Evaluation and Treatment Rehabilitation  ONSET DATE: chronic  SUBJECTIVE:                                                                                                                                                                                      SUBJECTIVE STATEMENT: Got COVID so have been very inactive for 2 weeks. Right upper trap has just been really tight.    PERTINENT HISTORY: Rt elbow surgery 6/7 incr noted in right medial knee pain  PAIN:  Are you having pain? Yes: NPRS scale: not as bad but not great/10 Pain location: bil SIJ Pain description: tight Aggravating factors:  tennis, pickle ball Relieving factors: DN, exercises, pillow on anterior shoulder when laying prone   PRECAUTIONS: None  WEIGHT BEARING RESTRICTIONS No  FALLS:  Has patient  fallen in last 6 months? No  PLOF: Independent  PATIENT GOALS decrease pain, back to sports/fitness  OBJECTIVE:   POSTURE: Lateral tracking Lt patella in standing Superior Lt sacral quadrant anteriorly rotated on oblique axis  UPPER EXTREMITY ROM:   Gross hypermobility throughout body  EXTREMITY MMT:  MMT (lb) Right eval Left eval Rt/Lt  Rt/Lt 4/8 Rt/Lt 4/17 Rt 5/8 Rt / Lt 5/29 Rt/Lt 7/8  Shoulder flexion          Shoulder extension          Shoulder abduction          Shoulder adduction  Shoulder internal rotation          Shoulder external rotation 23 22.3 27.2 / 23.1 27.4/23.9 27.2/27.9  26.1/23.2 26.8/24.6  Knee extension 57.9 59.2 64.6 / 65.2    61.2/59.4   Grip strength     85/80 85 upright, 70 in slouch 80 in upright and slouch on Rt 70/55 upright  (Blank rows = not tested)   JOINT MOBILITY TESTING:  hypermobility  PALPATION:  Spasm in Rt infraspinatus, lats, subscap; VMO, VL   TODAY'S TREATMENT:   Treatment                            7/8:  Trigger Point Dry Needling, Manual Therapy Treatment:  Initial or subsequent education regarding Trigger Point Dry Needling: Initial - education time included in Manual Did patient give consent to treatment with Trigger Point Dry Needling: Yes TPDN with skilled palpation and monitoring followed by STM to the following muscles: bil upper traps, Rt infraspinatus  Seated upper trap stretch   Treatment                            6/18:  Trigger Point Dry Needling, Manual Therapy Treatment:  Initial or subsequent education regarding Trigger Point Dry Needling: Subsequent Did patient give consent to treatment with Trigger Point Dry Needling: Yes TPDN with skilled palpation and monitoring followed by STM to the following muscles: bil upper trap, Rt lats, infraspinatus, Rt pec minor/maj, supine Rt subscap Bil first rib depression, Rt upper sacral quadrant PA & inf spring mob; prone bil lumbar traction press by  PT Ktape Rt deltoid support   Treatment                            6/7:  Trigger Point Dry Needling, Manual Therapy Treatment:  Initial or subsequent education regarding Trigger Point Dry Needling: Subsequent Did patient give consent to treatment with Trigger Point Dry Needling: No TPDN with skilled palpation and monitoring followed by STM to the following muscles: Lt glut max/med/min; Rt lumbar paraspinals; Rt lats, infraspinatus; bil upper trap  PA and inferior spring mob to Rt upper sacral quadrant All 4 belly lift walk Child pose Hooklying figure 4 with LTR Happy baby pose Ktape rt shoulder deltoid/RC support   Treatment                            5/29:  Trigger Point Dry Needling, Manual Therapy Treatment:  Initial or subsequent education regarding Trigger Point Dry Needling: Subsequent Did patient give consent to treatment with Trigger Point Dry Needling: Yes TPDN with skilled palpation and monitoring followed by STM to the following muscles: rt gluts, Rt lats, bil upper traps, rt levator, Rt rhomboids, Rt teres  Grip strength remained 80lb following DN today Ktape deltoid support, mcconnell tape for Rt upper trap inhibition Prone bil SIJ upper quadrant spring mobs, STM Lt piriformis, TFL, PA fem-acet mobs paired with hip rotation in prone All 4 belly lift walk. Child pose    PATIENT EDUCATION: Education details: Anatomy of condition, POC, HEP, exercise form/rationale Person educated: Patient Education method: Explanation and Verbal cues Education comprehension: verbalized understanding and needs further education   HOME EXERCISE PROGRAM: Previous HEP-   PRI: Rt Sidelying clams- feet pressed into wall for LE 90/90, ball bw ankles and added adduction of  Rt knee PRI: long seated supported press down with Right iliacus and psoas PRI all 4 belly lift walk Resting stance with left foot slightly back  ASSESSMENT:  CLINICAL IMPRESSION:  Patient has met his goals at  this time and is prepared for discharge to independent program.  Will continue with maintenance and encouraged to reach out with any further questions.  OBJECTIVE IMPAIRMENTS decreased activity tolerance, decreased strength, increased muscle spasms, impaired UE functional use, improper body mechanics, postural dysfunction, and pain.   ACTIVITY LIMITATIONS carrying, lifting, standing, squatting, sleeping, stairs, and locomotion level  PARTICIPATION LIMITATIONS: driving, community activity, occupation, and yard work  PERSONAL FACTORS Time since onset of injury/illness/exacerbation and 1 comorbidity: general hypermobility  are also affecting patient's functional outcome.     GOALS: Goals reviewed with patient? Yes  SHORT TERM GOALS: Target date: 09/16/22  Will return to daily compliance with HEP Baseline: asked that he focus on 3 PRI exercises Goal status:met   LONG TERM GOALS: Target date: POC date  Able to sleep without limitation by thigh pain Baseline:  Goal status: achieved   2.  Able to return to pickle ball Baseline: was able to go back to playing since last visit but limited tolerance Goal status: achieved  3.  GHJ ER strength to improve by 15% bilaterally  Baseline: see chart Goal status: achieved    PLAN: PT FREQUENCY: 1-2x/week  PT DURATION: through May  PLANNED INTERVENTIONS: Therapeutic exercises, Therapeutic activity, Neuromuscular re-education, Balance training, Patient/Family education, Self Care, Joint mobilization, Aquatic Therapy, Dry Needling, Electrical stimulation, Spinal mobilization, Cryotherapy, Moist heat, Taping, Traction, Ionotophoresis 4mg /ml Dexamethasone, Manual therapy, and Re-evaluation    Vashti Bolanos C. Asia Dusenbury PT, DPT 05/22/23 2:41 PM

## 2023-06-04 ENCOUNTER — Encounter (HOSPITAL_BASED_OUTPATIENT_CLINIC_OR_DEPARTMENT_OTHER): Payer: Self-pay | Admitting: Physical Therapy
